# Patient Record
Sex: Female | Born: 1960 | Race: White | Hispanic: No | Marital: Married | Smoking: Never smoker
Health system: Southern US, Community
[De-identification: ages and names within clinical notes are randomized; demographics above are authoritative.]

## PROBLEM LIST (undated history)

## (undated) DIAGNOSIS — I10 Essential (primary) hypertension: Secondary | ICD-10-CM

## (undated) DIAGNOSIS — J121 Respiratory syncytial virus pneumonia: Secondary | ICD-10-CM

## (undated) DIAGNOSIS — E669 Obesity, unspecified: Secondary | ICD-10-CM

## (undated) DIAGNOSIS — M797 Fibromyalgia: Secondary | ICD-10-CM

## (undated) DIAGNOSIS — J9600 Acute respiratory failure, unspecified whether with hypoxia or hypercapnia: Secondary | ICD-10-CM

## (undated) DIAGNOSIS — R7303 Prediabetes: Secondary | ICD-10-CM

## (undated) DIAGNOSIS — K219 Gastro-esophageal reflux disease without esophagitis: Secondary | ICD-10-CM

## (undated) DIAGNOSIS — M1990 Unspecified osteoarthritis, unspecified site: Secondary | ICD-10-CM

## (undated) DIAGNOSIS — N951 Menopausal and female climacteric states: Secondary | ICD-10-CM

## (undated) DIAGNOSIS — K5090 Crohn's disease, unspecified, without complications: Secondary | ICD-10-CM

## (undated) DIAGNOSIS — E119 Type 2 diabetes mellitus without complications: Secondary | ICD-10-CM

## (undated) HISTORY — DX: Crohn's disease, unspecified, without complications: K50.90

## (undated) HISTORY — DX: Respiratory syncytial virus pneumonia: J12.1

## (undated) HISTORY — DX: Essential (primary) hypertension: I10

## (undated) HISTORY — DX: Acute respiratory failure, unspecified whether with hypoxia or hypercapnia: J96.00

## (undated) HISTORY — DX: Type 2 diabetes mellitus without complications: E11.9

## (undated) HISTORY — DX: Obesity, unspecified: E66.9

## (undated) HISTORY — PX: JOINT REPLACEMENT: SHX530

## (undated) HISTORY — DX: Menopausal and female climacteric states: N95.1

---

## 1983-02-18 HISTORY — PX: ANKLE ARTHROSCOPY: SUR85

## 2009-02-17 HISTORY — PX: COLONOSCOPY: SHX174

## 2010-01-16 LAB — HM COLONOSCOPY

## 2011-01-28 DIAGNOSIS — E6609 Other obesity due to excess calories: Secondary | ICD-10-CM | POA: Insufficient documentation

## 2011-01-28 DIAGNOSIS — I10 Essential (primary) hypertension: Secondary | ICD-10-CM | POA: Insufficient documentation

## 2011-07-29 DIAGNOSIS — E785 Hyperlipidemia, unspecified: Secondary | ICD-10-CM | POA: Insufficient documentation

## 2012-02-06 DIAGNOSIS — G4700 Insomnia, unspecified: Secondary | ICD-10-CM | POA: Insufficient documentation

## 2014-03-07 DIAGNOSIS — R232 Flushing: Secondary | ICD-10-CM | POA: Insufficient documentation

## 2014-03-07 DIAGNOSIS — K219 Gastro-esophageal reflux disease without esophagitis: Secondary | ICD-10-CM | POA: Insufficient documentation

## 2014-03-07 DIAGNOSIS — N951 Menopausal and female climacteric states: Secondary | ICD-10-CM | POA: Insufficient documentation

## 2017-06-04 ENCOUNTER — Ambulatory Visit (INDEPENDENT_AMBULATORY_CARE_PROVIDER_SITE_OTHER): Payer: Managed Care, Other (non HMO) | Admitting: Physician Assistant

## 2017-06-04 ENCOUNTER — Ambulatory Visit: Payer: Self-pay | Admitting: Physician Assistant

## 2017-06-04 ENCOUNTER — Encounter: Payer: Self-pay | Admitting: Physician Assistant

## 2017-06-04 VITALS — BP 136/91 | HR 95 | Resp 14 | Ht 64.0 in | Wt 234.5 lb

## 2017-06-04 DIAGNOSIS — Z7689 Persons encountering health services in other specified circumstances: Secondary | ICD-10-CM

## 2017-06-04 DIAGNOSIS — I10 Essential (primary) hypertension: Secondary | ICD-10-CM

## 2017-06-04 DIAGNOSIS — N951 Menopausal and female climacteric states: Secondary | ICD-10-CM | POA: Diagnosis not present

## 2017-06-04 MED ORDER — ASPIRIN EC 81 MG PO TBEC
81.0000 mg | DELAYED_RELEASE_TABLET | Freq: Every day | ORAL | 3 refills | Status: DC
Start: 2017-06-04 — End: 2020-08-10

## 2017-06-04 MED ORDER — ESTRADIOL-NORETHINDRONE ACET 0.5-0.1 MG PO TABS: 1.0000 | ORAL_TABLET | Freq: Every day | ORAL | 11 refills | Status: DC

## 2017-06-04 NOTE — Progress Notes (Signed)
HPI:                                                                Megan [name] is a 57 y.o. female who presents to [facility] today to establish care  Current concerns include: hot flashes and sleep disturbance  Patient reports vasomotor symptoms that are disruptive to her sleep. Additionally reports post-menopausal depression and weight gain. Menopause occurred at age 46-51. She has tried non-hormonal therapies in the past. Citalopram was initially helpful, but has stopped working. Gabapentin "made me moody and hateful."   Depression screen [facility] 2/9 06/04/2017  Decreased Interest 0  Down, Depressed, Hopeless 0  PHQ - 2 Score 0    No flowsheet data found.    Past Medical History:  Diagnosis Date  . Acute respiratory failure ([place])   . Menopausal vasomotor syndrome   . RSV (respiratory syncytial virus pneumonia)    Past Surgical History:  Procedure Laterality Date  . NO PAST SURGERIES     Social History   Tobacco Use  . Smoking status: Never Smoker  . Smokeless tobacco: Never Used  Substance Use Topics  . Alcohol use: Never    Frequency: Never   family history includes Breast cancer in her maternal grandmother; Heart disease in her father and mother; Hypertension in her mother.    ROS: Review of Systems  Constitutional: Positive for diaphoresis.  Psychiatric/Behavioral: Positive for depression. The patient has insomnia.   All other systems reviewed and are negative.    Medications: Current Outpatient Medications  Medication Sig Dispense Refill  . hydrochlorothiazide (HYDRODIURIL) 25 MG tablet Take 25 mg by mouth daily.    . pantoprazole (PROTONIX) 40 MG tablet Take 40 mg by mouth daily.    Marland Kitchen aspirin EC 81 MG tablet Take 1 tablet (81 mg total) by mouth daily. 90 tablet 3  . Estradiol-Norethindrone Acet 0.5-0.1 MG tablet Take 1 tablet by mouth daily. 30 tablet 11   No current facility-administered medications  for this visit.    Not on File     Objective:  BP (!) 136/91   Pulse 95   Ht 5' 4"  (1.626 m)   Wt 234 lb 8 oz (106.4 kg)   SpO2 93%   BMI 40.25 kg/m  Gen:  alert, not ill-appearing, no distress, appropriate for age, obese female HEENT: head normocephalic without obvious abnormality, conjunctiva and cornea clear, trachea midline Pulm: Normal work of breathing, normal phonation, clear to auscultation bilaterally, no wheezes, rales or rhonchi CV: Normal rate, regular rhythm, s1 and s2 distinct, no murmurs, clicks or rubs  Neuro: alert and oriented x 3, no tremor MSK: extremities atraumatic, normal gait and station Skin: intact, no rashes on exposed skin, no jaundice, no cyanosis Psych: well-groomed, cooperative, good eye contact, euthymic mood, affect mood-congruent, speech is articulate, and thought processes clear and goal-directed    No results found for this or any previous visit (from the past 72 hour(s)). No results found.    Assessment and Plan: 57 y.o. female with   1. Encounter to establish care - Personally reviewed PMH, PSH, PFH, medications, allergies, HM - Age-appropriate cancer screening: mammogram UTD to be abstracted, pap UTD per patient awaiting records, colonoscopy UTD per patient awaiting records -  Influenza n/a - Tdap declined today - PHQ2 negative   2. Menopausal vasomotor syndrome - patient has failed off-label pharmacologic therapy with Citalopram and Gabapentin. discussed treatment options to include HRT, bio-identical hormones (referral), or alternative medications such as Effexor. Patient opted for HRT. Discussed risks and benefits. Mammogram UTD, no personal hx of breast cancer. No hx of VTE, liver disease or AUB - starting continuous HRT with Activella. Starting low-dose and will assess response in 3 months. Plan to d/c age 21 or sooner if needed - discussed lifestyle/non-pharmacologic management of menopausal syndrome and handout provided  3.  Essential hypertension BP Readings from Last 3 Encounters:  06/04/17 (!) 136/91  - BP out of range today - cont HCTZ 25 mg and monitor BP at home - counseled on therapeutic lifestyle changes - reassess in 3 months. Will add ACE/ARB if not at goal   Patient education and anticipatory guidance given Patient agrees with treatment plan Follow-up in 3 months or sooner as needed if symptoms worsen or fail to improve  Darlyne Russian PA-C

## 2017-06-04 NOTE — Patient Instructions (Signed)
For your blood pressure: - Goal <130/80 - baby aspirin 81 mg daily to help prevent heart attack/stroke - monitor and log blood pressures at home - check around the same time each day in a relaxed setting - Limit salt to <2000 mg/day - Follow DASH eating plan - limit alcohol to 2 standard drinks per day for men and 1 per day for women - avoid tobacco products - weight loss: 7% of current body weight - follow-up every 6 months for your blood pressure

## 2017-06-15 ENCOUNTER — Encounter: Payer: Self-pay | Admitting: Physician Assistant

## 2017-06-15 ENCOUNTER — Ambulatory Visit (INDEPENDENT_AMBULATORY_CARE_PROVIDER_SITE_OTHER): Payer: Managed Care, Other (non HMO) | Admitting: Physician Assistant

## 2017-06-15 VITALS — BP 135/88 | HR 90 | Wt 232.0 lb

## 2017-06-15 DIAGNOSIS — F325 Major depressive disorder, single episode, in full remission: Secondary | ICD-10-CM

## 2017-06-15 DIAGNOSIS — F419 Anxiety disorder, unspecified: Secondary | ICD-10-CM

## 2017-06-15 DIAGNOSIS — F411 Generalized anxiety disorder: Secondary | ICD-10-CM | POA: Diagnosis not present

## 2017-06-15 DIAGNOSIS — F329 Major depressive disorder, single episode, unspecified: Secondary | ICD-10-CM | POA: Insufficient documentation

## 2017-06-15 DIAGNOSIS — F32A Depression, unspecified: Secondary | ICD-10-CM | POA: Insufficient documentation

## 2017-06-15 DIAGNOSIS — F322 Major depressive disorder, single episode, severe without psychotic features: Secondary | ICD-10-CM | POA: Insufficient documentation

## 2017-06-15 HISTORY — DX: Major depressive disorder, single episode, in full remission: F32.5

## 2017-06-15 MED ORDER — ALPRAZOLAM 0.25 MG PO TABS: 0.2500 mg | ORAL_TABLET | Freq: Two times a day (BID) | ORAL | 0 refills | Status: DC | PRN

## 2017-06-15 MED ORDER — SERTRALINE HCL 50 MG PO TABS: 50.0000 mg | ORAL_TABLET | Freq: Every day | ORAL | 3 refills | Status: DC

## 2017-06-15 NOTE — Progress Notes (Signed)
HPI:                                                                Megan [name] is a 57 y.o. female who presents to [facility] today for anxiety  [name] reports depression with suicidal ideation, gradually worsening over the last week. She reports feeling like her family would be better off without her. She had these thoughts beginning last night and told her wife. She denies forming a plan. No history of self-harm. Denies alcohol or substance use.   She denies history of depression. She has been feeling a change in mood ever since starting menopause. She endorses anhedonia and distancing herself from loved ones. Wife reports she has noticed a huge change and encouraged her to come in today.    Depression screen [facility] 2/9 06/15/2017 06/04/2017  Decreased Interest 3 0  Down, Depressed, Hopeless 3 0  PHQ - 2 Score 6 0  Altered sleeping 1 -  Tired, decreased energy 3 -  Change in appetite 3 -  Feeling bad or failure about yourself  3 -  Trouble concentrating 3 -  Moving slowly or fidgety/restless 1 -  Suicidal thoughts 3 -  PHQ-9 Score 23 -  Difficult doing work/chores Extremely dIfficult -    GAD 7 : Generalized Anxiety Score 06/15/2017  Nervous, Anxious, on Edge 3  Control/stop worrying 3  Worry too much - different things 3  Trouble relaxing 3  Restless 3  Easily annoyed or irritable 3  Afraid - awful might happen 3  Total GAD 7 Score 21  Anxiety Difficulty Extremely difficult      Past Medical History:  Diagnosis Date  . Acute respiratory failure ([place])   . Hypertension   . Menopausal vasomotor syndrome   . Obesity   . RSV (respiratory syncytial virus pneumonia)    Past Surgical History:  Procedure Laterality Date  . NO PAST SURGERIES     Social History   Tobacco Use  . Smoking status: Never Smoker  . Smokeless tobacco: Never Used  Substance Use Topics  . Alcohol use: Never    Frequency: Never   family  history includes Breast cancer in her maternal grandmother; Heart disease in her father and mother; Hypertension in her mother.    ROS: negative except as noted in the HPI  Medications: Current Outpatient Medications  Medication Sig Dispense Refill  . aspirin EC 81 MG tablet Take 1 tablet (81 mg total) by mouth daily. 90 tablet 3  . Estradiol-Norethindrone Acet 0.5-0.1 MG tablet Take 1 tablet by mouth daily. 30 tablet 11  . hydrochlorothiazide (HYDRODIURIL) 25 MG tablet Take 25 mg by mouth daily.    . pantoprazole (PROTONIX) 40 MG tablet Take 40 mg by mouth daily.    Marland Kitchen ALPRAZolam (XANAX) 0.25 MG tablet Take 1 tablet (0.25 mg total) by mouth 2 (two) times daily as needed for anxiety. 20 tablet 0  . sertraline (ZOLOFT) 50 MG tablet Take 1 tablet (50 mg total) by mouth at bedtime. 30 tablet 3   No current facility-administered medications for this visit.    No Known Allergies     Objective:  BP 135/88   Pulse 90   Wt 232 lb (105.2 kg)  BMI 39.82 kg/m  Gen:  alert, not ill-appearing, no distress, appropriate for age 57: head normocephalic without obvious abnormality, conjunctiva and cornea clear, trachea midline Pulm: Normal work of breathing, normal phonation Neuro: alert and oriented x 3, no tremor MSK: extremities atraumatic, normal gait and station Skin: intact, no rashes on exposed skin, no jaundice, no cyanosis Psych: well-groomed, cooperative, good eye contact, depressed mood, affect mood-congruent, tearful, psychomotor agitiation, speech is articulate, and thought processes clear and goal-directed    No results found for this or any previous visit (from the past 72 hour(s)). No results found.    Assessment and Plan: 57 y.o. female with    Current severe episode of major depressive disorder without psychotic features without prior episode ([place]) - PHQ9=23, severe, passive SI, no AH/VH, no acute safety issues. Patient verbally contracted for safety. Safety  planned reviewed. She has a good support system and lives at home with wife and daughter. - starting Sertraline 25 mg QHS x 3 days, self-titrate to 50 mg QHS - low-dose alprazolam prn for anxiety/panic attacks   Current severe episode of major depressive disorder without psychotic features without prior episode ([place]) - Plan: sertraline (ZOLOFT) 50 MG tablet  Anxiety state - Plan: sertraline (ZOLOFT) 50 MG tablet, ALPRAZolam (XANAX) 0.25 MG tablet    Patient education and anticipatory guidance given Patient agrees with treatment plan Follow-up in 1 week or sooner as needed if symptoms worsen or fail to improve  I spent 25 minutes with this patient, greater than 50% was face-to-face time counseling regarding the above diagnoses   Darlyne Russian PA-C

## 2017-06-15 NOTE — Patient Instructions (Addendum)
-   start Sertraline 1/2 tablet at bedtime for 3 days, then full tablet at bedtime - take Xanax 1 tablet every 8 hours as needed for anxiety/panic attacks   Safety Plan: if having self-harm or suicidal thoughts 1. [place]  2. [place]  3. Evlyn Clines [phone] [place] National Suicide Hotline 1-800-SUICIDE If in immediate danger of harming yourself, go to the nearest emergency room or call 911    Counseling: - psychologytoday.com: search engine to locate local counselors - Family Services in your county offer counseling on a sliding scale (pay what you can afford) - [facility]: we can place a referral for you to see one of licensed counselors in [place], [place], or [place] - online counseling: [facility] (not covered by insurance, but affordable self-pay rates)  Other resources: - https://www.[place].net/ - [employer]cup[employer]a - ArmyDictionary.fi

## 2017-06-19 ENCOUNTER — Telehealth: Payer: Self-pay | Admitting: Physician Assistant

## 2017-06-19 NOTE — Telephone Encounter (Signed)
Spoke with Megan [name] at 11:43 am today. Reports she is feeling significantly better. She is taking Sertraline nightly without issues. Denies suicidal ideation. Requesting to move her follow-up appointment forward. Transferred to reschedule for 2-3 weeks.

## 2017-06-22 ENCOUNTER — Ambulatory Visit: Payer: Managed Care, Other (non HMO) | Admitting: Physician Assistant

## 2017-06-26 ENCOUNTER — Telehealth: Payer: Self-pay | Admitting: Physician Assistant

## 2017-06-26 NOTE — Telephone Encounter (Signed)
Please ask Megan [name] if she is taking her Protonix for acid reflux/GERD? She should really only be on the 52m dose for acid reflux. High dose is reserved for barrett's esophagus, esophagitis, gastritis and ulcers  I'm okay with refilling 20 mg #90 1 refill

## 2017-06-26 NOTE — Telephone Encounter (Signed)
Please advise -EH/RMA

## 2017-06-29 NOTE — Telephone Encounter (Signed)
Patient should attempt to reduce dose to 20 mg, she can do 1 tablet at bedtime or before breakfast. Adhere to GERD diet Recommend against using high doses long-term  We can discuss at her next follow-up visit Please send 20 mg #90, 1 refill

## 2017-06-29 NOTE — Telephone Encounter (Signed)
Pt stated that she has GERD throughout the day and it can become worse at night.  She said that her previous PCP said for her to take 1 in the morning and 1 at bedtime.  She said this dose seemed to help.  Please advise. -EH/RMA

## 2017-06-30 ENCOUNTER — Other Ambulatory Visit: Payer: Self-pay

## 2017-06-30 DIAGNOSIS — F411 Generalized anxiety disorder: Secondary | ICD-10-CM

## 2017-06-30 MED ORDER — PANTOPRAZOLE SODIUM 20 MG PO TBEC: 20.0000 mg | DELAYED_RELEASE_TABLET | Freq: Every day | ORAL | 1 refills | Status: DC

## 2017-06-30 NOTE — Telephone Encounter (Signed)
Pt notified. New Rx sent to pharmacy [facility]

## 2017-07-06 ENCOUNTER — Ambulatory Visit: Payer: Managed Care, Other (non HMO) | Admitting: Physician Assistant

## 2017-07-06 ENCOUNTER — Encounter: Payer: Self-pay | Admitting: Physician Assistant

## 2017-07-06 ENCOUNTER — Telehealth: Payer: Self-pay | Admitting: Physician Assistant

## 2017-07-06 VITALS — BP 128/85 | HR 80 | Wt 232.0 lb

## 2017-07-06 DIAGNOSIS — F325 Major depressive disorder, single episode, in full remission: Secondary | ICD-10-CM | POA: Diagnosis not present

## 2017-07-06 DIAGNOSIS — Z5181 Encounter for therapeutic drug level monitoring: Secondary | ICD-10-CM | POA: Diagnosis not present

## 2017-07-06 DIAGNOSIS — Z79899 Other long term (current) drug therapy: Secondary | ICD-10-CM | POA: Diagnosis not present

## 2017-07-06 DIAGNOSIS — K219 Gastro-esophageal reflux disease without esophagitis: Secondary | ICD-10-CM | POA: Diagnosis not present

## 2017-07-06 DIAGNOSIS — Z1159 Encounter for screening for other viral diseases: Secondary | ICD-10-CM | POA: Diagnosis not present

## 2017-07-06 DIAGNOSIS — Z13220 Encounter for screening for lipoid disorders: Secondary | ICD-10-CM

## 2017-07-06 DIAGNOSIS — Z1329 Encounter for screening for other suspected endocrine disorder: Secondary | ICD-10-CM | POA: Diagnosis not present

## 2017-07-06 DIAGNOSIS — Z130 Encounter for screening for diseases of the blood and blood-forming organs and certain disorders involving the immune mechanism: Secondary | ICD-10-CM | POA: Diagnosis not present

## 2017-07-06 DIAGNOSIS — I10 Essential (primary) hypertension: Secondary | ICD-10-CM | POA: Diagnosis not present

## 2017-07-06 MED ORDER — HYDROCHLOROTHIAZIDE 25 MG PO TABS: 25.0000 mg | ORAL_TABLET | Freq: Every day | ORAL | 1 refills | Status: DC

## 2017-07-06 MED ORDER — SERTRALINE HCL 50 MG PO TABS: 50.0000 mg | ORAL_TABLET | Freq: Every day | ORAL | 1 refills | Status: DC

## 2017-07-06 MED ORDER — DEXLANSOPRAZOLE 30 MG PO CPDR: 30.0000 mg | DELAYED_RELEASE_CAPSULE | Freq: Every day | ORAL | 5 refills | Status: DC

## 2017-07-06 NOTE — Patient Instructions (Addendum)
- switch Pantoprazole dosing to bedtime - try elevating the head of the bed at night - you can try Gaviscon for breakthrough symptoms (if TUMS is not cutting it) - follow GERD diet  Food Choices for Gastroesophageal Reflux Disease, Adult When you have gastroesophageal reflux disease (GERD), the foods you eat and your eating habits are very important. Choosing the right foods can help ease the discomfort of GERD. Consider working with a diet and nutrition specialist (dietitian) to help you make healthy food choices. What general guidelines should I follow? Eating plan  Choose healthy foods low in fat, such as fruits, vegetables, whole grains, low-fat dairy products, and lean meat, fish, and poultry.  Eat frequent, small meals instead of three large meals each day. Eat your meals slowly, in a relaxed setting. Avoid bending over or lying down until 2-3 hours after eating.  Limit high-fat foods such as fatty meats or fried foods.  Limit your intake of oils, butter, and shortening to less than 8 teaspoons each day.  Avoid the following: ? Foods that cause symptoms. These may be different for different people. Keep a food diary to keep track of foods that cause symptoms. ? Alcohol. ? Drinking large amounts of liquid with meals. ? Eating meals during the 2-3 hours before bed.  Cook foods using methods other than frying. This may include baking, grilling, or broiling. Lifestyle   Maintain a healthy weight. Ask your health care provider what weight is healthy for you. If you need to lose weight, work with your health care provider to do so safely.  Exercise for at least 30 minutes on 5 or more days each week, or as told by your health care provider.  Avoid wearing clothes that fit tightly around your waist and chest.  Do not use any products that contain nicotine or tobacco, such as cigarettes and e-cigarettes. If you need help quitting, ask your health care provider.  Sleep with the head  of your bed raised. Use a wedge under the mattress or blocks under the bed frame to raise the head of the bed. What foods are not recommended? The items listed may not be a complete list. Talk with your dietitian about what dietary choices are best for you. Grains Pastries or quick breads with added fat. [place] toast. Vegetables Deep fried vegetables. [place] fries. Any vegetables prepared with added fat. Any vegetables that cause symptoms. For some people this may include tomatoes and tomato products, chili peppers, onions and garlic, and horseradish. Fruits Any fruits prepared with added fat. Any fruits that cause symptoms. For some people this may include citrus fruits, such as oranges, grapefruit, pineapple, and lemons. Meats and other protein foods High-fat meats, such as fatty beef or pork, hot dogs, ribs, ham, sausage, salami and bacon. Fried meat or protein, including fried fish and fried chicken. Nuts and nut butters. Dairy Whole milk and chocolate milk. Sour cream. Cream. Ice cream. Cream cheese. Milk shakes. Beverages Coffee and tea, with or without caffeine. Carbonated beverages. Sodas. Energy drinks. Fruit juice made with acidic fruits (such as orange or grapefruit). Tomato juice. Alcoholic drinks. Fats and oils Butter. Margarine. Shortening. Ghee. Sweets and desserts Chocolate and cocoa. Donuts. Seasoning and other foods Pepper. Peppermint and spearmint. Any condiments, herbs, or seasonings that cause symptoms. For some people, this may include curry, hot sauce, or vinegar-based salad dressings. Summary  When you have gastroesophageal reflux disease (GERD), food and lifestyle choices are very important to help ease the discomfort of  GERD.  Eat frequent, small meals instead of three large meals each day. Eat your meals slowly, in a relaxed setting. Avoid bending over or lying down until 2-3 hours after eating.  Limit high-fat foods such as fatty meat or fried foods. This  information is not intended to replace advice given to you by your health care provider. Make sure you discuss any questions you have with your health care provider. Document Released: 02/03/2005 Document Revised: 02/05/2016 Document Reviewed: 02/05/2016 Elsevier Interactive Patient Education  [employer].

## 2017-07-06 NOTE — Progress Notes (Signed)
HPI:                                                                Megan [name] is a 57 y.o. female who presents to [facility] [place] [facility] today for depression follow-up  Depression/Anxiety: taking sertraline 50 mg without difficulty. Has not need to take any Xanax. Symptoms are well controlled. Reports mood swings have resolved and she is no longer feeling depressed. Denies symptoms of mania/hypomania. Denies suicidal thinking. Denies auditory/visual hallucinations.  Menopausal vasomotor symptoms: has been taking oral estradiol-norethindrone daily for approximately 4 weeks. Denies any hot flashes/nightsweats. Sleeping much better.  GERD: she has been taking Protonix 40 mg daily for years for acid reflux. This has controlled her symptoms well. Recently I reduced her dose to 20 mg daily. Since then she reports burning epigastric pain and regurgitation of stomach acids, worse lying down at night. Taking TUMS throughout the day for breakthrough symptoms. Reports she had a normal EGD years ago. Denies constitutional symptoms, nausea, forceful vomiting, hematochezia/melena.  Depression screen [facility] 2/9 07/06/2017 06/15/2017 06/04/2017  Decreased Interest 0 3 0  Down, Depressed, Hopeless 0 3 0  PHQ - 2 Score 0 6 0  Altered sleeping 0 1 -  Tired, decreased energy 0 3 -  Change in appetite 0 3 -  Feeling bad or failure about yourself  0 3 -  Trouble concentrating 0 3 -  Moving slowly or fidgety/restless 0 1 -  Suicidal thoughts 0 3 -  PHQ-9 Score 0 23 -  Difficult doing work/chores Not difficult at all Extremely dIfficult -    GAD 7 : Generalized Anxiety Score 07/06/2017 06/15/2017  Nervous, Anxious, on Edge 0 3  Control/stop worrying 0 3  Worry too much - different things 0 3  Trouble relaxing 0 3  Restless 0 3  Easily annoyed or irritable 0 3  Afraid - awful might happen 0 3  Total GAD 7 Score 0 21  Anxiety Difficulty Not difficult at all Extremely  difficult      Past Medical History:  Diagnosis Date  . Acute respiratory failure ([place])   . Hypertension   . Menopausal vasomotor syndrome   . Obesity   . RSV (respiratory syncytial virus pneumonia)    Past Surgical History:  Procedure Laterality Date  . NO PAST SURGERIES     Social History   Tobacco Use  . Smoking status: Never Smoker  . Smokeless tobacco: Never Used  Substance Use Topics  . Alcohol use: Never    Frequency: Never   family history includes Breast cancer in her maternal grandmother; Heart disease in her father and mother; Hypertension in her mother.    ROS: negative except as noted in the HPI  Medications: Current Outpatient Medications  Medication Sig Dispense Refill  . aspirin EC 81 MG tablet Take 1 tablet (81 mg total) by mouth daily. 90 tablet 3  . Estradiol-Norethindrone Acet 0.5-0.1 MG tablet Take 1 tablet by mouth daily. 30 tablet 11  . hydrochlorothiazide (HYDRODIURIL) 25 MG tablet Take 1 tablet (25 mg total) by mouth daily. 90 tablet 1  . pantoprazole (PROTONIX) 20 MG tablet Take 1 tablet (20 mg total) by mouth daily. 90 tablet 1  . sertraline (ZOLOFT) 50 MG  tablet Take 1 tablet (50 mg total) by mouth at bedtime. 90 tablet 1  . Dexlansoprazole 30 MG capsule Take 1 capsule (30 mg total) by mouth daily. 30 capsule 5   No current facility-administered medications for this visit.    No Known Allergies     Objective:  BP 128/85   Pulse 80   Wt 232 lb (105.2 kg)   BMI 39.82 kg/m  Gen:  alert, not ill-appearing, no distress, appropriate for age, obese female HEENT: head normocephalic without obvious abnormality, conjunctiva and cornea clear, wearing glasses, trachea midline Pulm: Normal work of breathing, normal phonation Neuro: alert and oriented x 3, no tremor MSK: extremities atraumatic, normal gait and station Skin: intact, no rashes on exposed skin, no jaundice, no cyanosis Psych: well-groomed, cooperative, good eye contact, euthymic  mood, affect mood-congruent, speech is articulate, and thought processes clear and goal-directed    No results found for this or any previous visit (from the past 72 hour(s)). No results found.    Assessment and Plan: 57 y.o. female with   Major depressive disorder with single episode, in full remission ([place]) - Plan: sertraline (ZOLOFT) 50 MG tablet  Gastroesophageal reflux disease without esophagitis - Plan: Dexlansoprazole 30 MG capsule  Essential hypertension - Plan: hydrochlorothiazide (HYDRODIURIL) 25 MG tablet, COMPLETE METABOLIC PANEL WITH GFR  Encounter for hepatitis C screening test for low risk patient - Plan: Hepatitis C antibody  Screening for thyroid disorder - Plan: TSH + free T4  Screening for lipid disorders - Plan: Lipid Panel w/reflex Direct LDL  Encounter for monitoring diuretic therapy - Plan: COMPLETE METABOLIC PANEL WITH GFR, CBC  Screening for blood disease - Plan: CBC  MDD in remission - PHQ9=0 today - she had a great response to Sertraline 50 mg. She also started HRT around the same time. We will continue both  GERD - we had a detailed discussion about avoiding long-term high dose PPI use for GERD/heartburn. She is not having any red flag symptoms, but she is not controlled on Protonix 20 mg. Prior to this, she failed Omeprazole. Discussed that we will trial Dexilant. If symptoms are not controlled, then I will refer her to GI - GERD diet and general measures  HTN BP Readings from Last 3 Encounters:  07/06/17 128/85  06/15/17 135/88  06/04/17 (!) 937/90  - diastolic BP still out of range - cont HCTZ 25 mg - counseled on therapeutic lifestyle changes. Monitor BP at home. Follow-up in 3 months   Patient education and anticipatory guidance given Patient agrees with treatment plan Follow-up in 3 months for medication management or sooner as needed if symptoms worsen or fail to improve  Darlyne Russian PA-C

## 2017-07-06 NOTE — Telephone Encounter (Signed)
Can you contact Megan [name] and have her go to the lab one day this week for routine labs? I refilled her blood pressure medicine, but I would like to monitor her kidney function and electrolytes Order Req is at the front desk

## 2017-07-06 NOTE — Telephone Encounter (Signed)
Pt advised.

## 2017-07-08 ENCOUNTER — Other Ambulatory Visit: Payer: Self-pay | Admitting: Physician Assistant

## 2017-07-08 ENCOUNTER — Encounter: Payer: Self-pay | Admitting: Physician Assistant

## 2017-07-08 DIAGNOSIS — R739 Hyperglycemia, unspecified: Secondary | ICD-10-CM

## 2017-07-08 NOTE — Progress Notes (Signed)
A1C was added. W.Khayden Herzberg, CCMA

## 2017-07-08 NOTE — Progress Notes (Signed)
Good afternoon Megan [name],  Your labs look good overall! - your blood sugar is mildly increased, this could be due to not fasting or it could signal early insulin resistance/ pre-diabetes. Recommend we get a routine hemoglobin A1c test at your next office visit. Also recommend limiting carbohydrates to 30 g per meal and 15 g per snack, regular aerobic exercise, and weight loss.  Best, Evlyn Clines

## 2017-07-09 ENCOUNTER — Encounter: Payer: Self-pay | Admitting: Physician Assistant

## 2017-07-09 DIAGNOSIS — R7303 Prediabetes: Secondary | ICD-10-CM | POA: Insufficient documentation

## 2017-07-09 DIAGNOSIS — N939 Abnormal uterine and vaginal bleeding, unspecified: Secondary | ICD-10-CM

## 2017-07-09 LAB — HEPATITIS C ANTIBODY
HEP C AB: NONREACTIVE
SIGNAL TO CUT-OFF: 0.01 (ref <1.00)

## 2017-07-09 LAB — CBC
HCT: 37.6 % (ref 35.0–45.0)
HEMOGLOBIN: 12.3 g/dL (ref 11.7–15.5)
MCH: 26.4 pg — AB (ref 27.0–33.0)
MCHC: 32.7 g/dL (ref 32.0–36.0)
MCV: 80.7 fL (ref 80.0–100.0)
MPV: 11.8 fL (ref 7.5–12.5)
Platelets: 228 10*3/uL (ref 140–400)
RBC: 4.66 10*6/uL (ref 3.80–5.10)
RDW: 14.2 % (ref 11.0–15.0)
WBC: 4.4 10*3/uL (ref 3.8–10.8)

## 2017-07-09 LAB — TEST AUTHORIZATION

## 2017-07-09 LAB — COMPLETE METABOLIC PANEL WITH GFR
AG Ratio: 1.5 (calc) (ref 1.0–2.5)
ALKALINE PHOSPHATASE (APISO): 77 U/L (ref 33–130)
ALT: 18 U/L (ref 6–29)
AST: 19 U/L (ref 10–35)
Albumin: 4.1 g/dL (ref 3.6–5.1)
BUN: 14 mg/dL (ref 7–25)
CALCIUM: 9.3 mg/dL (ref 8.6–10.4)
CO2: 29 mmol/L (ref 20–32)
CREATININE: 0.76 mg/dL (ref 0.50–1.05)
Chloride: 100 mmol/L (ref 98–110)
GFR, EST NON AFRICAN AMERICAN: 87 mL/min/{1.73_m2} (ref >=60)
GFR, Est African American: 101 mL/min/{1.73_m2} (ref >=60)
GLOBULIN: 2.8 g/dL (ref 1.9–3.7)
GLUCOSE: 107 mg/dL — AB (ref 65–99)
Potassium: 3.7 mmol/L (ref 3.5–5.3)
SODIUM: 138 mmol/L (ref 135–146)
Total Bilirubin: 0.5 mg/dL (ref 0.2–1.2)
Total Protein: 6.9 g/dL (ref 6.1–8.1)

## 2017-07-09 LAB — TSH+FREE T4: TSH W/REFLEX TO FT4: 1.7 mIU/L (ref 0.40–4.50)

## 2017-07-09 LAB — LIPID PANEL W/REFLEX DIRECT LDL
Cholesterol: 211 mg/dL — ABNORMAL HIGH (ref <200)
HDL: 71 mg/dL (ref >50)
LDL Cholesterol (Calc): 106 mg/dL (calc) — ABNORMAL HIGH
NON-HDL CHOLESTEROL (CALC): 140 mg/dL — AB (ref <130)
TRIGLYCERIDES: 217 mg/dL — AB (ref <150)
Total CHOL/HDL Ratio: 3 (calc) (ref <5.0)

## 2017-07-09 LAB — HEMOGLOBIN A1C W/OUT EAG: HEMOGLOBIN A1C: 5.8 %{Hb} — AB (ref <5.7)

## 2017-07-09 NOTE — Progress Notes (Signed)
Hi Megan [name],  I was able to add-on a Hemoglobin-A1c test to your existing bloodwork. You do have pre-diabetes - this indicates insulin resistance in the body and increases risk of developing Type 2 Diabetes. I recommend - following dietary guidelines of the American Diabetes Association (ADA) - limit carbs to 30g per meal and 15g per snack - avoid sweetened beverages and simple carbs like sugar, white bread and white pasta - regular aerobic exercise - losing 5% of current body weight  Best, Evlyn Clines

## 2017-07-16 MED ORDER — MEDROXYPROGESTERONE ACETATE 10 MG PO TABS: 10.0000 mg | ORAL_TABLET | Freq: Two times a day (BID) | ORAL | 0 refills | Status: DC

## 2017-07-17 ENCOUNTER — Ambulatory Visit (INDEPENDENT_AMBULATORY_CARE_PROVIDER_SITE_OTHER): Payer: Managed Care, Other (non HMO)

## 2017-07-17 DIAGNOSIS — N939 Abnormal uterine and vaginal bleeding, unspecified: Secondary | ICD-10-CM | POA: Diagnosis not present

## 2017-07-17 DIAGNOSIS — D259 Leiomyoma of uterus, unspecified: Secondary | ICD-10-CM | POA: Insufficient documentation

## 2017-07-17 NOTE — Progress Notes (Unsigned)
Spoke with patient on the phone regarding pelvic ultrasound results showing fibroid tumor Referring to OB/GYN to discuss surgical options Continue full 5 days of Provera Hold HRT for now until eval with GYN

## 2017-07-24 ENCOUNTER — Other Ambulatory Visit: Payer: Self-pay | Admitting: Physician Assistant

## 2017-07-24 DIAGNOSIS — N939 Abnormal uterine and vaginal bleeding, unspecified: Secondary | ICD-10-CM

## 2017-07-26 ENCOUNTER — Encounter: Payer: Self-pay | Admitting: Obstetrics & Gynecology

## 2017-07-27 ENCOUNTER — Ambulatory Visit: Payer: 59 | Admitting: Obstetrics & Gynecology

## 2017-07-27 ENCOUNTER — Encounter: Payer: Self-pay | Admitting: Physician Assistant

## 2017-07-27 ENCOUNTER — Encounter: Payer: Self-pay | Admitting: Obstetrics & Gynecology

## 2017-07-27 VITALS — BP 122/81 | HR 93 | Resp 16 | Ht 64.0 in | Wt 223.0 lb

## 2017-07-27 DIAGNOSIS — Z124 Encounter for screening for malignant neoplasm of cervix: Secondary | ICD-10-CM | POA: Diagnosis not present

## 2017-07-27 DIAGNOSIS — N950 Postmenopausal bleeding: Secondary | ICD-10-CM

## 2017-07-27 DIAGNOSIS — Z1151 Encounter for screening for human papillomavirus (HPV): Secondary | ICD-10-CM | POA: Diagnosis not present

## 2017-07-27 DIAGNOSIS — Z01419 Encounter for gynecological examination (general) (routine) without abnormal findings: Secondary | ICD-10-CM

## 2017-07-27 MED ORDER — ALPRAZOLAM ER 1 MG PO TB24: ORAL_TABLET | ORAL | 0 refills | Status: DC

## 2017-07-27 MED ORDER — MEGESTROL ACETATE 40 MG PO TABS: 40.0000 mg | ORAL_TABLET | Freq: Two times a day (BID) | ORAL | 5 refills | Status: DC

## 2017-07-27 MED ORDER — MISOPROSTOL 200 MCG PO TABS: ORAL_TABLET | ORAL | 0 refills | Status: DC

## 2017-07-27 NOTE — Progress Notes (Signed)
Subjective:    Megan [name] is a 57 y.o. married P0  female who presents for an annual exam. She reports heavy bleeding since Friday (3 days ago). She was given Activella 4/19. She stopped the Mora when she started PMB and tried provera.  She reports menopause around age 39. The patient is sexually active. GYN screening history: last pap: was normal. The patient wears seatbelts: yes. The patient participates in regular exercise: yes. Has the patient ever been transfused or tattooed?: no. The patient reports that there is not domestic violence in her life.   Menstrual History: OB History    Gravida  0   Para  0   Term  0   Preterm  0   AB  0   Living  0     SAB  0   TAB  0   Ectopic  0   Multiple  0   Live Births  0           Menarche age: 89 No LMP recorded. Patient is postmenopausal.    The following portions of the patient's history were reviewed and updated as appropriate: allergies, current medications, past family history, past medical history, past social history, past surgical history and problem list.  Review of Systems Pertinent items are noted in HPI.   She had a mammo 12/18 through [facility] and it was normal. 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) FH- + breast cancer in maternal GM + cervical cancer in her sister   Objective:    BP 122/81   Pulse 93   Resp 16   Ht 5' 4"  (1.626 m)   Wt 223 lb (101.2 kg)   BMI 38.28 kg/m   General Appearance:    Alert, cooperative, no distress, appears stated age  Head:    Normocephalic, without obvious abnormality, atraumatic  Eyes:    PERRL, conjunctiva/corneas clear, EOM's intact, fundi    benign, both eyes  Ears:    Normal TM's and external ear canals, both ears  Nose:   Nares normal, septum midline, mucosa normal, no drainage    or sinus tenderness  Throat:   Lips,  mucosa, and tongue normal; teeth and gums normal  Neck:   Supple, symmetrical, trachea midline, no adenopathy;    thyroid:  no enlargement/tenderness/nodules; no carotid   bruit or JVD  Back:     Symmetric, no curvature, ROM normal, no CVA tenderness  Lungs:     Clear to auscultation bilaterally, respirations unlabored  Chest Wall:    No tenderness or deformity   Heart:    Regular rate and rhythm, S1 and S2 normal, no murmur, rub   or gallop  Breast Exam:    No tenderness, masses, or nipple abnormality  Abdomen:     Soft, non-tender, bowel sounds active all four quadrants,    no masses, no organomegaly  Genitalia:    Normal female without lesion, discharge or tenderness, normal size and shape, anteverted, mobile, non-tender, normal adnexal exam, small amount of blood noted      Extremities:   Extremities normal, atraumatic, no cyanosis or edema  Pulses:   2+ and symmetric all extremities  Skin:   Skin color, texture, turgor normal, no rashes or lesions  Lymph nodes:   Cervical, supraclavicular, and axillary nodes normal  Neurologic:   CNII-XII intact, normal strength,  sensation and reflexes    throughout  .    Assessment:    Healthy female exam.   PMB with 7 mm endomtrium  nocturia x 1-2 times per night ( she does not drink anything after 4 pm)- rec voiding diary, urol ref prn Plan:     Thin prep Pap smear. with cotesting Schedule EMBX, pretreat with cytotec and xanax

## 2017-07-30 LAB — CYTOLOGY - PAP
Diagnosis: NEGATIVE
HPV: NOT DETECTED

## 2017-08-03 ENCOUNTER — Other Ambulatory Visit: Payer: Self-pay | Admitting: Obstetrics & Gynecology

## 2017-08-03 ENCOUNTER — Encounter: Payer: Self-pay | Admitting: Obstetrics & Gynecology

## 2017-08-11 ENCOUNTER — Encounter: Payer: Self-pay | Admitting: Obstetrics & Gynecology

## 2017-08-12 ENCOUNTER — Encounter: Payer: Self-pay | Admitting: Obstetrics & Gynecology

## 2017-08-12 ENCOUNTER — Other Ambulatory Visit: Payer: Self-pay

## 2017-08-12 ENCOUNTER — Encounter (HOSPITAL_BASED_OUTPATIENT_CLINIC_OR_DEPARTMENT_OTHER): Payer: Self-pay | Admitting: *Deleted

## 2017-08-12 ENCOUNTER — Ambulatory Visit: Payer: Managed Care, Other (non HMO) | Admitting: Obstetrics & Gynecology

## 2017-08-12 VITALS — BP 128/89 | HR 84 | Ht 64.0 in | Wt 222.0 lb

## 2017-08-12 DIAGNOSIS — N950 Postmenopausal bleeding: Secondary | ICD-10-CM | POA: Diagnosis not present

## 2017-08-12 MED ORDER — MISOPROSTOL 200 MCG PO TABS: ORAL_TABLET | ORAL | 0 refills | Status: DC

## 2017-08-12 NOTE — Progress Notes (Signed)
   Subjective:    Patient ID: Megan [name], female    DOB: 1960/05/21, 57 y.o.   MRN: [id number]  HPI 57 yo married P0 here for a EMBX due to PMB.    Review of Systems     Objective:   Physical Exam Breathing, conversing, and ambulating normally Well nourished, well hydrated White female, no apparent distress  She was unable to tolerate even having the speculum opened.     Assessment & Plan:  PMB- schedule d&c this Friday Pretreat with cytotec

## 2017-08-14 ENCOUNTER — Encounter (HOSPITAL_BASED_OUTPATIENT_CLINIC_OR_DEPARTMENT_OTHER)
Admission: RE | Disposition: A | Discharge status: Home / Self Care | Payer: Self-pay | Source: Ambulatory Visit | Attending: Obstetrics & Gynecology

## 2017-08-14 ENCOUNTER — Ambulatory Visit (HOSPITAL_BASED_OUTPATIENT_CLINIC_OR_DEPARTMENT_OTHER): Payer: 59 | Admitting: Certified Registered"

## 2017-08-14 ENCOUNTER — Ambulatory Visit (HOSPITAL_BASED_OUTPATIENT_CLINIC_OR_DEPARTMENT_OTHER)
Admission: RE | Admit: 2017-08-14 | Discharge: 2017-08-14 | Disposition: A | Discharge status: Home / Self Care | Payer: 59 | Source: Ambulatory Visit | Attending: Obstetrics & Gynecology | Admitting: Obstetrics & Gynecology

## 2017-08-14 ENCOUNTER — Other Ambulatory Visit: Payer: Self-pay

## 2017-08-14 ENCOUNTER — Encounter (HOSPITAL_BASED_OUTPATIENT_CLINIC_OR_DEPARTMENT_OTHER): Payer: Self-pay | Admitting: *Deleted

## 2017-08-14 DIAGNOSIS — E669 Obesity, unspecified: Secondary | ICD-10-CM | POA: Diagnosis not present

## 2017-08-14 DIAGNOSIS — Z79899 Other long term (current) drug therapy: Secondary | ICD-10-CM | POA: Insufficient documentation

## 2017-08-14 DIAGNOSIS — K219 Gastro-esophageal reflux disease without esophagitis: Secondary | ICD-10-CM | POA: Diagnosis not present

## 2017-08-14 DIAGNOSIS — Z6838 Body mass index (BMI) 38.0-38.9, adult: Secondary | ICD-10-CM | POA: Diagnosis not present

## 2017-08-14 DIAGNOSIS — N840 Polyp of corpus uteri: Secondary | ICD-10-CM | POA: Diagnosis not present

## 2017-08-14 DIAGNOSIS — N950 Postmenopausal bleeding: Secondary | ICD-10-CM

## 2017-08-14 DIAGNOSIS — I10 Essential (primary) hypertension: Secondary | ICD-10-CM | POA: Diagnosis not present

## 2017-08-14 DIAGNOSIS — Z7982 Long term (current) use of aspirin: Secondary | ICD-10-CM | POA: Insufficient documentation

## 2017-08-14 HISTORY — PX: DILATION AND CURETTAGE OF UTERUS: SHX78

## 2017-08-14 HISTORY — DX: Gastro-esophageal reflux disease without esophagitis: K21.9

## 2017-08-14 HISTORY — DX: Prediabetes: R73.03

## 2017-08-14 LAB — POCT I-STAT, CHEM 8
BUN: 11 mg/dL (ref 6–20)
Calcium, Ion: 1.13 mmol/L — ABNORMAL LOW (ref 1.15–1.40)
Chloride: 101 mmol/L (ref 98–111)
Creatinine, Ser: 0.7 mg/dL (ref 0.44–1.00)
Glucose, Bld: 98 mg/dL (ref 70–99)
HCT: 38 % (ref 36.0–46.0)
Hemoglobin: 12.9 g/dL (ref 12.0–15.0)
Potassium: 2.9 mmol/L — ABNORMAL LOW (ref 3.5–5.1)
Sodium: 141 mmol/L (ref 135–145)
TCO2: 26 mmol/L (ref 22–32)

## 2017-08-14 SURGERY — DILATION AND CURETTAGE
Anesthesia: General | Site: Vagina

## 2017-08-14 MED ORDER — BUPIVACAINE HCL (PF) 0.25 % IJ SOLN
INTRAMUSCULAR | Status: AC
  Filled 2017-08-14: qty 30

## 2017-08-14 MED ORDER — ONDANSETRON HCL 4 MG/2ML IJ SOLN
INTRAMUSCULAR | Status: DC | PRN
  Administered 2017-08-14: 4 mg via INTRAVENOUS

## 2017-08-14 MED ORDER — PROPOFOL 10 MG/ML IV BOLUS
INTRAVENOUS | Status: DC | PRN
  Administered 2017-08-14: 150 mg via INTRAVENOUS

## 2017-08-14 MED ORDER — FENTANYL CITRATE (PF) 100 MCG/2ML IJ SOLN
INTRAMUSCULAR | Status: AC
  Filled 2017-08-14: qty 2

## 2017-08-14 MED ORDER — IBUPROFEN 600 MG PO TABS: 600.0000 mg | ORAL_TABLET | Freq: Four times a day (QID) | ORAL | 1 refills | Status: DC | PRN

## 2017-08-14 MED ORDER — PROMETHAZINE HCL 25 MG/ML IJ SOLN
6.2500 mg | INTRAMUSCULAR | Status: DC | PRN
Start: 2017-08-14 — End: 2017-08-14

## 2017-08-14 MED ORDER — FENTANYL CITRATE (PF) 100 MCG/2ML IJ SOLN: 25.0000 ug | INTRAMUSCULAR | Status: DC | PRN

## 2017-08-14 MED ORDER — SUCCINYLCHOLINE CHLORIDE 20 MG/ML IJ SOLN
INTRAMUSCULAR | Status: DC | PRN
  Administered 2017-08-14: 140 mg via INTRAVENOUS

## 2017-08-14 MED ORDER — BUPIVACAINE HCL (PF) 0.5 % IJ SOLN
INTRAMUSCULAR | Status: AC
Start: 2017-08-14 — End: ?
  Filled 2017-08-14: qty 30

## 2017-08-14 MED ORDER — LIDOCAINE-EPINEPHRINE (PF) 1 %-1:200000 IJ SOLN
INTRAMUSCULAR | Status: AC
  Filled 2017-08-14: qty 30

## 2017-08-14 MED ORDER — DEXAMETHASONE SODIUM PHOSPHATE 10 MG/ML IJ SOLN
INTRAMUSCULAR | Status: AC
  Filled 2017-08-14: qty 1

## 2017-08-14 MED ORDER — MIDAZOLAM HCL 2 MG/2ML IJ SOLN
1.0000 mg | INTRAMUSCULAR | Status: DC | PRN
  Administered 2017-08-14: 2 mg via INTRAVENOUS

## 2017-08-14 MED ORDER — OXYCODONE HCL 5 MG/5ML PO SOLN: 5.0000 mg | Freq: Once | ORAL | Status: DC | PRN

## 2017-08-14 MED ORDER — KETOROLAC TROMETHAMINE 30 MG/ML IJ SOLN
INTRAMUSCULAR | Status: DC | PRN
  Administered 2017-08-14: 30 mg via INTRAVENOUS

## 2017-08-14 MED ORDER — BUPIVACAINE HCL 0.5 % IJ SOLN
INTRAMUSCULAR | Status: DC | PRN
  Administered 2017-08-14: 20 mL

## 2017-08-14 MED ORDER — OXYCODONE HCL 5 MG PO TABS: 5.0000 mg | ORAL_TABLET | Freq: Once | ORAL | Status: DC | PRN

## 2017-08-14 MED ORDER — KETOROLAC TROMETHAMINE 30 MG/ML IJ SOLN
INTRAMUSCULAR | Status: AC
  Filled 2017-08-14: qty 1

## 2017-08-14 MED ORDER — ONDANSETRON HCL 4 MG/2ML IJ SOLN
INTRAMUSCULAR | Status: AC
  Filled 2017-08-14: qty 2

## 2017-08-14 MED ORDER — SCOPOLAMINE 1 MG/3DAYS TD PT72: 1.0000 | MEDICATED_PATCH | Freq: Once | TRANSDERMAL | Status: DC | PRN

## 2017-08-14 MED ORDER — LACTATED RINGERS IV SOLN
INTRAVENOUS | Status: DC
  Administered 2017-08-14: 11:00:00 via INTRAVENOUS

## 2017-08-14 MED ORDER — SODIUM BICARBONATE 4 % IV SOLN
INTRAVENOUS | Status: AC
  Filled 2017-08-14: qty 5

## 2017-08-14 MED ORDER — DEXAMETHASONE SODIUM PHOSPHATE 4 MG/ML IJ SOLN
INTRAMUSCULAR | Status: DC | PRN
  Administered 2017-08-14: 10 mg via INTRAVENOUS

## 2017-08-14 MED ORDER — POTASSIUM CHLORIDE CRYS ER 20 MEQ PO TBCR
40.0000 meq | EXTENDED_RELEASE_TABLET | Freq: Two times a day (BID) | ORAL | Status: DC
  Administered 2017-08-14: 40 meq via ORAL
  Filled 2017-08-14: qty 2

## 2017-08-14 MED ORDER — FENTANYL CITRATE (PF) 100 MCG/2ML IJ SOLN
50.0000 ug | INTRAMUSCULAR | Status: DC | PRN
  Administered 2017-08-14: 100 ug via INTRAVENOUS

## 2017-08-14 MED ORDER — LIDOCAINE HCL (CARDIAC) PF 100 MG/5ML IV SOSY
PREFILLED_SYRINGE | INTRAVENOUS | Status: DC | PRN
  Administered 2017-08-14: 100 mg via INTRAVENOUS

## 2017-08-14 MED ORDER — MIDAZOLAM HCL 2 MG/2ML IJ SOLN
INTRAMUSCULAR | Status: AC
  Filled 2017-08-14: qty 2

## 2017-08-14 MED ORDER — OXYCODONE-ACETAMINOPHEN 5-325 MG PO TABS: 1.0000 | ORAL_TABLET | ORAL | 0 refills | Status: DC | PRN

## 2017-08-14 SURGICAL SUPPLY — 14 items
BRIEF STRETCH FOR OB PAD XXL (UNDERPADS AND DIAPERS) ×2 IMPLANT
DILATOR CANAL MILEX (MISCELLANEOUS) IMPLANT
GLOVE BIO SURGEON STRL SZ 6.5 (GLOVE) ×2 IMPLANT
GLOVE BIOGEL PI IND STRL 7.0 (GLOVE) ×1 IMPLANT
GLOVE BIOGEL PI INDICATOR 7.0 (GLOVE) ×1
GOWN STRL REUS W/ TWL XL LVL3 (GOWN DISPOSABLE) ×1 IMPLANT
GOWN STRL REUS W/TWL LRG LVL3 (GOWN DISPOSABLE) ×2 IMPLANT
GOWN STRL REUS W/TWL XL LVL3 (GOWN DISPOSABLE) ×1
NEEDLE SPNL 18GX3.5 QUINCKE PK (NEEDLE) ×2 IMPLANT
PACK VAGINAL MINOR WOMEN LF (CUSTOM PROCEDURE TRAY) ×2 IMPLANT
PAD OB MATERNITY 4.3X12.25 (PERSONAL CARE ITEMS) ×2 IMPLANT
PAD PREP 24X48 CUFFED NSTRL (MISCELLANEOUS) ×2 IMPLANT
SLEEVE SCD COMPRESS KNEE MED (MISCELLANEOUS) ×2 IMPLANT
TOWEL GREEN STERILE FF (TOWEL DISPOSABLE) ×4 IMPLANT

## 2017-08-14 NOTE — Anesthesia Postprocedure Evaluation (Signed)
Anesthesia Post Note  Patient: Megan [name]  Procedure(s) Performed: DILATATION AND CURETTAGE (N/A Vagina )     Patient location during evaluation: PACU Anesthesia Type: General Level of consciousness: awake and alert Pain management: pain level controlled Vital Signs Assessment: post-procedure vital signs reviewed and stable Respiratory status: spontaneous breathing, nonlabored ventilation, respiratory function stable and patient connected to nasal cannula oxygen Cardiovascular status: blood pressure returned to baseline and stable Postop Assessment: no apparent nausea or vomiting Anesthetic complications: no    Last Vitals:  Vitals:   08/14/17 1300 08/14/17 1315  BP: 125/88 129/88  Pulse: 84 78  Resp: 16 12  Temp:    SpO2: 100% 100%    Last Pain:  Vitals:   08/14/17 1315  TempSrc:   PainSc: 0-No pain                 Miken Stecher S

## 2017-08-14 NOTE — Op Note (Signed)
08/14/2017  12:37 PM  PATIENT:  Megan [name]  57 y.o. female  PRE-OPERATIVE DIAGNOSIS:  PMB  POST-OPERATIVE DIAGNOSIS:  PMB  PROCEDURE:  Procedure(s): DILATATION AND CURETTAGE (N/A)  SURGEON:  Surgeon(s) and Role:    * Ariyannah Pauling, Wilhemina Cash, MD - Primary  ANESTHESIA:   local and general  EBL: minimal  BLOOD ADMINISTERED:none  DRAINS: none   LOCAL MEDICATIONS USED:  MARCAINE     SPECIMEN:  Source of Specimen:  uterine curettings  DISPOSITION OF SPECIMEN:  PATHOLOGY  COUNTS:  YES  TOURNIQUET:  * No tourniquets in log *  DICTATION: .Dragon Dictation  PLAN OF CARE: Discharge to home after PACU  PATIENT DISPOSITION:  PACU - hemodynamically stable.   Delay start of Pharmacological VTE agent (>24hrs) due to surgical blood loss or risk of bleeding: not applicable    The risks, benefits, and alternatives of surgery were explained, understood, and accepted. All questions were answered. Consents were signed. In the operating room general anesthesia was applied without complication, and she was placed in the dorsal lithotomy position. Her vagina was prepped and draped in the usual sterile fashion. Because her vagina would not tolerate a Graves speculum, I used a narrow Deaver posteriorly and anteriorly to view the cervix.  Asingle-tooth tenaculum was used to grasp the anterior lip of her cervix. A total of 20 mL of 0.5% Marcaine was used to perform a paracervical block. Her uterus sounded to 8 cm. Her cervix was carefully and slowly dilated to accommodate a small curette. A curettage was done in all quadrants and the fundus of the uterus. A small amount of polypoid-type  tissue was obtained. A gritty sensation was appreciated throughout. There was no bleeding noted at the end of the case. She was taken to the recovery room after being extubated. She tolerated the procedure well.

## 2017-08-14 NOTE — Anesthesia Preprocedure Evaluation (Signed)
Anesthesia Evaluation  Patient identified by MRN, date of birth, ID band Patient awake    Reviewed: Allergy & Precautions, NPO status , [name] Chart, lab work & pertinent test results  Airway Mallampati: II  TM Distance: >3 FB Neck ROM: Full    Dental no notable dental hx.    Pulmonary neg pulmonary ROS,    Pulmonary exam normal breath sounds clear to auscultation       Cardiovascular hypertension, Normal cardiovascular exam Rhythm:Regular Rate:Normal     Neuro/Psych negative neurological ROS  negative psych ROS   GI/Hepatic Neg liver ROS, GERD  Medicated,  Endo/Other  negative endocrine ROSMorbid obesity  Renal/GU negative Renal ROS  negative genitourinary   Musculoskeletal negative musculoskeletal ROS (+)   Abdominal   Peds negative pediatric ROS (+)  Hematology negative hematology ROS (+)   Anesthesia Other Findings   Reproductive/Obstetrics negative OB ROS                             Anesthesia Physical Anesthesia Plan  ASA: III  Anesthesia Plan: General   Post-op Pain Management:    Induction: Intravenous  PONV Risk Score and Plan: 3 and Ondansetron, Dexamethasone, Treatment may vary due to age or medical condition and Midazolam  Airway Management Planned: LMA  Additional Equipment:   Intra-op Plan:   Post-operative Plan:   Informed Consent: I have reviewed the patients History and Physical, chart, labs and discussed the procedure including the risks, benefits and alternatives for the proposed anesthesia with the patient or authorized representative who has indicated his/her understanding and acceptance.   Dental advisory given  Plan Discussed with: CRNA and Surgeon  Anesthesia Plan Comments:         Anesthesia Quick Evaluation

## 2017-08-14 NOTE — H&P (Signed)
Megan [name] is an 57 y.o. female. Married P0 here for a d&c. I attempted a EMBX last week but she was unable to tolerate it. PMB started after she started Activella about 5/19.     No LMP recorded. Patient is postmenopausal.    Past Medical History:  Diagnosis Date  . Acute respiratory failure ([place])   . GERD (gastroesophageal reflux disease)   . Hypertension   . Menopausal vasomotor syndrome   . Obesity   . Pre-diabetes   . RSV (respiratory syncytial virus pneumonia)     Past Surgical History:  Procedure Laterality Date  . ANKLE ARTHROSCOPY Left 1985    Family History  Problem Relation Age of Onset  . Heart disease Mother   . Hypertension Mother   . Heart disease Father   . Breast cancer Maternal Grandmother     Social History:  reports that she has never smoked. She has never used smokeless tobacco. She reports that she does not drink alcohol or use drugs.  Allergies: No Known Allergies  Medications Prior to Admission  Medication Sig Dispense Refill Last Dose  . aspirin EC 81 MG tablet Take 1 tablet (81 mg total) by mouth daily. 90 tablet 3 08/12/2017 at Unknown time  . Dexlansoprazole 30 MG capsule Take 1 capsule (30 mg total) by mouth daily. 30 capsule 5 08/14/2017 at 0730  . hydrochlorothiazide (HYDRODIURIL) 25 MG tablet Take 1 tablet (25 mg total) by mouth daily. 90 tablet 1 08/13/2017 at 0730  . sertraline (ZOLOFT) 50 MG tablet Take 1 tablet (50 mg total) by mouth at bedtime. 90 tablet 1 08/13/2017 at 2130    ROS  Blood pressure 106/84, pulse 79, temperature 98.3 F (36.8 C), temperature source Oral, resp. rate 16, height 5' 4"  (1.626 m), weight 101.2 kg (223 lb), SpO2 97 %. Physical Exam  Heart- rrr Lungs-CTAB Abd- benign  Results for orders placed or performed during the hospital encounter of 08/14/17 (from the past 24 hour(s))  I-STAT, chem 8     Status: Abnormal   Collection Time: 08/14/17 11:01 AM  Result Value Ref Range   Sodium 141 135 - 145 mmol/L    Potassium 2.9 (L) 3.5 - 5.1 mmol/L   Chloride 101 98 - 111 mmol/L   BUN 11 6 - 20 mg/dL   Creatinine, Ser 0.70 0.44 - 1.00 mg/dL   Glucose, Bld 98 70 - 99 mg/dL   Calcium, Ion 1.13 (L) 1.15 - 1.40 mmol/L   TCO2 26 22 - 32 mmol/L   Hemoglobin 12.9 12.0 - 15.0 g/dL   HCT 38.0 36.0 - 46.0 %    No results found.  Assessment/Plan: PMB- for d&c  She understands the risks of surgery, including, but not to infection, bleeding, DVTs, damage to bowel, bladder, ureters. She wishes to proceed.     Emily Filbert 08/14/2017, 12:05 PM

## 2017-08-14 NOTE — Anesthesia Procedure Notes (Signed)
Procedure Name: Intubation Performed by: Verita Lamb, CRNA Pre-anesthesia Checklist: Patient identified, Emergency Drugs available, Suction available, Patient being monitored and Timeout performed Patient Re-evaluated:Patient Re-evaluated prior to induction Oxygen Delivery Method: Circle system utilized Preoxygenation: Pre-oxygenation with 100% oxygen Induction Type: IV induction Ventilation: Mask ventilation without difficulty and Oral airway inserted - appropriate to patient size Laryngoscope Size: Mac and 3 Grade View: Grade II Tube type: Oral Tube size: 7.0 mm Number of attempts: 1 Airway Equipment and Method: Stylet Placement Confirmation: ETT inserted through vocal cords under direct vision,  positive ETCO2,  CO2 detector and breath sounds checked- equal and bilateral Secured at: 20 cm Tube secured with: Tape Dental Injury: Teeth and Oropharynx as per pre-operative assessment

## 2017-08-14 NOTE — Transfer of Care (Signed)
Immediate Anesthesia Transfer of Care Note  Patient: Megan [name]  Procedure(s) Performed: DILATATION AND CURETTAGE (N/A Vagina )  Patient Location: PACU  Anesthesia Type:General  Level of Consciousness: awake, alert  and oriented  Airway & Oxygen Therapy: Patient Spontanous Breathing and Patient connected to face mask oxygen  Post-op Assessment: Report given to RN and Post -op Vital signs reviewed and stable  Post vital signs: Reviewed and stable  Last Vitals:  Vitals Value Taken Time  BP 132/83 08/14/2017 12:55 PM  Temp    Pulse 84 08/14/2017  1:00 PM  Resp 16 08/14/2017  1:00 PM  SpO2 100 % 08/14/2017  1:00 PM  Vitals shown include unvalidated device data.  Last Pain:  Vitals:   08/14/17 1037  TempSrc: Oral         Complications: No apparent anesthesia complications

## 2017-08-14 NOTE — Discharge Instructions (Signed)
Dilation and Curettage or Vacuum Curettage, Care After This sheet gives you information about how to care for yourself after your procedure. Your health care provider may also give you more specific instructions. If you have problems or questions, contact your health care provider. What can I expect after the procedure? After your procedure, it is common to have:  Mild pain or cramping.  Some vaginal bleeding or spotting.  These may last for up to 2 weeks after your procedure. Follow these instructions at home: Activity   Do not drive or use heavy machinery while taking prescription pain medicine.  Avoid driving for the first 24 hours after your procedure.  Take frequent, short walks, followed by rest periods, throughout the day. Ask your health care provider what activities are safe for you. After 1-2 days, you may be able to return to your normal activities.  Do not lift anything heavier than 10 lb (4.5 kg) until your health care provider approves.  For at least 2 weeks, or as long as told by your health care provider, do not: ? Douche. ? Use tampons. ? Have sexual intercourse.  General instructions   Take over-the-counter and prescription medicines only as told by your health care provider. This is especially important if you take blood thinning medicine.  Do not take baths, swim, or use a hot tub until your health care provider approves. Take showers instead of baths.  Wear compression stockings as told by your health care provider. These stockings help to prevent blood clots and reduce swelling in your legs.  It is your responsibility to get the results of your procedure. Ask your health care provider, or the department performing the procedure, when your results will be ready.  Keep all follow-up visits as told by your health care provider. This is important. Contact a health care provider if:  You have severe cramps that get worse or that do not get better with  medicine.  You have severe abdominal pain.  You cannot drink fluids without vomiting.  You develop pain in a different area of your pelvis.  You have bad-smelling vaginal discharge.  You have a rash. Get help right away if:  You have vaginal bleeding that soaks more than one sanitary pad in 1 hour, for 2 hours in a row.  You pass large blood clots from your vagina.  You have a fever that is above 100.62F (38.0C).  Your abdomen feels very tender or hard.  You have chest pain.  You have shortness of breath.  You cough up blood.  You feel dizzy or light-headed.  You faint.  You have pain in your neck or shoulder area. This information is not intended to replace advice given to you by your health care provider. Make sure you discuss any questions you have with your health care provider. Document Released: 02/01/2000 Document Revised: 10/03/2015 Document Reviewed: 09/06/2015 Elsevier Interactive Patient Education  2018 [employer].   No ibuprofen until 7:00pm!   Post Anesthesia Home Care Instructions  Activity: Get plenty of rest for the remainder of the day. A responsible individual must stay with you for 24 hours following the procedure.  For the next 24 hours, DO NOT: -Drive a car -Paediatric nurse -Drink alcoholic beverages -Take any medication unless instructed by your physician -Make any legal decisions or sign important papers.  Meals: Start with liquid foods such as gelatin or soup. Progress to regular foods as tolerated. Avoid greasy, spicy, heavy foods. If nausea and/or vomiting occur,  drink only clear liquids until the nausea and/or vomiting subsides. Call your physician if vomiting continues.  Special Instructions/Symptoms: Your throat may feel dry or sore from the anesthesia or the breathing tube placed in your throat during surgery. If this causes discomfort, gargle with warm salt water. The discomfort should disappear within 24 hours.  If you had  a scopolamine patch placed behind your ear for the management of post- operative nausea and/or vomiting:  1. The medication in the patch is effective for 72 hours, after which it should be removed.  Wrap patch in a tissue and discard in the trash. Wash hands thoroughly with soap and water. 2. You may remove the patch earlier than 72 hours if you experience unpleasant side effects which may include dry mouth, dizziness or visual disturbances. 3. Avoid touching the patch. Wash your hands with soap and water after contact with the patch.

## 2017-08-17 ENCOUNTER — Encounter (HOSPITAL_BASED_OUTPATIENT_CLINIC_OR_DEPARTMENT_OTHER): Payer: Self-pay | Admitting: Obstetrics & Gynecology

## 2017-08-18 ENCOUNTER — Telehealth: Payer: Self-pay | Admitting: *Deleted

## 2017-08-18 NOTE — Telephone Encounter (Signed)
Good Morning Patient called and would like a return call in reference to possible side effects of anesthesia. Patient states that her feet and hands have been itching since surgery and she can't get them to stop no matter what she has tried.

## 2017-08-19 ENCOUNTER — Encounter (INDEPENDENT_AMBULATORY_CARE_PROVIDER_SITE_OTHER): Payer: Self-pay

## 2017-09-07 ENCOUNTER — Encounter: Payer: Self-pay | Admitting: Physician Assistant

## 2017-09-07 ENCOUNTER — Ambulatory Visit (INDEPENDENT_AMBULATORY_CARE_PROVIDER_SITE_OTHER): Payer: 59 | Admitting: Physician Assistant

## 2017-09-07 ENCOUNTER — Ambulatory Visit: Payer: Managed Care, Other (non HMO)

## 2017-09-07 ENCOUNTER — Ambulatory Visit (INDEPENDENT_AMBULATORY_CARE_PROVIDER_SITE_OTHER): Payer: Managed Care, Other (non HMO)

## 2017-09-07 VITALS — BP 128/87 | HR 76 | Wt 224.0 lb

## 2017-09-07 DIAGNOSIS — R079 Chest pain, unspecified: Secondary | ICD-10-CM

## 2017-09-07 DIAGNOSIS — I10 Essential (primary) hypertension: Secondary | ICD-10-CM

## 2017-09-07 DIAGNOSIS — L299 Pruritus, unspecified: Secondary | ICD-10-CM | POA: Insufficient documentation

## 2017-09-07 DIAGNOSIS — E876 Hypokalemia: Secondary | ICD-10-CM

## 2017-09-07 DIAGNOSIS — R0781 Pleurodynia: Secondary | ICD-10-CM | POA: Diagnosis not present

## 2017-09-07 DIAGNOSIS — R05 Cough: Secondary | ICD-10-CM

## 2017-09-07 DIAGNOSIS — J9811 Atelectasis: Secondary | ICD-10-CM

## 2017-09-07 DIAGNOSIS — R2242 Localized swelling, mass and lump, left lower limb: Secondary | ICD-10-CM

## 2017-09-07 MED ORDER — AMLODIPINE BESYLATE 5 MG PO TABS: 5.0000 mg | ORAL_TABLET | Freq: Every day | ORAL | 5 refills | Status: DC

## 2017-09-07 NOTE — Patient Instructions (Addendum)
For your blood pressure: - Goal <130/80 - stop Hydrochlorothiazide - start Amlodipine 5 mg every morning - baby aspirin 81 mg daily to help prevent heart attack/stroke - monitor and log blood pressures at home - check around the same time each day in a relaxed setting - Limit salt to <2000 mg/day - Follow DASH eating plan - limit alcohol to 2 standard drinks per day for men and 1 per day for women - avoid tobacco products - weight loss: 7% of current body weight - return in 2 weeks for nurse BP check. Bring home BP readings   Pruritus Pruritus is an itching feeling. There are many different conditions and factors that can make your skin itchy. Dry skin is one of the most common causes of itching. Most cases of itching do not require medical attention. Itchy skin can turn into a rash. Follow these instructions at home: Watch your pruritus for any changes. Take these steps to help with your condition: Skin Care  Moisturize your skin as needed. A moisturizer that contains petroleum jelly is best for keeping moisture in your skin.  Take or apply medicines only as directed by your health care provider. This may include: ? Corticosteroid cream. ? Anti-itch lotions. ? Oral anti-histamines.  Apply cool compresses to the affected areas.  Try taking a bath with: ? Epsom salts. Follow the instructions on the packaging. You can get these at your local pharmacy or grocery store. ? Baking soda. Pour a small amount into the bath as directed by your health care provider. ? Colloidal oatmeal. Follow the instructions on the packaging. You can get this at your local pharmacy or grocery store.  Try applying baking soda paste to your skin. Stir water into baking soda until it reaches a paste-like consistency.  Do not scratch your skin.  Avoid hot showers or baths, which can make itching worse. A cold shower may help with itching as long as you use a moisturizer after.  Avoid scented soaps,  detergents, and perfumes. Use gentle soaps, detergents, perfumes, and other cosmetic products. General instructions  Avoid wearing tight clothes.  Keep a journal to help track what causes your itch. Write down: ? What you eat. ? What cosmetic products you use. ? What you drink. ? What you wear. This includes jewelry.  Use a humidifier. This keeps the air moist, which helps to prevent dry skin. Contact a health care provider if:  The itching does not go away after several days.  You sweat at night.  You have weight loss.  You are unusually thirsty.  You urinate more than normal.  You are more tired than normal.  You have abdominal pain.  Your skin tingles.  You feel weak.  Your skin or the whites of your eyes look yellow (jaundice).  Your skin feels numb. This information is not intended to replace advice given to you by your health care provider. Make sure you discuss any questions you have with your health care provider. Document Released: 10/16/2010 Document Revised: 07/12/2015 Document Reviewed: 01/30/2014 Elsevier Interactive Patient Education  [employer].

## 2017-09-07 NOTE — Progress Notes (Signed)
Subjective:    Patient ID: Megan [name], female    DOB: [birth year], 57 y.o.   MRN: [id number]  HPI  Pt is a 57 yr old female presenting to the clinic today complaining hands and feet itching with no rashes. This has been going for 3 weeks and only happens at night. This has never happened before. Tried benadryl which has helped her sleep at night. Denies new detergent, medicine, food since this has happened. Pt did mentioned getting a D&C 3 weeks ago with no complications.   Pt also mentioned 2 weeks ago she had a lawn mower accident: She says the lawn mower fell on top of her. When she fell she hit her head but denies LOC, HA, blurry, vision, dizziness or weakness. Since then she has had a lump on her left popliteal fossa. She says it is not tender to palpation just wanted to make sure it is normal. Denies skin changes to the area, claudication or leg swelling. Since her fall pt also noticed chest soreness when lying on the right side. She says when she takes a deep breath she feels someone is stabbing her in the back. She says it was sore to touch but not anymore. She has no tried anything for this. The chest soreness is non exertional and only comes up when she lays on her right side and when she takes deep breaths. Denies fever, chills, N/V, diaphoresis or any other symptoms.    Review of Systems  Constitutional: Negative for chills, diaphoresis, fatigue and fever.  HENT: Negative.   Eyes: Negative.   Respiratory: Negative for cough.   Cardiovascular: Positive for chest pain (Chest soreness). Negative for palpitations and leg swelling.  Gastrointestinal: Negative.   Skin: Negative for color change and wound.       + pruritus bilateral hands/feet Firm mass on left popliteal fossa       Objective:   Physical Exam  Constitutional: She is oriented to person, place, and time. She appears well-developed and well-nourished. No distress.  HENT:  Head: Normocephalic and atraumatic.  Eyes:  Pupils are equal, round, and reactive to light. Conjunctivae and EOM are normal.  Neck: Normal range of motion.  Cardiovascular: Normal rate, regular rhythm and normal heart sounds. Exam reveals no friction rub.  No murmur heard. Pulmonary/Chest: Effort normal and breath sounds normal. She has no wheezes. She has no rales. She exhibits no tenderness.  Musculoskeletal: Normal range of motion.  Neurological: She is alert and oriented to person, place, and time.  Skin: Skin is warm and dry. No rash noted.     Approx 2.5 cm x 1 cm firm, indurated, nontender area involving medial left popliteal fossa and proximal calf    Vitals:   09/07/17 0953  BP: 128/87  Pulse: 76      Assessment & Plan:  .Marland KitchenElmer was seen today for pruritis.  Diagnoses and all orders for this visit:  Pruritus -     CBC with Differential/Platelet -     Comprehensive metabolic panel -     TSH + free T4 -     Hemoglobin A1c  Pleuritic chest pain -     DG Chest 2 View  Hypokalemia -     Comprehensive metabolic panel -     Magnesium  Localized swelling, mass, or lump of lower extremity, left -     US Venous Img Lower Unilateral Left  Essential hypertension -     amLODipine (NORVASC) 5 MG tablet; Take  1 tablet (5 mg total) by mouth daily.  -Will look at TSH, Liver/Kidney and A1c to evaluate for pruritus w/ no rash. In reviewing patient's post-op labs, noted hypokalemia at 2.9 on 08/14/17. Patient was instructed to stop her thiazide and was switched to Amlodipine. Will follow up with pt with results. In the meantime, instructed to keep skin moisturized using unscented cream. May continue Benadryl prn QHS for itching.  -Chest wall trauma. Will order CXR to evaluate for possible rib fracture. No contusions on exam. Lung sounds are normal. If neg pt told most like bruising/costochondritis from the fall and that this is self-limited. Treat symptomatically with anti-inflammatories, ice/heat.  -Mass: non-tender, firm  mass without skin changes following trauma 2 weeks ago. Will get ultrasound to further evaluate, r/o thrombophlebitis/DVT. If normal pt told to massage apply heat to the area.

## 2017-09-08 ENCOUNTER — Encounter: Payer: Self-pay | Admitting: Physician Assistant

## 2017-09-08 DIAGNOSIS — J9811 Atelectasis: Secondary | ICD-10-CM | POA: Insufficient documentation

## 2017-09-08 LAB — MAGNESIUM: Magnesium: 2.2 mg/dL (ref 1.5–2.5)

## 2017-09-08 LAB — COMPREHENSIVE METABOLIC PANEL
AG Ratio: 1.6 (calc) (ref 1.0–2.5)
ALT: 19 U/L (ref 6–29)
AST: 22 U/L (ref 10–35)
Albumin: 4.3 g/dL (ref 3.6–5.1)
Alkaline phosphatase (APISO): 84 U/L (ref 33–130)
BUN: 9 mg/dL (ref 7–25)
CO2: 34 mmol/L — ABNORMAL HIGH (ref 20–32)
Calcium: 9.9 mg/dL (ref 8.6–10.4)
Chloride: 99 mmol/L (ref 98–110)
Creat: 0.86 mg/dL (ref 0.50–1.05)
Globulin: 2.7 g/dL (calc) (ref 1.9–3.7)
Glucose, Bld: 102 mg/dL (ref 65–139)
Potassium: 4.3 mmol/L (ref 3.5–5.3)
SODIUM: 140 mmol/L (ref 135–146)
Total Bilirubin: 0.7 mg/dL (ref 0.2–1.2)
Total Protein: 7 g/dL (ref 6.1–8.1)

## 2017-09-08 LAB — CBC WITH DIFFERENTIAL/PLATELET
BASOS ABS: 42 {cells}/uL (ref 0–200)
Basophils Relative: 0.8 %
Eosinophils Absolute: 101 cells/uL (ref 15–500)
Eosinophils Relative: 1.9 %
HCT: 36.7 % (ref 35.0–45.0)
Hemoglobin: 11.9 g/dL (ref 11.7–15.5)
Lymphs Abs: 1288 cells/uL (ref 850–3900)
MCH: 25.9 pg — ABNORMAL LOW (ref 27.0–33.0)
MCHC: 32.4 g/dL (ref 32.0–36.0)
MCV: 80 fL (ref 80.0–100.0)
MPV: 12.1 fL (ref 7.5–12.5)
Monocytes Relative: 8.1 %
Neutro Abs: 3440 cells/uL (ref 1500–7800)
Neutrophils Relative %: 64.9 %
Platelets: 218 10*3/uL (ref 140–400)
RBC: 4.59 10*6/uL (ref 3.80–5.10)
RDW: 14.5 % (ref 11.0–15.0)
Total Lymphocyte: 24.3 %
WBC mixed population: 429 cells/uL (ref 200–950)
WBC: 5.3 10*3/uL (ref 3.8–10.8)

## 2017-09-08 LAB — TSH+FREE T4: TSH W/REFLEX TO FT4: 1.46 mIU/L (ref 0.40–4.50)

## 2017-09-08 LAB — HEMOGLOBIN A1C
Hgb A1c MFr Bld: 6.1 % of total Hgb — ABNORMAL HIGH (ref <5.7)
Mean Plasma Glucose: 128 (calc)
eAG (mmol/L): 7.1 (calc)

## 2017-09-08 MED ORDER — AZITHROMYCIN 250 MG PO TABS: ORAL_TABLET | ORAL | 0 refills | Status: DC

## 2017-09-08 NOTE — Addendum Note (Signed)
Addended by: Nelson Chimes E on: 09/08/2017 12:32 PM   Modules accepted: Orders

## 2017-09-08 NOTE — Addendum Note (Signed)
Addended by: Nelson Chimes E on: 09/08/2017 12:39 PM   Modules accepted: Orders

## 2017-09-08 NOTE — Progress Notes (Signed)
Hi Judy,  Your chest x-ray showed an abnormality in your left lower lung. It is likely that due to your pain from the fall, you have been taking shallow breaths for the last 2 weeks. This can cause the small airways in your lung to collapse (this is called atelectasis). It is hard to distinguish this from pneumonia in a chest x-ray. So I do recommend we also cover you for possible pneumonia with antibiotics. I would also like you to pick up an incentive spirometer from the front desk. This will help open up your airways by encouraging full, deep breaths.  Your other labs look good. There are no abnormalities to explain the itching. It's possible this is an adverse effect of one of your medications. Let's see if this improves with stopping the Hydrochlorothiazide.  Your low potassium level also resolved on its own.   Lastly, your leg ultrasound was normal. There is no clot. The area should improve on its own with time. Massage and heat may also help.  Please follow-up in 4 weeks for a repeat chest x-ray. Please follow-up sooner if you are having fever, shortness of breath or cough.  Best, Evlyn Clines

## 2017-09-09 ENCOUNTER — Ambulatory Visit: Payer: Managed Care, Other (non HMO) | Admitting: Physician Assistant

## 2017-09-10 ENCOUNTER — Ambulatory Visit: Payer: Managed Care, Other (non HMO) | Admitting: Physician Assistant

## 2017-09-17 ENCOUNTER — Encounter: Payer: Self-pay | Admitting: Physician Assistant

## 2017-09-21 ENCOUNTER — Ambulatory Visit (INDEPENDENT_AMBULATORY_CARE_PROVIDER_SITE_OTHER): Payer: 59 | Admitting: Physician Assistant

## 2017-09-21 DIAGNOSIS — I10 Essential (primary) hypertension: Secondary | ICD-10-CM

## 2017-09-21 MED ORDER — HYDROCHLOROTHIAZIDE 25 MG PO TABS
25.0000 mg | ORAL_TABLET | Freq: Every day | ORAL | 5 refills | Status: DC
Start: 2017-09-21 — End: 2017-10-07

## 2017-09-21 NOTE — Progress Notes (Signed)
HCTZ was held due to hypokalemia on hospital labs in May. She was switched to Amlodipine as a precaution. Recheck K was within normal limits. She is having uncomfortable peripheral edema with the Amlodipine, so we will switch her back to HCTZ 25 mg QAM. Follow-up in 2 weeks

## 2017-09-21 NOTE — Progress Notes (Signed)
   Subjective:    Patient ID: Megan [name], female    DOB: May 26, 1960, 57 y.o.   MRN: [id number]  HPI Patient is here for blood pressure check. Denies trouble sleeping, palpitations, or medication problems.     Review of Systems     Objective:   Physical Exam        Assessment & Plan:  Pt's BP was within goal of <130/80 in the office. Pt reports that at home, Bps have been running about 138 to 145 over 80 to 88. Pt reports being compliant with med changed but states she has been taking Amlodipine at night due to some painful swelling in left leg. Pt denied cutting back on salt or making dietary changes.   I had advised PCP of pt update and she advised that pt stop amlodipine and restart HCTZ. I have advised pt and per provider, will send in refill. Pt advised to work on dietary changes, continue to check BPS at home, and keep upcoming appt on 10-07-17

## 2017-09-30 ENCOUNTER — Ambulatory Visit: Payer: 59 | Admitting: Obstetrics & Gynecology

## 2017-09-30 ENCOUNTER — Encounter: Payer: Self-pay | Admitting: Obstetrics & Gynecology

## 2017-09-30 VITALS — BP 133/80 | HR 108 | Ht 64.0 in | Wt 224.0 lb

## 2017-09-30 DIAGNOSIS — Z9889 Other specified postprocedural states: Secondary | ICD-10-CM

## 2017-09-30 NOTE — Progress Notes (Signed)
   Subjective:    Patient ID: Megan [name], female    DOB: 04/29/1960, 57 y.o.   MRN: [id number]  HPI 57 yo married P0 here for a post op visit. She had a d&c on 08-14-17 for PMB with an endometrial thickness of 7 mm seen on u/s.  She has had no post op problems except for some itching which resolved by itself. She had some light bleeding for about 4-5 days post op   Review of Systems Her sister died from ovarian cancer. FH- no breast/colon cancer + stomach cancer in maternal GF    Objective:   Physical Exam Breathing, conversing, and ambulating normally Pathology is as follows:  Endometrium, curettage - BENIGN ENDOMETRIAL POLYP WITH HORMONE EFFECT. - NON-POLYPOID BENIGN INACTIVE ENDOMETRIUM. - NEGATIVE FOR HYPERPLASIA OR MALIGNANCY. Jaquita Folds MD Pathologist, Electronic Signature (Case signed 08/17/2017)     Assessment & Plan:  PMB with negative work up Come back for annual in a year/prn sooner Rec [facility]

## 2017-10-06 ENCOUNTER — Other Ambulatory Visit: Payer: Self-pay | Admitting: Physician Assistant

## 2017-10-06 DIAGNOSIS — F411 Generalized anxiety disorder: Secondary | ICD-10-CM

## 2017-10-06 DIAGNOSIS — F322 Major depressive disorder, single episode, severe without psychotic features: Secondary | ICD-10-CM

## 2017-10-07 ENCOUNTER — Ambulatory Visit (INDEPENDENT_AMBULATORY_CARE_PROVIDER_SITE_OTHER): Payer: 59 | Admitting: Physician Assistant

## 2017-10-07 ENCOUNTER — Encounter: Payer: Self-pay | Admitting: Physician Assistant

## 2017-10-07 VITALS — BP 132/86 | HR 73 | Wt 229.0 lb

## 2017-10-07 DIAGNOSIS — R7303 Prediabetes: Secondary | ICD-10-CM

## 2017-10-07 DIAGNOSIS — Z780 Asymptomatic menopausal state: Secondary | ICD-10-CM

## 2017-10-07 DIAGNOSIS — K219 Gastro-esophageal reflux disease without esophagitis: Secondary | ICD-10-CM | POA: Diagnosis not present

## 2017-10-07 DIAGNOSIS — Z1231 Encounter for screening mammogram for malignant neoplasm of breast: Secondary | ICD-10-CM | POA: Diagnosis not present

## 2017-10-07 DIAGNOSIS — Z79899 Other long term (current) drug therapy: Secondary | ICD-10-CM | POA: Insufficient documentation

## 2017-10-07 DIAGNOSIS — F325 Major depressive disorder, single episode, in full remission: Secondary | ICD-10-CM

## 2017-10-07 DIAGNOSIS — I10 Essential (primary) hypertension: Secondary | ICD-10-CM

## 2017-10-07 MED ORDER — HYDROCHLOROTHIAZIDE 25 MG PO TABS: 25.0000 mg | ORAL_TABLET | Freq: Every day | ORAL | 1 refills | Status: DC

## 2017-10-07 MED ORDER — SERTRALINE HCL 50 MG PO TABS: ORAL_TABLET | ORAL | 1 refills | Status: DC

## 2017-10-07 MED ORDER — DEXLANSOPRAZOLE 30 MG PO CPDR: 30.0000 mg | DELAYED_RELEASE_CAPSULE | Freq: Every day | ORAL | 1 refills | Status: DC

## 2017-10-07 NOTE — Patient Instructions (Addendum)
[phone] for Imaging Department  For your blood pressure: - Goal <130/80 - baby aspirin 81 mg daily to help prevent heart attack/stroke - monitor and log blood pressures at home - check around the same time each day in a relaxed setting - Limit salt to <2000 mg/day - Follow DASH eating plan - limit alcohol to 2 standard drinks per day for men and 1 per day for women - avoid tobacco products - weight loss: 7% of current body weight - follow-up every 6 months for your blood pressure    Prediabetes Eating Plan Prediabetes-also called impaired glucose tolerance or impaired fasting glucose-is a condition that causes blood sugar (blood glucose) levels to be higher than normal. Following a healthy diet can help to keep prediabetes under control. It can also help to lower the risk of type 2 diabetes and heart disease, which are increased in people who have prediabetes. Along with regular exercise, a healthy diet:  Promotes weight loss.  Helps to control blood sugar levels.  Helps to improve the way that the body uses insulin.  What do I need to know about this eating plan?  Use the glycemic index (GI) to plan your meals. The index tells you how quickly a food will raise your blood sugar. Choose low-GI foods. These foods take a longer time to raise blood sugar.  Pay close attention to the amount of carbohydrates in the food that you eat. Carbohydrates increase blood sugar levels.  Keep track of how many calories you take in. Eating the right amount of calories will help you to achieve a healthy weight. Losing about 7 percent of your starting weight can help to prevent type 2 diabetes.  You may want to follow a Mediterranean diet. This diet includes a lot of vegetables, lean meats or fish, whole grains, fruits, and healthy oils and fats. What foods can I eat? Grains Whole grains, such as whole-wheat or whole-grain breads, crackers, cereals, and pasta. Unsweetened oatmeal. Bulgur. Barley.  Quinoa. Brown rice. Corn or whole-wheat flour tortillas or taco shells. Vegetables Lettuce. Spinach. Peas. Beets. Cauliflower. Cabbage. Broccoli. Carrots. Tomatoes. Squash. Eggplant. Herbs. Peppers. Onions. Cucumbers. Brussels sprouts. Fruits Berries. Bananas. Apples. Oranges. Grapes. Papaya. Mango. Pomegranate. Kiwi. Grapefruit. Cherries. Meats and Other Protein Sources Seafood. Lean meats, such as chicken and [place] or lean cuts of pork and beef. Tofu. Eggs. Nuts. Beans. Dairy Low-fat or fat-free dairy products, such as yogurt, cottage cheese, and cheese. Beverages Water. Tea. Coffee. Sugar-free or diet soda. Seltzer water. Milk. Milk alternatives, such as soy or almond milk. Condiments Mustard. Relish. Low-fat, low-sugar ketchup. Low-fat, low-sugar barbecue sauce. Low-fat or fat-free mayonnaise. Sweets and Desserts Sugar-free or low-fat pudding. Sugar-free or low-fat ice cream and other frozen treats. Fats and Oils Avocado. Walnuts. Olive oil. The items listed above may not be a complete list of recommended foods or beverages. Contact your dietitian for more options. What foods are not recommended? Grains Refined white flour and flour products, such as bread, pasta, snack foods, and cereals. Beverages Sweetened drinks, such as sweet iced tea and soda. Sweets and Desserts Baked goods, such as cake, cupcakes, pastries, cookies, and cheesecake. The items listed above may not be a complete list of foods and beverages to avoid. Contact your dietitian for more information. This information is not intended to replace advice given to you by your health care provider. Make sure you discuss any questions you have with your health care provider. Document Released: 06/20/2014 Document Revised: 07/12/2015 Document Reviewed: 03/01/2014 Elsevier  Interactive Patient Education  2017 [employer].

## 2017-10-07 NOTE — Progress Notes (Signed)
HPI:                                                                Megan [name] is a 57 y.o. female who presents to [facility] today for med management  Depression/Anxiety: taking sertraline 50 mg without difficulty. Denies depressed mood or anhedonia. Reports she is feeling like herself again. Sleeping well. Denies symptoms of mania/hypomania. Denies suicidal thinking. Denies auditory/visual hallucinations.  Menopausal vasomotor symptoms: HRT discontinued due to AUB/fibroid. She underwent D&C. denies any hot flashes/diaphoresis/flushing.  GERD: was having breakthrough symptoms on Pantoprazole and Omeprazole. Switched to [employer] and is having no symptoms. Denies dysphagia, odynophagia, forceful vomiting, melena/hematochezia.  Prediabetes: diagnosed last month. Reports she has not made any dietary or lifestyle modifications  Depression screen [facility] 2/9 10/07/2017 07/06/2017 06/15/2017 06/04/2017  Decreased Interest 0 0 3 0  Down, Depressed, Hopeless 0 0 3 0  PHQ - 2 Score 0 0 6 0  Altered sleeping - 0 1 -  Tired, decreased energy - 0 3 -  Change in appetite - 0 3 -  Feeling bad or failure about yourself  - 0 3 -  Trouble concentrating - 0 3 -  Moving slowly or fidgety/restless - 0 1 -  Suicidal thoughts - 0 3 -  PHQ-9 Score - 0 23 -  Difficult doing work/chores - Not difficult at all Extremely dIfficult -    GAD 7 : Generalized Anxiety Score 10/07/2017 07/06/2017 06/15/2017  Nervous, Anxious, on Edge 0 0 3  Control/stop worrying 0 0 3  Worry too much - different things 0 0 3  Trouble relaxing 0 0 3  Restless 0 0 3  Easily annoyed or irritable 0 0 3  Afraid - awful might happen 0 0 3  Total GAD 7 Score 0 0 21  Anxiety Difficulty - Not difficult at all Extremely difficult      Past Medical History:  Diagnosis Date  . Acute respiratory failure ([place])   . GERD (gastroesophageal reflux disease)   . Hypertension   . Menopausal  vasomotor syndrome   . Obesity   . Pre-diabetes   . RSV (respiratory syncytial virus pneumonia)    Past Surgical History:  Procedure Laterality Date  . ANKLE ARTHROSCOPY Left 1985  . DILATION AND CURETTAGE OF UTERUS N/A 08/14/2017   Procedure: DILATATION AND CURETTAGE;  Surgeon: Emily Filbert, MD;  Location: [facility];  Service: Gynecology;  Laterality: N/A;   Social History   Tobacco Use  . Smoking status: Never Smoker  . Smokeless tobacco: Never Used  Substance Use Topics  . Alcohol use: Never    Frequency: Never   family history includes Breast cancer in her maternal grandmother; Heart disease in her father and mother; Hypertension in her mother.    ROS: negative except as noted in the HPI  Medications: Current Outpatient Medications  Medication Sig Dispense Refill  . aspirin EC 81 MG tablet Take 1 tablet (81 mg total) by mouth daily. 90 tablet 3  . Dexlansoprazole 30 MG capsule Take 1 capsule (30 mg total) by mouth daily. 90 capsule 1  . hydrochlorothiazide (HYDRODIURIL) 25 MG tablet Take 1 tablet (25 mg total) by mouth daily. 90 tablet 1  .  sertraline (ZOLOFT) 50 MG tablet TAKE 1 TABLET(50 MG) BY MOUTH AT BEDTIME 90 tablet 1   No current facility-administered medications for this visit.    No Known Allergies     Objective:  BP 132/86   Pulse 73   Wt 229 lb (103.9 kg)   BMI 39.31 kg/m  Gen:  alert, not ill-appearing, no distress, appropriate for age, obese female HEENT: head normocephalic without obvious abnormality, conjunctiva and cornea clear, wearing glasses, trachea midline Pulm: Normal work of breathing, normal phonation, clear to auscultation bilaterally, no wheezes, rales or rhonchi CV: Normal rate, regular rhythm, s1 and s2 distinct, no murmurs, clicks or rubs  Neuro: alert and oriented x 3, no tremor MSK: extremities atraumatic, normal gait and station Skin: intact, no rashes on exposed skin, no jaundice, no cyanosis Psych:  well-groomed, cooperative, good eye contact, euthymic mood, affect mood-congruent, speech is articulate, and thought processes clear and goal-directed    No results found for this or any previous visit (from the past 72 hour(s)). No results found.    Assessment and Plan: 57 y.o. female with   .Seira was seen today for anxiety and depression.  Diagnoses and all orders for this visit:  Encounter for medication management  Major depressive disorder with single episode, in full remission ([place]) -     sertraline (ZOLOFT) 50 MG tablet; TAKE 1 TABLET(50 MG) BY MOUTH AT BEDTIME  Breast cancer screening by mammogram -     MM 3D SCREEN BREAST BILATERAL; Future  Gastroesophageal reflux disease without esophagitis -     Dexlansoprazole 30 MG capsule; Take 1 capsule (30 mg total) by mouth daily.  Essential hypertension -     hydrochlorothiazide (HYDRODIURIL) 25 MG tablet; Take 1 tablet (25 mg total) by mouth daily.  Prediabetes  Postmenopausal  Class 2 severe obesity due to excess calories with serious comorbidity in adult, unspecified BMI (HCC)   Prediabetes, Obesity Lab Results  Component Value Date   HGBA1C 6.1 (H) 09/07/2017  - counseled on increased risk of type 2 diabetes and importance of therapeutic lifestyle changes, including weight loss - declines referral to nutritionist - recheck A1c in 5 monts  HTN BP Readings from Last 3 Encounters:  10/07/17 132/86  09/30/17 133/80  09/21/17 125/76  - counseled on therapeutic lifestyle changes - cont HCTZ 25 mg daily - baby asa for primary prevention   Patient education and anticipatory guidance given Patient agrees with treatment plan Follow-up in 6 months or sooner as needed if symptoms worsen or fail to improve  Darlyne Russian PA-C

## 2017-10-12 ENCOUNTER — Encounter: Payer: Self-pay | Admitting: *Deleted

## 2017-10-12 NOTE — Progress Notes (Signed)
Copy of genetic [facility] mailed to pt's home address with instructions to contact the genetic counselor @ [facility] to go over results.

## 2018-01-11 ENCOUNTER — Encounter: Payer: Self-pay | Admitting: Physician Assistant

## 2018-01-11 ENCOUNTER — Ambulatory Visit (INDEPENDENT_AMBULATORY_CARE_PROVIDER_SITE_OTHER): Payer: 59

## 2018-01-11 ENCOUNTER — Ambulatory Visit (INDEPENDENT_AMBULATORY_CARE_PROVIDER_SITE_OTHER): Payer: 59 | Admitting: Physician Assistant

## 2018-01-11 VITALS — BP 128/82 | HR 92 | Temp 97.6°F | Wt 220.4 lb

## 2018-01-11 DIAGNOSIS — J22 Unspecified acute lower respiratory infection: Secondary | ICD-10-CM

## 2018-01-11 DIAGNOSIS — Z8701 Personal history of pneumonia (recurrent): Secondary | ICD-10-CM

## 2018-01-11 DIAGNOSIS — R9389 Abnormal findings on diagnostic imaging of other specified body structures: Secondary | ICD-10-CM | POA: Diagnosis not present

## 2018-01-11 DIAGNOSIS — K449 Diaphragmatic hernia without obstruction or gangrene: Secondary | ICD-10-CM

## 2018-01-11 DIAGNOSIS — K219 Gastro-esophageal reflux disease without esophagitis: Secondary | ICD-10-CM | POA: Insufficient documentation

## 2018-01-11 MED ORDER — PREDNISONE 50 MG PO TABS: ORAL_TABLET | ORAL | 0 refills | Status: DC

## 2018-01-11 MED ORDER — AZITHROMYCIN 250 MG PO TABS: ORAL_TABLET | ORAL | 0 refills | Status: DC

## 2018-01-11 NOTE — Progress Notes (Signed)
HPI:                                                                Megan [name] is a 57 y.o. female who presents to [facility]: [facility] today for URI symptoms  Cough  This is a new problem. The current episode started 1 to 4 weeks ago (x 2 weeks). The problem has been unchanged. The problem occurs every few minutes. The cough is productive of sputum. Associated symptoms include ear congestion, headaches, rhinorrhea, a sore throat (resolved) and shortness of breath. Nothing aggravates the symptoms. Treatments tried: ibuprofen. The treatment provided no relief. Her past medical history is significant for pneumonia.      Past Medical History:  Diagnosis Date  . Acute respiratory failure ([place])   . GERD (gastroesophageal reflux disease)   . Hypertension   . Menopausal vasomotor syndrome   . Obesity   . Pre-diabetes   . RSV (respiratory syncytial virus pneumonia)    Past Surgical History:  Procedure Laterality Date  . ANKLE ARTHROSCOPY Left 1985  . DILATION AND CURETTAGE OF UTERUS N/A 08/14/2017   Procedure: DILATATION AND CURETTAGE;  Surgeon: Megan Filbert, MD;  Location: [facility];  Service: Gynecology;  Laterality: N/A;   Social History   Tobacco Use  . Smoking status: Never Smoker  . Smokeless tobacco: Never Used  Substance Use Topics  . Alcohol use: Never    Frequency: Never   family history includes Breast cancer in her maternal grandmother; Heart disease in her father and mother; Hypertension in her mother.    ROS: negative except as noted in the HPI  Medications: Current Outpatient Medications  Medication Sig Dispense Refill  . aspirin EC 81 MG tablet Take 1 tablet (81 mg total) by mouth daily. 90 tablet 3  . Dexlansoprazole 30 MG capsule Take 1 capsule (30 mg total) by mouth daily. 90 capsule 1  . hydrochlorothiazide (HYDRODIURIL) 25 MG tablet Take 1 tablet (25 mg total) by mouth daily. 90 tablet 1  .  sertraline (ZOLOFT) 50 MG tablet TAKE 1 TABLET(50 MG) BY MOUTH AT BEDTIME 90 tablet 1   No current facility-administered medications for this visit.    No Known Allergies     Objective:  BP 128/82   Pulse 92   Temp 97.6 F (36.4 C) (Oral)   Wt 220 lb 6.4 oz (100 kg)   SpO2 98%   BMI 37.83 kg/m  Gen:  alert, ill-appearing, not toxic-appearing, no distress, appropriate for age 26: head normocephalic without obvious abnormality, conjunctiva and cornea clear, TM's pearly gray and semitransparent, nasal mucosa edematous, oropharynx clear, moist mucous membranes, no sinus tenderness, no cervical adenopathy, trachea midline Pulm: Normal work of breathing, normal phonation, inspiratory wheeze and coarse breath sounds overlying left lower lung fields CV: Normal rate, regular rhythm, s1 and s2 distinct, no murmurs, clicks or rubs  Neuro: alert and oriented x 3, no tremor MSK: extremities atraumatic, normal gait and station Skin: intact, no rashes on exposed skin, no jaundice, no cyanosis     No results found for this or any previous visit (from the past 72 hour(s)). No results found.    Assessment and Plan: 57 y.o. female with   .Megan [name] was seen  today for sinus problem.  Diagnoses and all orders for this visit:  Acute lower respiratory infection -     azithromycin (ZITHROMAX Z-PAK) 250 MG tablet; Take 2 tablets (500 mg) on  Day 1,  followed by 1 tablet (250 mg) once daily on Days 2 through 5. -     predniSONE (DELTASONE) 50 MG tablet; One tab PO daily for 5 days. -     DG Chest 2 View  Afebrile, no tachypnea, no tachycardia, pulse ox 98% on room air.  Inspiratory wheeze and coarse breath sounds appreciated over the left lower lung fields on exam.  Megan [name] has a history of RSV pneumonia and acute respiratory failure, she also had abnormality in the left lower lobe on her chest x-ray in 2018, for these reasons I am going to treat her aggressively with azithromycin and prednisone.  CXR pending. She was instructed to let us know if she is not feeling improved in the next 48 to 72 hours.   Patient education and anticipatory guidance given Patient agrees with treatment plan Follow-up as needed if symptoms worsen or fail to improve  Megan Russian PA-C

## 2018-01-11 NOTE — Patient Instructions (Signed)
Viral Respiratory Infection A respiratory infection is an illness that affects part of the respiratory system, such as the lungs, nose, or throat. Most respiratory infections are caused by either viruses or bacteria. A respiratory infection that is caused by a virus is called a viral respiratory infection. Common types of viral respiratory infections include:  A cold.  The flu (influenza).  A respiratory syncytial virus (RSV) infection.  How do I know if I have a viral respiratory infection? Most viral respiratory infections cause:  A stuffy or runny nose.  Yellow or green nasal discharge.  A cough.  Sneezing.  Fatigue.  Achy muscles.  A sore throat.  Sweating or chills.  A fever.  A headache.  How are viral respiratory infections treated? If influenza is diagnosed early, it may be treated with an antiviral medicine that shortens the length of time a person has symptoms. Symptoms of viral respiratory infections may be treated with over-the-counter and prescription medicines, such as:  Expectorants. These make it easier to cough up mucus.  Decongestant nasal sprays.  Health care providers do not prescribe antibiotic medicines for viral infections. This is because antibiotics are designed to kill bacteria. They have no effect on viruses. How do I know if I should stay home from work or school? To avoid exposing others to your respiratory infection, stay home if you have:  A fever.  A persistent cough.  A sore throat.  A runny nose.  Sneezing.  Muscles aches.  Headaches.  Fatigue.  Weakness.  Chills.  Sweating.  Nausea.  Follow these instructions at home:  Rest as much as possible.  Take over-the-counter and prescription medicines only as told by your health care provider.  Drink enough fluid to keep your urine clear or pale yellow. This helps prevent dehydration and helps loosen up mucus.  Gargle with a salt-water mixture 3-4 times per day or  as needed. To make a salt-water mixture, completely dissolve -1 tsp of salt in 1 cup of warm water.  Use nose drops made from salt water to ease congestion and soften raw skin around your nose.  Do not drink alcohol.  Do not use tobacco products, including cigarettes, chewing tobacco, and e-cigarettes. If you need help quitting, ask your health care provider. Contact a health care provider if:  Your symptoms last for 10 days or longer.  Your symptoms get worse over time.  You have a fever.  You have severe sinus pain in your face or forehead.  The glands in your jaw or neck become very swollen. Get help right away if:  You feel pain or pressure in your chest.  You have shortness of breath.  You faint or feel like you will faint.  You have severe and persistent vomiting.  You feel confused or disoriented. This information is not intended to replace advice given to you by your health care provider. Make sure you discuss any questions you have with your health care provider. Document Released: 11/13/2004 Document Revised: 07/12/2015 Document Reviewed: 07/12/2014 Elsevier Interactive Patient Education  [employer].

## 2018-01-12 ENCOUNTER — Encounter: Payer: Self-pay | Admitting: Physician Assistant

## 2018-01-13 ENCOUNTER — Other Ambulatory Visit: Payer: Self-pay | Admitting: Physician Assistant

## 2018-01-13 DIAGNOSIS — K219 Gastro-esophageal reflux disease without esophagitis: Secondary | ICD-10-CM

## 2018-01-13 DIAGNOSIS — K449 Diaphragmatic hernia without obstruction or gangrene: Principal | ICD-10-CM

## 2018-01-22 ENCOUNTER — Encounter: Payer: Self-pay | Admitting: Physician Assistant

## 2018-01-22 DIAGNOSIS — J22 Unspecified acute lower respiratory infection: Secondary | ICD-10-CM

## 2018-01-22 MED ORDER — CEFDINIR 300 MG PO CAPS: 300.0000 mg | ORAL_CAPSULE | Freq: Two times a day (BID) | ORAL | 0 refills | Status: AC

## 2018-01-22 MED ORDER — PREDNISONE 10 MG (48) PO TBPK: ORAL_TABLET | Freq: Every day | ORAL | 0 refills | Status: DC

## 2018-01-27 ENCOUNTER — Encounter: Payer: Self-pay | Admitting: Physician Assistant

## 2018-01-27 DIAGNOSIS — N760 Acute vaginitis: Secondary | ICD-10-CM

## 2018-01-28 MED ORDER — FLUCONAZOLE 150 MG PO TABS: 150.0000 mg | ORAL_TABLET | Freq: Once | ORAL | 0 refills | Status: AC

## 2018-02-01 ENCOUNTER — Other Ambulatory Visit: Payer: Self-pay | Admitting: Physician Assistant

## 2018-02-01 DIAGNOSIS — F325 Major depressive disorder, single episode, in full remission: Secondary | ICD-10-CM

## 2018-02-19 ENCOUNTER — Ambulatory Visit (INDEPENDENT_AMBULATORY_CARE_PROVIDER_SITE_OTHER): Payer: 59

## 2018-02-19 DIAGNOSIS — Z1231 Encounter for screening mammogram for malignant neoplasm of breast: Secondary | ICD-10-CM | POA: Diagnosis not present

## 2018-02-23 ENCOUNTER — Ambulatory Visit: Payer: 59 | Admitting: Physician Assistant

## 2018-02-23 ENCOUNTER — Encounter: Payer: Self-pay | Admitting: Physician Assistant

## 2018-02-23 VITALS — BP 119/78 | HR 84 | Temp 98.7°F | Wt 229.0 lb

## 2018-02-23 DIAGNOSIS — H66001 Acute suppurative otitis media without spontaneous rupture of ear drum, right ear: Secondary | ICD-10-CM

## 2018-02-23 DIAGNOSIS — H9191 Unspecified hearing loss, right ear: Secondary | ICD-10-CM | POA: Insufficient documentation

## 2018-02-23 MED ORDER — FLUCONAZOLE 150 MG PO TABS
150.0000 mg | ORAL_TABLET | Freq: Once | ORAL | 0 refills | Status: AC
Start: 2018-02-23 — End: 2018-02-23

## 2018-02-23 MED ORDER — AMOXICILLIN 500 MG PO TABS: 500.0000 mg | ORAL_TABLET | Freq: Two times a day (BID) | ORAL | 0 refills | Status: DC

## 2018-02-23 NOTE — Patient Instructions (Signed)
For ear pain: Tylenol 1052m every 8 hours as needed. Alternate with Ibuprofen 6027mevery 6 hours   Otitis Media, Adult  Otitis media occurs when there is inflammation and fluid in the middle ear. Your middle ear is a part of the ear that contains bones for hearing as well as air that helps send sounds to your brain. What are the causes? This condition is caused by a blockage in the eustachian tube. This tube drains fluid from the ear to the back of the nose (nasopharynx). A blockage in this tube can be caused by an object or by swelling (edema) in the tube. Problems that can cause a blockage include:  A cold or other upper respiratory infection.  Allergies.  An irritant, such as tobacco smoke.  Enlarged adenoids. The adenoids are areas of soft tissue located high in the back of the throat, behind the nose and the roof of the mouth.  A mass in the nasopharynx.  Damage to the ear caused by pressure changes (barotrauma). What are the signs or symptoms? Symptoms of this condition include:  Ear pain.  A fever.  Decreased hearing.  A headache.  Tiredness (lethargy).  Fluid leaking from the ear.  Ringing in the ear. How is this diagnosed? This condition is diagnosed with a physical exam. During the exam your health care provider will use an instrument called an otoscope to look into your ear and check for redness, swelling, and fluid. He or she will also ask about your symptoms. Your health care provider may also order tests, such as:  A test to check the movement of the eardrum (pneumatic otoscopy). This test is done by squeezing a small amount of air into the ear.  A test that changes air pressure in the middle ear to check how well the eardrum moves and whether the eustachian tube is working (tympanogram). How is this treated? This condition usually goes away on its own within 3-5 days. But if the condition is caused by a bacteria infection and does not go away own its own,  or keeps coming back, your health care provider may:  Prescribe antibiotic medicines to treat the infection.  Prescribe or recommend medicines to control pain. Follow these instructions at home:  Take over-the-counter and prescription medicines only as told by your health care provider.  If you were prescribed an antibiotic medicine, take it as told by your health care provider. Do not stop taking the antibiotic even if you start to feel better.  Keep all follow-up visits as told by your health care provider. This is important. Contact a health care provider if:  You have bleeding from your nose.  There is a lump on your neck.  You are not getting better in 5 days.  You feel worse instead of better. Get help right away if:  You have severe pain that is not controlled with medicine.  You have swelling, redness, or pain around your ear.  You have stiffness in your neck.  A part of your face is paralyzed.  The bone behind your ear (mastoid) is tender when you touch it.  You develop a severe headache. Summary  Otitis media is redness, soreness, and swelling of the middle ear.  This condition usually goes away on its own within 3-5 days.  If the problem does not go away in 3-5 days, your health care provider may prescribe or recommend medicines to treat your symptoms.  If you were prescribed an antibiotic medicine, take it  as told by your health care provider. This information is not intended to replace advice given to you by your health care provider. Make sure you discuss any questions you have with your health care provider. Document Released: 11/09/2003 Document Revised: 01/25/2016 Document Reviewed: 01/25/2016 Elsevier Interactive Patient Education  2019 [employer].

## 2018-02-23 NOTE — Progress Notes (Signed)
HPI:                                                                Megan [name] is a 58 y.o. female who presents to [facility] [facility]: [place] today for right otalgia  Otalgia   There is pain in the right ear. This is a new problem. The current episode started in the past 7 days (x 3 days). The problem occurs constantly. The problem has been unchanged. There has been no fever. Associated symptoms include hearing loss and a sore throat (resolved). Pertinent negatives include no coughing, ear discharge, headaches, neck pain or rhinorrhea. Associated symptoms comments: + tender lymph node on right side. She has tried NSAIDs for the symptoms. The treatment provided mild relief.     Past Medical History:  Diagnosis Date  . Acute respiratory failure ([place])   . GERD (gastroesophageal reflux disease)   . Hypertension   . Menopausal vasomotor syndrome   . Obesity   . Pre-diabetes   . RSV (respiratory syncytial virus pneumonia)    Past Surgical History:  Procedure Laterality Date  . ANKLE ARTHROSCOPY Left 1985  . DILATION AND CURETTAGE OF UTERUS N/A 08/14/2017   Procedure: DILATATION AND CURETTAGE;  Surgeon: Megan Filbert, MD;  Location: [facility];  Service: Gynecology;  Laterality: N/A;   Social History   Tobacco Use  . Smoking status: Never Smoker  . Smokeless tobacco: Never Used  Substance Use Topics  . Alcohol use: Never    Frequency: Never   family history includes Breast cancer in her maternal grandmother; Heart disease in her father and mother; Hypertension in her mother.    ROS: negative except as noted in the HPI  Medications: Current Outpatient Medications  Medication Sig Dispense Refill  . amoxicillin (AMOXIL) 500 MG tablet Take 1 tablet (500 mg total) by mouth 2 (two) times daily for 7 days. 14 tablet 0  . aspirin EC 81 MG tablet Take 1 tablet (81 mg total) by mouth daily. 90 tablet 3  . Dexlansoprazole 30 MG capsule  Take 1 capsule (30 mg total) by mouth daily. 90 capsule 1  . fluconazole (DIFLUCAN) 150 MG tablet Take 1 tablet (150 mg total) by mouth once for 1 dose. May repeat 1 add'l dose in 72 hours if symptoms persist/recur 2 tablet 0  . hydrochlorothiazide (HYDRODIURIL) 25 MG tablet Take 1 tablet (25 mg total) by mouth daily. 90 tablet 1  . sertraline (ZOLOFT) 50 MG tablet TAKE 1 TABLET(50 MG) BY MOUTH AT BEDTIME 90 tablet 1   No current facility-administered medications for this visit.    No Known Allergies     Objective:  BP 119/78   Pulse 84   Temp 98.7 F (37.1 C) (Oral)   Wt 229 lb (103.9 kg)   SpO2 94%   BMI 39.31 kg/m  Gen:  alert, appears uncomfortable, no acute distress, appropriate for age HEENT: head normocephalic without obvious abnormality, conjunctiva and cornea clear, normal right external ear and ear canal, right TM injected with purulent effusion, no mastoid tenderness, no pre- or post-auricular adenopathy, hearing is not intact to finger-rub on the right  Pulm: Normal work of breathing, normal phonation Neuro: alert and oriented x 3, no tremor MSK:  extremities atraumatic, normal gait and station Skin: intact, no rashes on exposed skin, no jaundice, no cyanosis    No results found for this or any previous visit (from the past 72 hour(s)). Mm 3d Screen Breast Bilateral  Result Date: 02/19/2018 CLINICAL DATA:  Screening. EXAM: DIGITAL SCREENING BILATERAL MAMMOGRAM WITH TOMO AND CAD COMPARISON:  None. ACR Breast Density Category b: There are scattered areas of fibroglandular density. FINDINGS: There are no findings suspicious for malignancy. Images were processed with CAD. IMPRESSION: No mammographic evidence of malignancy. A result letter of this screening mammogram will be mailed directly to the patient. RECOMMENDATION: Screening mammogram in one year. (Code:[id number]) BI-RADS CATEGORY  1: Negative. Electronically Signed   By: Megan [name] M.D.   On: 02/19/2018 16:08       Assessment and Plan: 58 y.o. female with   .Megan [name] was seen today for ear pain.  Diagnoses and all orders for this visit:  Non-recurrent acute suppurative otitis media of right ear without spontaneous rupture of tympanic membrane -     amoxicillin (AMOXIL) 500 MG tablet; Take 1 tablet (500 mg total) by mouth 2 (two) times daily for 7 days.  Hearing loss of right ear, unspecified hearing loss type  Other orders -     fluconazole (DIFLUCAN) 150 MG tablet; Take 1 tablet (150 mg total) by mouth once for 1 dose. May repeat 1 add'l dose in 72 hours if symptoms persist/recur   Hearing loss likely conductive due to acute infection/effusion Return precautions discussed    Patient education and anticipatory guidance given Patient agrees with treatment plan Follow-up in 1 week for hearing loss or sooner as needed if symptoms worsen or fail to improve  Megan Russian PA-C

## 2018-02-25 ENCOUNTER — Encounter: Payer: Self-pay | Admitting: Physician Assistant

## 2018-03-02 ENCOUNTER — Ambulatory Visit (INDEPENDENT_AMBULATORY_CARE_PROVIDER_SITE_OTHER): Payer: 59 | Admitting: Physician Assistant

## 2018-03-02 ENCOUNTER — Encounter: Payer: Self-pay | Admitting: Physician Assistant

## 2018-03-02 VITALS — BP 116/78 | HR 88 | Wt 226.0 lb

## 2018-03-02 DIAGNOSIS — Z6838 Body mass index (BMI) 38.0-38.9, adult: Secondary | ICD-10-CM | POA: Diagnosis not present

## 2018-03-02 DIAGNOSIS — Z7689 Persons encountering health services in other specified circumstances: Secondary | ICD-10-CM | POA: Diagnosis not present

## 2018-03-02 DIAGNOSIS — Z01110 Encounter for hearing examination following failed hearing screening: Secondary | ICD-10-CM | POA: Diagnosis not present

## 2018-03-02 NOTE — Progress Notes (Signed)
HPI:                                                                Gracyn Allor is a 58 y.o. female who presents to [facility]: [place] today for hearing loss  Diagnosed with acute suppurative otitis media of right ear 1 week ago. At the time, presented with right-sided hearing loss. Presents today for follow-up. Reports ear pain has resolved with antibiotic therapy and hearing returned several days ago  She would like to discuss weight loss today Weight gain: Current weight 226 lb Period of greatest weight gain was in the last 6 years Healthiest adult weight was 200 lb  Goal: 160 lb, last weighed this in her early 20's Prior weight loss attempts: traditional diet/exercise  Diet:  eats breakfast and lunch, does not eat supper Breakfast - bagel w/cream cheese  Lunch - [employer], sandwich and chips, sometimes salads Eats fast food several times per week Snacks on cookies and chips, sometimes bananas and apples Drinks: Diet Dr. Malachi Bonds 1-2, 12 oz bottles, sweet tea 32 oz Exercise: walks 10-15 min/day, plays outside with her dog   Past Medical History:  Diagnosis Date  . Acute respiratory failure ([place] [facility])   . GERD (gastroesophageal reflux disease)   . Hypertension   . Menopausal vasomotor syndrome   . Obesity   . Pre-diabetes   . RSV (respiratory syncytial virus pneumonia)    Past Surgical History:  Procedure Laterality Date  . ANKLE ARTHROSCOPY Left 1985  . DILATION AND CURETTAGE OF UTERUS N/A 08/14/2017   Procedure: DILATATION AND CURETTAGE;  Surgeon: Emily Filbert, MD;  Location: [facility];  Service: Gynecology;  Laterality: N/A;   Social History   Tobacco Use  . Smoking status: Never Smoker  . Smokeless tobacco: Never Used  Substance Use Topics  . Alcohol use: Never    Frequency: Never   family history includes Breast cancer in her maternal grandmother; Heart disease in her father and mother; Hypertension in her  mother.    ROS: negative except as noted in the HPI  Medications: Current Outpatient Medications  Medication Sig Dispense Refill  . amoxicillin (AMOXIL) 500 MG tablet Take 1 tablet (500 mg total) by mouth 2 (two) times daily for 7 days. 14 tablet 0  . aspirin EC 81 MG tablet Take 1 tablet (81 mg total) by mouth daily. 90 tablet 3  . Dexlansoprazole 30 MG capsule Take 1 capsule (30 mg total) by mouth daily. 90 capsule 1  . hydrochlorothiazide (HYDRODIURIL) 25 MG tablet Take 1 tablet (25 mg total) by mouth daily. 90 tablet 1  . sertraline (ZOLOFT) 50 MG tablet TAKE 1 TABLET(50 MG) BY MOUTH AT BEDTIME 90 tablet 1   No current facility-administered medications for this visit.    No Known Allergies     Objective:  BP 116/78   Pulse 88   Wt 226 lb (102.5 kg)   BMI 38.79 kg/m  Gen:  alert, not ill-appearing, no distress, appropriate for age, obese female HEENT: head normocephalic without obvious abnormality, conjunctiva and cornea clear, wearing glasses, TM"s pearly gray and semitransparent bilaterally, trachea midline Pulm: Normal work of breathing, normal phonation Neuro: alert and oriented x 3, no tremor MSK: extremities atraumatic, normal  gait and station Skin: intact, no rashes on exposed skin, no jaundice, no cyanosis Psych: well-groomed, cooperative, good eye contact, euthymic mood, affect mood-congruent, speech is articulate, and thought processes clear and goal-directed    No results found for this or any previous visit (from the past 72 hour(s)). No results found.    Assessment and Plan: 58 y.o. female with   .[place] was seen today for follow-up.  Diagnoses and all orders for this visit:  Encounter for hearing examination after failed hearing screening  Encounter for weight management  Class 2 severe obesity due to excess calories with serious comorbidity and body mass index (BMI) of 38.0 to 38.9 in adult [facility])   Acute right-sided hearing loss: Passed 20 dB  hearing screen today. Resolution of acute OM  Obesity, Class 2: Detailed discussion with patient about pathophysiology of obesity. We reviewed her current diet and lifestyle and discussed her goals using motivational interviewing. She is namely going to focus on reducing and eventually eliminating her consumption of sweet tea. We discussed she could lose up to 29 lb/year with just this change alone. She was provided with information on pharmacologic options and she will contact her insurance regarding coverage.    Patient education and anticipatory guidance given Patient agrees with treatment plan Follow-up in 6 weeks for weight mgmt or sooner as needed if symptoms worsen or fail to improve  Darlyne Russian PA-C

## 2018-03-03 ENCOUNTER — Encounter: Payer: Self-pay | Admitting: Physician Assistant

## 2018-03-04 MED ORDER — PHENTERMINE HCL 15 MG PO CAPS: 15.0000 mg | ORAL_CAPSULE | ORAL | 0 refills | Status: DC

## 2018-04-09 ENCOUNTER — Ambulatory Visit: Payer: 59 | Admitting: Physician Assistant

## 2018-04-14 ENCOUNTER — Encounter: Payer: Self-pay | Admitting: Physician Assistant

## 2018-04-14 ENCOUNTER — Ambulatory Visit: Payer: 59 | Admitting: Physician Assistant

## 2018-04-14 VITALS — BP 131/85 | HR 88 | Wt 224.0 lb

## 2018-04-14 DIAGNOSIS — I10 Essential (primary) hypertension: Secondary | ICD-10-CM

## 2018-04-14 DIAGNOSIS — Z79899 Other long term (current) drug therapy: Secondary | ICD-10-CM | POA: Diagnosis not present

## 2018-04-14 DIAGNOSIS — R7303 Prediabetes: Secondary | ICD-10-CM | POA: Diagnosis not present

## 2018-04-14 DIAGNOSIS — F329 Major depressive disorder, single episode, unspecified: Secondary | ICD-10-CM

## 2018-04-14 DIAGNOSIS — F32A Depression, unspecified: Secondary | ICD-10-CM

## 2018-04-14 DIAGNOSIS — K219 Gastro-esophageal reflux disease without esophagitis: Secondary | ICD-10-CM | POA: Diagnosis not present

## 2018-04-14 LAB — POCT GLYCOSYLATED HEMOGLOBIN (HGB A1C): HbA1c, POC (prediabetic range): 5.8 % (ref 5.7–6.4)

## 2018-04-14 MED ORDER — DEXLANSOPRAZOLE 30 MG PO CPDR: 30.0000 mg | DELAYED_RELEASE_CAPSULE | Freq: Every day | ORAL | 1 refills | Status: DC

## 2018-04-14 MED ORDER — HYDROCHLOROTHIAZIDE 25 MG PO TABS
25.0000 mg | ORAL_TABLET | Freq: Every day | ORAL | 1 refills | Status: DC
Start: 2018-04-14 — End: 2018-06-01

## 2018-04-14 MED ORDER — PHENTERMINE HCL 15 MG PO CAPS: 15.0000 mg | ORAL_CAPSULE | Freq: Every day | ORAL | 0 refills | Status: DC

## 2018-04-14 NOTE — Progress Notes (Signed)
HPI:                                                                Megan [name] is a 57 y.o. female who presents to [place]: [place] today for med management  HTN: taking HCTZ 25 mg daily. Compliant with medications. BP range 130's/80's when she checks occasionally at [facility] or pharmacy. Denies vision change, headache, chest pain with exertion, orthopnea, lightheadedness, syncope and edema. Risk factors include: obesity, post-menopausal  Depression/Anxiety: taking sertraline 50 mg without difficulty. Denies depressed mood or anhedonia. Reports she is feeling like herself again. Sleeping well. Denies symptoms of mania/hypomania. Denies suicidal thinking. Denies auditory/visual hallucinations.  Menopausal vasomotor symptoms: has not had any hot flashes.  GERD: was having breakthrough symptoms on Pantoprazole and Omeprazole. Switched to [employer] and is having no symptoms. Denies dysphagia, odynophagia, forceful vomiting, melena/hematochezia.  Prediabetes:  Diet - has not changed what she is eating, but is eating smaller portions. First meal of the day is noon and dinner at usual time. Skips breakfast. Has given up sweet tea. Drinking diet soda and water. Exercise - does not exercise, no change in activity, plays outside with her border collie  Depression screen [facility] 2/9 04/14/2018 10/07/2017 07/06/2017 06/15/2017 06/04/2017  Decreased Interest 0 0 0 3 0  Down, Depressed, Hopeless 0 0 0 3 0  PHQ - 2 Score 0 0 0 6 0  Altered sleeping - - 0 1 -  Tired, decreased energy - - 0 3 -  Change in appetite - - 0 3 -  Feeling bad or failure about yourself  - - 0 3 -  Trouble concentrating - - 0 3 -  Moving slowly or fidgety/restless - - 0 1 -  Suicidal thoughts - - 0 3 -  PHQ-9 Score - - 0 23 -  Difficult doing work/chores - - Not difficult at all Extremely dIfficult -    GAD 7 : Generalized Anxiety Score 10/07/2017 07/06/2017 06/15/2017  Nervous, Anxious,  on Edge 0 0 3  Control/stop worrying 0 0 3  Worry too much - different things 0 0 3  Trouble relaxing 0 0 3  Restless 0 0 3  Easily annoyed or irritable 0 0 3  Afraid - awful might happen 0 0 3  Total GAD 7 Score 0 0 21  Anxiety Difficulty - Not difficult at all Extremely difficult      Past Medical History:  Diagnosis Date  . Acute respiratory failure ([place])   . GERD (gastroesophageal reflux disease)   . Hypertension   . Menopausal vasomotor syndrome   . Obesity   . Pre-diabetes   . RSV (respiratory syncytial virus pneumonia)    Past Surgical History:  Procedure Laterality Date  . ANKLE ARTHROSCOPY Left 1985  . DILATION AND CURETTAGE OF UTERUS N/A 08/14/2017   Procedure: DILATATION AND CURETTAGE;  Surgeon: Emily Filbert, MD;  Location: [facility];  Service: Gynecology;  Laterality: N/A;   Social History   Tobacco Use  . Smoking status: Never Smoker  . Smokeless tobacco: Never Used  Substance Use Topics  . Alcohol use: Never    Frequency: Never   family history includes Breast cancer in her maternal grandmother; Heart disease in  her father and mother; Hypertension in her mother.    ROS: negative except as noted in the HPI  Medications: Current Outpatient Medications  Medication Sig Dispense Refill  . aspirin EC 81 MG tablet Take 1 tablet (81 mg total) by mouth daily. 90 tablet 3  . Dexlansoprazole 30 MG capsule Take 1 capsule (30 mg total) by mouth daily. 90 capsule 1  . hydrochlorothiazide (HYDRODIURIL) 25 MG tablet Take 1 tablet (25 mg total) by mouth daily. 90 tablet 1  . phentermine 15 MG capsule Take 1 capsule (15 mg total) by mouth daily with lunch. 30 capsule 0  . sertraline (ZOLOFT) 50 MG tablet TAKE 1 TABLET(50 MG) BY MOUTH AT BEDTIME 90 tablet 1   No current facility-administered medications for this visit.    No Known Allergies     Objective:  BP 131/85   Pulse 88   Wt 224 lb (101.6 kg)   BMI 38.45 kg/m  Gen:  alert, not  ill-appearing, no distress, appropriate for age, obese female HEENT: head normocephalic without obvious abnormality, conjunctiva and cornea clear, wearing glasses, trachea midline Pulm: Normal work of breathing, normal phonation, clear to auscultation bilaterally, no wheezes, rales or rhonchi CV: Normal rate, regular rhythm, s1 and s2 distinct, no murmurs, clicks or rubs  Neuro: alert and oriented x 3, no tremor MSK: extremities atraumatic, normal gait and station Skin: intact, no rashes on exposed skin, no jaundice, no cyanosis Psych: well-groomed, cooperative, good eye contact, euthymic mood, affect mood-congruent, speech is articulate, and thought processes clear and goal-directed  Lab Results  Component Value Date   CREATININE 0.86 09/07/2017   BUN 9 09/07/2017   NA 140 09/07/2017   K 4.3 09/07/2017   CL 99 09/07/2017   CO2 34 (H) 09/07/2017   Lab Results  Component Value Date   ALT 19 09/07/2017   AST 22 09/07/2017   BILITOT 0.7 09/07/2017   Lab Results  Component Value Date   HGBA1C 5.8 04/14/2018   Lab Results  Component Value Date   CHOL 211 (H) 07/07/2017   HDL 71 07/07/2017   LDLCALC 106 (H) 07/07/2017   TRIG 217 (H) 07/07/2017   CHOLHDL 3.0 07/07/2017   The 10-year ASCVD risk score Mikey Bussing DC Jr., et al., 2013) is: 3.2%   Values used to calculate the score:     Age: 65 years     Sex: Female     Is Non-Hispanic African American: No     Diabetic: No     Tobacco smoker: No     Systolic Blood Pressure: 765 mmHg     Is BP treated: Yes     HDL Cholesterol: 71 mg/dL     Total Cholesterol: 211 mg/dL    No results found for this or any previous visit (from the past 72 hour(s)). No results found.    Assessment and Plan: 58 y.o. female with   .Megan [name] was seen today for weight check.  Diagnoses and all orders for this visit:  Encounter for medication management  Class 2 severe obesity due to excess calories with serious comorbidity in adult, unspecified  BMI (HCC) -     phentermine 15 MG capsule; Take 1 capsule (15 mg total) by mouth daily with lunch. -     POCT HgB A1C  Essential hypertension -     hydrochlorothiazide (HYDRODIURIL) 25 MG tablet; Take 1 tablet (25 mg total) by mouth daily.  Gastroesophageal reflux disease without esophagitis -     Dexlansoprazole 30  MG capsule; Take 1 capsule (30 mg total) by mouth daily.  Prediabetes -     POCT HgB A1C  Depressive disorder in remission   Prediabetes, Obesity Lab Results  Component Value Date   HGBA1C 5.8 04/14/2018  - A1c improved 0.3 points - applauded for eliminating sweat tea - prediabetes eating plan reviewed   HTN BP Readings from Last 3 Encounters:  04/14/18 131/85  03/02/18 116/78  02/23/18 119/78  - counseled on therapeutic lifestyle changes - cont HCTZ 25 mg daily - baby asa for primary prevention  GERD, hiatal hernia - she was referred to GI to discuss hiatal hernia repair but is not interested in surgical options at this time - cont Dexilant 30 mg QD, risks and benefits of long-term PPI use reviewed  Obesity - prediabetes eating plan - cont Phentermine for an additional 1-2 months. Counseled to dose Phentermine in the mid-day / lunch time since she skips breakfast  Depressive disorder in remission - related to menopause - PHQ9=0 - cont Sertraline 50 mg  Patient education and anticipatory guidance given Patient agrees with treatment plan Follow-up in 6 months or sooner as needed if symptoms worsen or fail to improve  Darlyne Russian PA-C

## 2018-04-14 NOTE — Patient Instructions (Addendum)
Limit carbs each meal to about 30g  Limit snack to about 15g of carbs Divide plate so that half is non-starchy vegetable, quarter is lean protein and a quarter is whole grain   Prediabetes Eating Plan Prediabetes is a condition that causes blood sugar (glucose) levels to be higher than normal. This increases the risk for developing diabetes. In order to prevent diabetes from developing, your health care provider may recommend a diet and other lifestyle changes to help you:  Control your blood glucose levels.  Improve your cholesterol levels.  Manage your blood pressure. Your health care provider may recommend working with a diet and nutrition specialist (dietitian) to make a meal plan that is best for you. What are tips for following this plan? Lifestyle  Set weight loss goals with the help of your health care team. It is recommended that most people with prediabetes lose 7% of their current body weight.  Exercise for at least 30 minutes at least 5 days a week.  Attend a support group or seek ongoing support from a mental health counselor.  Take over-the-counter and prescription medicines only as told by your health care provider. Reading food labels  Read food labels to check the amount of fat, salt (sodium), and sugar in prepackaged foods. Avoid foods that have: ? Saturated fats. ? Trans fats. ? Added sugars.  Avoid foods that have more than 300 milligrams (mg) of sodium per serving. Limit your daily sodium intake to less than 2,300 mg each day. Shopping  Avoid buying pre-made and processed foods. Cooking  Cook with olive oil. Do not use butter, lard, or ghee.  Bake, broil, grill, or boil foods. Avoid frying. Meal planning   Work with your dietitian to develop an eating plan that is right for you. This may include: ? Tracking how many calories you take in. Use a food diary, notebook, or mobile application to track what you eat at each meal. ? Using the glycemic index  (GI) to plan your meals. The index tells you how quickly a food will raise your blood glucose. Choose low-GI foods. These foods take a longer time to raise blood glucose.  Consider following a Mediterranean diet. This diet includes: ? Several servings each day of fresh fruits and vegetables. ? Eating fish at least twice a week. ? Several servings each day of whole grains, beans, nuts, and seeds. ? Using olive oil instead of other fats. ? Moderate alcohol consumption. ? Eating small amounts of red meat and whole-fat dairy.  If you have high blood pressure, you may need to limit your sodium intake or follow a diet such as the DASH eating plan. DASH is an eating plan that aims to lower high blood pressure. What foods are recommended? The items listed below may not be a complete list. Talk with your dietitian about what dietary choices are best for you. Grains Whole grains, such as whole-wheat or whole-grain breads, crackers, cereals, and pasta. Unsweetened oatmeal. Bulgur. Barley. Quinoa. Brown rice. Corn or whole-wheat flour tortillas or taco shells. Vegetables Lettuce. Spinach. Peas. Beets. Cauliflower. Cabbage. Broccoli. Carrots. Tomatoes. Squash. Eggplant. Herbs. Peppers. Onions. Cucumbers. Brussels sprouts. Fruits Berries. Bananas. Apples. Oranges. Grapes. Papaya. Mango. Pomegranate. Kiwi. Grapefruit. Cherries. Meats and other protein foods Seafood. Poultry without skin. Lean cuts of pork and beef. Tofu. Eggs. Nuts. Beans. Dairy Low-fat or fat-free dairy products, such as yogurt, cottage cheese, and cheese. Beverages Water. Tea. Coffee. Sugar-free or diet soda. Seltzer water. Lowfat or no-fat milk. Milk alternatives,  such as soy or almond milk. Fats and oils Olive oil. Canola oil. Sunflower oil. Grapeseed oil. Avocado. Walnuts. Sweets and desserts Sugar-free or low-fat pudding. Sugar-free or low-fat ice cream and other frozen treats. Seasoning and other foods Herbs. Sodium-free  spices. Mustard. Relish. Low-fat, low-sugar ketchup. Low-fat, low-sugar barbecue sauce. Low-fat or fat-free mayonnaise. What foods are not recommended? The items listed below may not be a complete list. Talk with your dietitian about what dietary choices are best for you. Grains Refined white flour and flour products, such as bread, pasta, snack foods, and cereals. Vegetables Canned vegetables. Frozen vegetables with butter or cream sauce. Fruits Fruits canned with syrup. Meats and other protein foods Fatty cuts of meat. Poultry with skin. Breaded or fried meat. Processed meats. Dairy Full-fat yogurt, cheese, or milk. Beverages Sweetened drinks, such as sweet iced tea and soda. Fats and oils Butter. Lard. Ghee. Sweets and desserts Baked goods, such as cake, cupcakes, pastries, cookies, and cheesecake. Seasoning and other foods Spice mixes with added salt. Ketchup. Barbecue sauce. Mayonnaise. Summary  To prevent diabetes from developing, you may need to make diet and other lifestyle changes to help control blood sugar, improve cholesterol levels, and manage your blood pressure.  Set weight loss goals with the help of your health care team. It is recommended that most people with prediabetes lose 7 percent of their current body weight.  Consider following a Mediterranean diet that includes plenty of fresh fruits and vegetables, whole grains, beans, nuts, seeds, fish, lean meat, low-fat dairy, and healthy oils. This information is not intended to replace advice given to you by your health care provider. Make sure you discuss any questions you have with your health care provider. Document Released: 06/20/2014 Document Revised: 04/09/2016 Document Reviewed: 04/09/2016 Elsevier Interactive Patient Education  2019 [employer].

## 2018-04-15 ENCOUNTER — Encounter: Payer: Self-pay | Admitting: Physician Assistant

## 2018-04-20 ENCOUNTER — Encounter: Payer: Self-pay | Admitting: Physician Assistant

## 2018-04-20 DIAGNOSIS — F329 Major depressive disorder, single episode, unspecified: Secondary | ICD-10-CM | POA: Insufficient documentation

## 2018-04-20 DIAGNOSIS — F32A Depression, unspecified: Secondary | ICD-10-CM | POA: Insufficient documentation

## 2018-05-13 ENCOUNTER — Encounter: Payer: Self-pay | Admitting: Physician Assistant

## 2018-05-18 ENCOUNTER — Encounter: Payer: Self-pay | Admitting: Physician Assistant

## 2018-05-26 ENCOUNTER — Ambulatory Visit: Payer: 59 | Admitting: Physician Assistant

## 2018-06-01 ENCOUNTER — Telehealth (INDEPENDENT_AMBULATORY_CARE_PROVIDER_SITE_OTHER): Payer: 59 | Admitting: Physician Assistant

## 2018-06-01 ENCOUNTER — Other Ambulatory Visit: Payer: Self-pay

## 2018-06-01 ENCOUNTER — Telehealth: Payer: Self-pay

## 2018-06-01 ENCOUNTER — Encounter: Payer: Self-pay | Admitting: Physician Assistant

## 2018-06-01 VITALS — BP 126/85 | HR 83 | Wt 227.0 lb

## 2018-06-01 DIAGNOSIS — K219 Gastro-esophageal reflux disease without esophagitis: Secondary | ICD-10-CM

## 2018-06-01 DIAGNOSIS — I10 Essential (primary) hypertension: Secondary | ICD-10-CM

## 2018-06-01 DIAGNOSIS — Z7689 Persons encountering health services in other specified circumstances: Secondary | ICD-10-CM | POA: Diagnosis not present

## 2018-06-01 MED ORDER — DEXLANSOPRAZOLE 30 MG PO CPDR: 30.0000 mg | DELAYED_RELEASE_CAPSULE | Freq: Every day | ORAL | 1 refills | Status: DC

## 2018-06-01 MED ORDER — HYDROCHLOROTHIAZIDE 25 MG PO TABS: 25.0000 mg | ORAL_TABLET | Freq: Every day | ORAL | 1 refills | Status: DC

## 2018-06-01 MED ORDER — PHENTERMINE HCL 15 MG PO CAPS: 15.0000 mg | ORAL_CAPSULE | Freq: Every day | ORAL | 0 refills | Status: DC

## 2018-06-01 NOTE — Patient Instructions (Signed)
Carbohydrate Counting for Diabetes Mellitus, Adult  Carbohydrate counting is a method of keeping track of how many carbohydrates you eat. Eating carbohydrates naturally increases the amount of sugar (glucose) in the blood. Counting how many carbohydrates you eat helps keep your blood glucose within normal limits, which helps you manage your diabetes (diabetes mellitus). It is important to know how many carbohydrates you can safely have in each meal. This is different for every person. A diet and nutrition specialist (registered dietitian) can help you make a meal plan and calculate how many carbohydrates you should have at each meal and snack. Carbohydrates are found in the following foods:  Grains, such as breads and cereals.  Dried beans and soy products.  Starchy vegetables, such as potatoes, peas, and corn.  Fruit and fruit juices.  Milk and yogurt.  Sweets and snack foods, such as cake, cookies, candy, chips, and soft drinks. How do I count carbohydrates? There are two ways to count carbohydrates in food. You can use either of the methods or a combination of both. Reading "Nutrition Facts" on packaged food The "Nutrition Facts" list is included on the labels of almost all packaged foods and beverages in the [place] It includes:  The serving size.  Information about nutrients in each serving, including the grams (g) of carbohydrate per serving. To use the "Nutrition Facts":  Decide how many servings you will have.  Multiply the number of servings by the number of carbohydrates per serving.  The resulting number is the total amount of carbohydrates that you will be having. Learning standard serving sizes of other foods When you eat carbohydrate foods that are not packaged or do not include "Nutrition Facts" on the label, you need to measure the servings in order to count the amount of carbohydrates:  Measure the foods that you will eat with a food scale or measuring cup, if needed.   Decide how many standard-size servings you will eat.  Multiply the number of servings by 15. Most carbohydrate-rich foods have about 15 g of carbohydrates per serving. ? For example, if you eat 8 oz (170 g) of strawberries, you will have eaten 2 servings and 30 g of carbohydrates (2 servings x 15 g = 30 g).  For foods that have more than one food mixed, such as soups and casseroles, you must count the carbohydrates in each food that is included. The following list contains standard serving sizes of common carbohydrate-rich foods. Each of these servings has about 15 g of carbohydrates:   hamburger bun or  English muffin.   oz (15 mL) syrup.   oz (14 g) jelly.  1 slice of bread.  1 six-inch tortilla.  3 oz (85 g) cooked rice or pasta.  4 oz (113 g) cooked dried beans.  4 oz (113 g) starchy vegetable, such as peas, corn, or potatoes.  4 oz (113 g) hot cereal.  4 oz (113 g) mashed potatoes or  of a large baked potato.  4 oz (113 g) canned or frozen fruit.  4 oz (120 mL) fruit juice.  4-6 crackers.  6 chicken nuggets.  6 oz (170 g) unsweetened dry cereal.  6 oz (170 g) plain fat-free yogurt or yogurt sweetened with artificial sweeteners.  8 oz (240 mL) milk.  8 oz (170 g) fresh fruit or one small piece of fruit.  24 oz (680 g) popped popcorn. Example of carbohydrate counting Sample meal  3 oz (85 g) chicken breast.  6 oz (170 g)   brown rice.  4 oz (113 g) corn.  8 oz (240 mL) milk.  8 oz (170 g) strawberries with sugar-free whipped topping. Carbohydrate calculation 1. Identify the foods that contain carbohydrates: ? Rice. ? Corn. ? Milk. ? Strawberries. 2. Calculate how many servings you have of each food: ? 2 servings rice. ? 1 serving corn. ? 1 serving milk. ? 1 serving strawberries. 3. Multiply each number of servings by 15 g: ? 2 servings rice x 15 g = 30 g. ? 1 serving corn x 15 g = 15 g. ? 1 serving milk x 15 g = 15 g. ? 1 serving  strawberries x 15 g = 15 g. 4. Add together all of the amounts to find the total grams of carbohydrates eaten: ? 30 g + 15 g + 15 g + 15 g = 75 g of carbohydrates total. Summary  Carbohydrate counting is a method of keeping track of how many carbohydrates you eat.  Eating carbohydrates naturally increases the amount of sugar (glucose) in the blood.  Counting how many carbohydrates you eat helps keep your blood glucose within normal limits, which helps you manage your diabetes.  A diet and nutrition specialist (registered dietitian) can help you make a meal plan and calculate how many carbohydrates you should have at each meal and snack. This information is not intended to replace advice given to you by your health care provider. Make sure you discuss any questions you have with your health care provider. Document Released: 02/03/2005 Document Revised: 08/13/2016 Document Reviewed: 07/18/2015 Elsevier Interactive Patient Education  2019 Elsevier Inc[employer]  

## 2018-06-01 NOTE — Progress Notes (Signed)
Virtual Visit via Video Note  I connected with Megan [name] on 06/01/18 at  8:10 AM EDT by a video enabled telemedicine application and verified that I am speaking with the correct person using two identifiers.   I discussed the limitations of evaluation and management by telemedicine and the availability of in person appointments. The patient expressed understanding and agreed to proceed.  History of Present Illness:  HTN: taking HCTZ 25 mg daily. Compliant with medications. Checks BP's at home. BP range 120's-130's/80's. Denies vision change, headache, chest pain with exertion, orthopnea, lightheadedness, syncope and edema. Risk factors include: obesity, post-menopausal   Obesity/Prediabetes: reports she ran out of Phentermine about 1 week ago. She states weight was down to 219 lb, but she has gained about 8 pounds over several weeks. She attributes this to increased stress from COVID-19 pandemic and emotional eating. She would like to re-start Phentermine to prevent further weight gain. Denies chest pain, palpitations or irregular heart beats with Phentermine. Wt Readings from Last 3 Encounters:  06/01/18 227 lb (103 kg)  04/14/18 224 lb (101.6 kg)  03/02/18 226 lb (102.5 kg)   GERD: was having breakthrough symptoms on Pantoprazole and Omeprazole. Switched to [employer] and is having no symptoms. Denies dysphagia, odynophagia, forceful vomiting, melena/hematochezia.     Past Medical History:  Diagnosis Date  . Acute respiratory failure ([place])   . GERD (gastroesophageal reflux disease)   . Hypertension   . Menopausal vasomotor syndrome   . Obesity   . Pre-diabetes   . RSV (respiratory syncytial virus pneumonia)    Past Surgical History:  Procedure Laterality Date  . ANKLE ARTHROSCOPY Left 1985  . DILATION AND CURETTAGE OF UTERUS N/A 08/14/2017   Procedure: DILATATION AND CURETTAGE;  Surgeon: Emily Filbert, MD;  Location: [place];  Service: Gynecology;   Laterality: N/A;   Social History   Tobacco Use  . Smoking status: Never Smoker  . Smokeless tobacco: Never Used  Substance Use Topics  . Alcohol use: Never    Frequency: Never   family history includes Breast cancer in her maternal grandmother; Heart disease in her father and mother; Hypertension in her mother.    ROS: negative except as noted in the HPI  Medications: Current Outpatient Medications  Medication Sig Dispense Refill  . aspirin EC 81 MG tablet Take 1 tablet (81 mg total) by mouth daily. 90 tablet 3  . Dexlansoprazole 30 MG capsule Take 1 capsule (30 mg total) by mouth daily. 90 capsule 1  . hydrochlorothiazide (HYDRODIURIL) 25 MG tablet Take 1 tablet (25 mg total) by mouth daily. 90 tablet 1  . sertraline (ZOLOFT) 50 MG tablet TAKE 1 TABLET(50 MG) BY MOUTH AT BEDTIME 90 tablet 1  . phentermine 15 MG capsule Take 1 capsule (15 mg total) by mouth daily with lunch. (Patient not taking: Reported on 06/01/2018) 30 capsule 0   No current facility-administered medications for this visit.    No Known Allergies     Objective:  BP 126/85   Pulse 83   Wt 227 lb (103 kg)   BMI 38.96 kg/m  Gen:  alert, not ill-appearing, no distress, appropriate for age 89: head normocephalic without obvious abnormality, conjunctiva and cornea clear, wearing glasses, trachea midline Pulm: Normal work of breathing, normal phonation, speaking in full sentences Neuro: alert and oriented x 3 Psych: cooperative, euthymic mood, affect mood-congruent, speech is articulate, normal rate and volume; thought processes clear and goal-directed, normal judgment, good insight   Lab Results  Component Value Date   CREATININE 0.86 09/07/2017   BUN 9 09/07/2017   NA 140 09/07/2017   K 4.3 09/07/2017   CL 99 09/07/2017   CO2 34 (H) 09/07/2017   Lab Results  Component Value Date   ALT 19 09/07/2017   AST 22 09/07/2017   BILITOT 0.7 09/07/2017   Lab Results  Component Value Date   HGBA1C  5.8 04/14/2018    Lab Results  Component Value Date   CHOL 211 (H) 07/07/2017   HDL 71 07/07/2017   LDLCALC 106 (H) 07/07/2017   TRIG 217 (H) 07/07/2017   CHOLHDL 3.0 07/07/2017   The 10-year ASCVD risk score Mikey Bussing DC Jr., et al., 2013) is: 3%   Values used to calculate the score:     Age: 58 years     Sex: Female     Is Non-Hispanic African American: No     Diabetic: No     Tobacco smoker: No     Systolic Blood Pressure: 335 mmHg     Is BP treated: Yes     HDL Cholesterol: 71 mg/dL     Total Cholesterol: 211 mg/dL    Assessment and Plan: 58 y.o. female with   .[name] was seen today for medication management.  Diagnoses and all orders for this visit:  Encounter for weight management  Gastroesophageal reflux disease without esophagitis -     Dexlansoprazole 30 MG capsule; Take 1 capsule (30 mg total) by mouth daily.  Essential hypertension -     hydrochlorothiazide (HYDRODIURIL) 25 MG tablet; Take 1 tablet (25 mg total) by mouth daily.  Class 2 severe obesity due to excess calories with serious comorbidity in adult, unspecified BMI (HCC) -     phentermine 15 MG capsule; Take 1 capsule (15 mg total) by mouth daily with lunch.   Vitals reviewed. BP with mild diastolic elevation of 85 Counseled on weight loss through behavior modification using lifestyle tools such as the [employer] app to supplement calorie restricted diet and aerobic exercise Will refill Phentermine next month only if there is demonstrated weight loss at next weight check Will consider adding Metformin, Topamax or Wellbutrin for additional appetite suppression Follow-up in 4 weeks for weight/BP check  Due for routine fasting labs in 3 months. Instructed to schedule CPE    Follow Up Instructions:    I discussed the assessment and treatment plan with the patient. The patient was provided an opportunity to ask questions and all were answered. The patient agreed with the plan and demonstrated an  understanding of the instructions.   The patient was advised to call back or seek an in-person evaluation if the symptoms worsen or if the condition fails to improve as anticipated.  I provided 15 minutes of non-face-to-face time during this encounter.   Trixie Dredge, [place]

## 2018-06-01 NOTE — Telephone Encounter (Signed)
Covermymeds PA for Phentermine 15 mg tablets. Key [id number]

## 2018-06-03 NOTE — Telephone Encounter (Signed)
Insurance denied the medication and patient was able to pick the medication up for little cost. Pharmacy notified that PA was denied.

## 2018-07-02 ENCOUNTER — Other Ambulatory Visit: Payer: Self-pay | Admitting: Physician Assistant

## 2018-07-02 ENCOUNTER — Ambulatory Visit (INDEPENDENT_AMBULATORY_CARE_PROVIDER_SITE_OTHER): Payer: 59 | Admitting: Physician Assistant

## 2018-07-02 ENCOUNTER — Encounter: Payer: Self-pay | Admitting: Physician Assistant

## 2018-07-02 VITALS — BP 119/77 | HR 98 | Temp 97.7°F | Wt 221.0 lb

## 2018-07-02 DIAGNOSIS — F419 Anxiety disorder, unspecified: Secondary | ICD-10-CM

## 2018-07-02 DIAGNOSIS — F5105 Insomnia due to other mental disorder: Secondary | ICD-10-CM | POA: Diagnosis not present

## 2018-07-02 DIAGNOSIS — Z7689 Persons encountering health services in other specified circumstances: Secondary | ICD-10-CM | POA: Diagnosis not present

## 2018-07-02 MED ORDER — HYDROXYZINE HCL 25 MG PO TABS: 25.0000 mg | ORAL_TABLET | Freq: Every evening | ORAL | 0 refills | Status: DC | PRN

## 2018-07-02 MED ORDER — PHENTERMINE HCL 15 MG PO CAPS: 15.0000 mg | ORAL_CAPSULE | ORAL | 0 refills | Status: DC

## 2018-07-02 NOTE — Progress Notes (Signed)
Virtual Visit via Telephone Note  I connected with Megan [name] on 07/02/18 at  9:10 AM EDT by telephone and verified that I am speaking with the correct person using two identifiers.   I discussed the limitations, risks, security and privacy concerns of performing an evaluation and management service by telephone and the availability of in person appointments. I also discussed with the patient that there may be a patient responsible charge related to this service. The patient expressed understanding and agreed to proceed.   History of Present Illness: HPI:                                                                Megan [name] is a 58 y.o. female   CC: weight management  Megan [name] has lost 6 pounds over 4 weeks She has reduced her portion sizes She is skipping breakfast, eats lunch and small dinner/snack Reports she has been more physically active, gardening outside She is taking Phentermine at 7 am. Denies any adverse effects including palpitations, irregular heart beat, chest pain. She is drinking caffeine-free diet dr. Malachi Bonds  She has noticed difficulty falling asleep and Melatonin is no longer working for her. She describes this as "my brain won't shut down." She is getting less than 6 hours of sleep nightly and feels tired.  Depression screen [facility] 2/9 07/02/2018 04/14/2018 10/07/2017 07/06/2017 06/15/2017  Decreased Interest 0 0 0 0 3  Down, Depressed, Hopeless 0 0 0 0 3  PHQ - 2 Score 0 0 0 0 6  Altered sleeping 1 - - 0 1  Tired, decreased energy 0 - - 0 3  Change in appetite 0 - - 0 3  Feeling bad or failure about yourself  0 - - 0 3  Trouble concentrating 0 - - 0 3  Moving slowly or fidgety/restless 0 - - 0 1  Suicidal thoughts 0 - - 0 3  PHQ-9 Score 1 - - 0 23  Difficult doing work/chores - - - Not difficult at all Extremely dIfficult    GAD 7 : Generalized Anxiety Score 07/02/2018 10/07/2017 07/06/2017 06/15/2017  Nervous, Anxious, on Edge 0 0 0 3  Control/stop worrying 0 0  0 3  Worry too much - different things 1 0 0 3  Trouble relaxing 0 0 0 3  Restless 0 0 0 3  Easily annoyed or irritable 0 0 0 3  Afraid - awful might happen 0 0 0 3  Total GAD 7 Score 1 0 0 21  Anxiety Difficulty - - Not difficult at all Extremely difficult      Past Medical History:  Diagnosis Date  . Acute respiratory failure ([place])   . GERD (gastroesophageal reflux disease)   . Hypertension   . Menopausal vasomotor syndrome   . Obesity   . Pre-diabetes   . RSV (respiratory syncytial virus pneumonia)    Past Surgical History:  Procedure Laterality Date  . ANKLE ARTHROSCOPY Left 1985  . DILATION AND CURETTAGE OF UTERUS N/A 08/14/2017   Procedure: DILATATION AND CURETTAGE;  Surgeon: Emily Filbert, MD;  Location: [place];  Service: Gynecology;  Laterality: N/A;   Social History   Tobacco Use  . Smoking status: Never Smoker  . Smokeless tobacco: Never Used  Substance  Use Topics  . Alcohol use: Never    Frequency: Never   family history includes Breast cancer in her maternal grandmother; Heart disease in her father and mother; Hypertension in her mother.    ROS: negative except as noted in the HPI  Medications: Current Outpatient Medications  Medication Sig Dispense Refill  . aspirin EC 81 MG tablet Take 1 tablet (81 mg total) by mouth daily. 90 tablet 3  . Dexlansoprazole 30 MG capsule Take 1 capsule (30 mg total) by mouth daily. 90 capsule 1  . hydrochlorothiazide (HYDRODIURIL) 25 MG tablet Take 1 tablet (25 mg total) by mouth daily. 90 tablet 1  . phentermine 15 MG capsule Take 1 capsule (15 mg total) by mouth every morning. 30 capsule 0  . sertraline (ZOLOFT) 50 MG tablet TAKE 1 TABLET(50 MG) BY MOUTH AT BEDTIME 90 tablet 1  . hydrOXYzine (ATARAX/VISTARIL) 25 MG tablet Take 1-2 tablets (25-50 mg total) by mouth at bedtime as needed for anxiety (insomnia). 60 tablet 0   No current facility-administered medications for this visit.    No Known  Allergies     Objective:  BP 119/77   Pulse 98   Temp 97.7 F (36.5 C) (Oral)   Wt 221 lb (100.2 kg)   BMI 37.93 kg/m  Vitals:   07/02/18 0915 07/02/18 0916  BP: 124/82 119/77  Pulse: 98   Temp:      Wt Readings from Last 3 Encounters:  07/02/18 221 lb (100.2 kg)  06/01/18 227 lb (103 kg)  04/14/18 224 lb (101.6 kg)   BP Readings from Last 3 Encounters:  07/02/18 119/77  06/01/18 126/85  04/14/18 131/85   Pulse Readings from Last 3 Encounters:  07/02/18 98  06/01/18 83  04/14/18 88     No results found for this or any previous visit (from the past 72 hour(s)). No results found.    Assessment and Plan: 58 y.o. female with   ..Diagnoses and all orders for this visit:  Encounter for weight management  Insomnia secondary to anxiety -     hydrOXYzine (ATARAX/VISTARIL) 25 MG tablet; Take 1-2 tablets (25-50 mg total) by mouth at bedtime as needed for anxiety (insomnia).  Class 2 severe obesity due to excess calories with serious comorbidity in adult, unspecified BMI (HCC) -     phentermine 15 MG capsule; Take 1 capsule (15 mg total) by mouth every morning.   Vitals reviewed BP initially elevated. Improved on recheck Heart rate is high normal, no tachycardia Cont Phentermine QAM  Adding Hydroxyzine for insomnia/anxiety Phentermine may be contributory, but unlikely given that she is taking this 12 hours before bedtime Cont Sertraline  Follow-up in 1 month   Follow Up Instructions:    I discussed the assessment and treatment plan with the patient. The patient was provided an opportunity to ask questions and all were answered. The patient agreed with the plan and demonstrated an understanding of the instructions.   The patient was advised to call back or seek an in-person evaluation if the symptoms worsen or if the condition fails to improve as anticipated.  I provided 11-20 minutes of non-face-to-face time during this encounter.   Trixie Dredge, [place]

## 2018-08-02 ENCOUNTER — Encounter: Payer: Self-pay | Admitting: Physician Assistant

## 2018-08-02 ENCOUNTER — Ambulatory Visit (INDEPENDENT_AMBULATORY_CARE_PROVIDER_SITE_OTHER): Payer: 59 | Admitting: Physician Assistant

## 2018-08-02 VITALS — BP 122/74 | HR 100 | Wt 219.0 lb

## 2018-08-02 DIAGNOSIS — Z7689 Persons encountering health services in other specified circumstances: Secondary | ICD-10-CM | POA: Diagnosis not present

## 2018-08-02 MED ORDER — PHENTERMINE HCL 15 MG PO CAPS: 15.0000 mg | ORAL_CAPSULE | ORAL | 0 refills | Status: DC

## 2018-08-02 NOTE — Progress Notes (Signed)
Virtual Visit via Video Note  I connected with Megan [name] on 08/02/18 at  4:00 PM EDT by a video enabled telemedicine application and verified that I am speaking with the correct person using two identifiers.   I discussed the limitations of evaluation and management by telemedicine and the availability of in person appointments. The patient expressed understanding and agreed to proceed.  History of Present Illness: HPI:                                                                Megan [name] is a 57 y.o. female   CC: weight management  Megan [name] has lost an additional 2 pounds over the last month, total of 6 pounds over 8 weeks  Reports she is an emotional eater and has found herself eating when she is stressed and when she is happy She is skipping breakfast and dinner; eats lunch, frequently snacks on fruit Reports she has been more physically active, gardening outside She is drinking caffeine-free Diet Dr. Malachi Bonds Denies headache, sleep disturbance, mood changes. Denies chest pain, palpitations, irregular heart beat.   She has noticed difficulty falling asleep and Melatonin is no longer working for her. She describes this as "my brain won't shut down."  She tried taking 12.5-25 mg of Hydroxyzine and states it caused her to sleep the following day and to feel very groggy. She feels like sleep has improved slightly since her last visit.  Past Medical History:  Diagnosis Date  . Acute respiratory failure ([place])   . GERD (gastroesophageal reflux disease)   . Hypertension   . Menopausal vasomotor syndrome   . Obesity   . Pre-diabetes   . RSV (respiratory syncytial virus pneumonia)    Past Surgical History:  Procedure Laterality Date  . ANKLE ARTHROSCOPY Left 1985  . DILATION AND CURETTAGE OF UTERUS N/A 08/14/2017   Procedure: DILATATION AND CURETTAGE;  Surgeon: Emily Filbert, MD;  Location: [facility];  Service: Gynecology;  Laterality: N/A;   Social History    Tobacco Use  . Smoking status: Never Smoker  . Smokeless tobacco: Never Used  Substance Use Topics  . Alcohol use: Never    Frequency: Never   family history includes Breast cancer in her maternal grandmother; Heart disease in her father and mother; Hypertension in her mother.    ROS: negative except as noted in the HPI  Medications: Current Outpatient Medications  Medication Sig Dispense Refill  . aspirin EC 81 MG tablet Take 1 tablet (81 mg total) by mouth daily. 90 tablet 3  . Dexlansoprazole 30 MG capsule Take 1 capsule (30 mg total) by mouth daily. 90 capsule 1  . hydrochlorothiazide (HYDRODIURIL) 25 MG tablet Take 1 tablet (25 mg total) by mouth daily. 90 tablet 1  . phentermine 15 MG capsule Take 1 capsule (15 mg total) by mouth every morning. 30 capsule 0  . sertraline (ZOLOFT) 50 MG tablet TAKE 1 TABLET(50 MG) BY MOUTH AT BEDTIME 90 tablet 1  . hydrOXYzine (ATARAX/VISTARIL) 25 MG tablet Take 1-2 tablets (25-50 mg total) by mouth at bedtime as needed for anxiety (insomnia). (Patient not taking: Reported on 08/02/2018) 60 tablet 0   No current facility-administered medications for this visit.    No Known Allergies  Objective:  BP 122/74   Pulse 100   Wt 219 lb (99.3 kg)   BMI 37.59 kg/m  Gen:  alert, not ill-appearing, no distress, appropriate for age 66: head normocephalic without obvious abnormality, conjunctiva and cornea clear, wearing glasses, trachea midline Pulm: Normal work of breathing, normal phonation Neuro: alert and oriented x 3 Psych: cooperative, euthymic mood, affect mood-congruent, speech is articulate, normal rate and volume; thought processes clear and goal-directed, normal judgment, good insight   BP Readings from Last 3 Encounters:  08/02/18 122/74  07/02/18 119/77  06/01/18 126/85   Pulse Readings from Last 3 Encounters:  08/02/18 100  07/02/18 98  06/01/18 83   Wt Readings from Last 3 Encounters:  08/02/18 219 lb (99.3 kg)   07/02/18 221 lb (100.2 kg)  06/01/18 227 lb (103 kg)     No results found for this or any previous visit (from the past 36 hour(s)). No results found.    Assessment and Plan: 58 y.o. female with   .Megan [name] was seen today for medication management.  Diagnoses and all orders for this visit:  Class 2 severe obesity due to excess calories with serious comorbidity in adult, unspecified BMI ([place]) -     phentermine 15 MG capsule; Take 1 capsule (15 mg total) by mouth every morning.    Patient provided vital signs reviewed Mildly tachycardic at 100 BP normotensive Weight down 2 lb over 1 month, 8 lb total over 8 weeks We discussed SMART goals and coping strategies to combat emotional eating Exercise - plans to exercise outside with the dog for 10 mins, 7 days per week for the next month Encouraged her to add a protein to her evening snack of fruit  Follow-up in 1 month for weight/BP check or sooner as needed if symptoms worsen or fail to improve    Follow Up Instructions:    I discussed the assessment and treatment plan with the patient. The patient was provided an opportunity to ask questions and all were answered. The patient agreed with the plan and demonstrated an understanding of the instructions.   The patient was advised to call back or seek an in-person evaluation if the symptoms worsen or if the condition fails to improve as anticipated.  I provided 10 minutes of non-face-to-face time during this encounter.   Trixie Dredge, [place]

## 2018-08-16 ENCOUNTER — Other Ambulatory Visit: Payer: Self-pay

## 2018-08-16 DIAGNOSIS — F325 Major depressive disorder, single episode, in full remission: Secondary | ICD-10-CM

## 2018-08-16 MED ORDER — SERTRALINE HCL 50 MG PO TABS: ORAL_TABLET | ORAL | 0 refills | Status: DC

## 2018-08-30 ENCOUNTER — Encounter: Payer: Self-pay | Admitting: Physician Assistant

## 2018-08-31 ENCOUNTER — Telehealth (INDEPENDENT_AMBULATORY_CARE_PROVIDER_SITE_OTHER): Payer: 59 | Admitting: Physician Assistant

## 2018-08-31 ENCOUNTER — Encounter: Payer: Self-pay | Admitting: Physician Assistant

## 2018-08-31 MED ORDER — METFORMIN HCL 500 MG PO TABS: ORAL_TABLET | ORAL | 0 refills | Status: DC

## 2018-08-31 MED ORDER — PHENTERMINE HCL 8 MG PO TABS: 8.0000 mg | ORAL_TABLET | Freq: Every day | ORAL | 0 refills | Status: DC

## 2018-08-31 NOTE — Progress Notes (Signed)
Virtual Visit via Video Note  I connected with Megan [name] on 08/31/18 at  3:00 PM EDT by a video enabled telemedicine application and verified that I am speaking with the correct person using two identifiers.   I discussed the limitations of evaluation and management by telemedicine and the availability of in person appointments. The patient expressed understanding and agreed to proceed.  History of Present Illness: HPI:                                                                Megan [name] is a 58 y.o. female   CC: weight management  Wt Readings from Last 3 Encounters:  08/31/18 224 lb (101.6 kg)  08/02/18 219 lb (99.3 kg)  07/02/18 221 lb (100.2 kg)     Interval hx 08/31/18 - reports she no longer feels any appetite suppression from the Phentermine. Denies any mood changes or sleep disturbance. Denies chest pain, palpitations or irregluar beats - she recently went on vacation and was eating larger meals and eating out more often - she is not measuring her food or adhering to any particular eating plan  08/02/18 Starting weight: 226 lb Megan [name] has lost an additional 2 pounds over the last month, total of 6 pounds over 8 weeks  Reports she is an emotional eater and has found herself eating when she is stressed and when she is happy She is skipping breakfast and dinner; eats lunch, frequently snacks on fruit Reports she has been more physically active, gardening outside She is drinking caffeine-free Diet Dr. Malachi Bonds. She has eliminated sweet tea Denies headache, sleep disturbance, mood changes. Denies chest pain, palpitations, irregular heart beat.    Past Medical History:  Diagnosis Date  . Acute respiratory failure ([place])   . GERD (gastroesophageal reflux disease)   . Hypertension   . Menopausal vasomotor syndrome   . Obesity   . Pre-diabetes   . RSV (respiratory syncytial virus pneumonia)    Past Surgical History:  Procedure Laterality Date  . ANKLE ARTHROSCOPY  Left 1985  . DILATION AND CURETTAGE OF UTERUS N/A 08/14/2017   Procedure: DILATATION AND CURETTAGE;  Surgeon: Emily Filbert, MD;  Location: [facility];  Service: Gynecology;  Laterality: N/A;   Social History   Tobacco Use  . Smoking status: Never Smoker  . Smokeless tobacco: Never Used  Substance Use Topics  . Alcohol use: Never    Frequency: Never   family history includes Breast cancer in her maternal grandmother; Heart disease in her father and mother; Hypertension in her mother.    ROS: negative except as noted in the HPI  Medications: Current Outpatient Medications  Medication Sig Dispense Refill  . aspirin EC 81 MG tablet Take 1 tablet (81 mg total) by mouth daily. 90 tablet 3  . Dexlansoprazole 30 MG capsule Take 1 capsule (30 mg total) by mouth daily. 90 capsule 1  . hydrochlorothiazide (HYDRODIURIL) 25 MG tablet Take 1 tablet (25 mg total) by mouth daily. 90 tablet 1  . phentermine 15 MG capsule Take 1 capsule (15 mg total) by mouth every morning. 30 capsule 0  . sertraline (ZOLOFT) 50 MG tablet Due for follow up visit w/PCP 30 tablet 0   No current facility-administered medications for this visit.  Allergies  Allergen Reactions  . Hydroxyzine Hcl Other (See Comments)    hypersomnolence       Objective:  BP 129/81   Pulse 95   Temp (!) 96.1 F (35.6 C) (Oral)   Wt 224 lb (101.6 kg)   BMI 38.45 kg/m  Gen:  alert, not ill-appearing, no distress, appropriate for age HEENT: head normocephalic without obvious abnormality, conjunctiva and cornea clear, wearing glasses Pulm: Normal work of breathing, normal phonation Neuro: alert and oriented x 3 Psych: cooperative, euthymic mood, affect mood-congruent, speech is articulate, normal rate and volume; thought processes clear and goal-directed, normal judgment, good insight   BP Readings from Last 3 Encounters:  08/31/18 129/81  08/02/18 122/74  07/02/18 119/77   Pulse Readings from Last 3  Encounters:  08/31/18 95  08/02/18 100  07/02/18 98   Wt Readings from Last 3 Encounters:  08/31/18 224 lb (101.6 kg)  08/02/18 219 lb (99.3 kg)  07/02/18 221 lb (100.2 kg)     No results found for this or any previous visit (from the past 72 hour(s)). No results found.    Assessment and Plan: 58 y.o. female with   .Caelie was seen today for weight check.  Diagnoses and all orders for this visit:  Class 2 severe obesity due to excess calories with serious comorbidity in adult, unspecified BMI ([place])    Patient provided vital signs reviewed Weight increased 5 lb over 1 month. Since starting Phentermine 15 mg 6 months ago her weight is essentially unchanged Tapering off of Phentermine. Reduce to 8 mg QD for 1 month Starting Metformin - plan to titrate to 1-2.5g per day as tolerated Counseled patient on 1400-1600 calorie eating plan Encouraged increased aerobic activity   Follow-up in 2 months for weight/BP check or sooner as needed if symptoms worsen or fail to improve    Follow Up Instructions:    I discussed the assessment and treatment plan with the patient. The patient was provided an opportunity to ask questions and all were answered. The patient agreed with the plan and demonstrated an understanding of the instructions.   The patient was advised to call back or seek an in-person evaluation if the symptoms worsen or if the condition fails to improve as anticipated.  I provided 10 minutes of non-face-to-face time during this encounter.   Trixie Dredge, [place]

## 2018-08-31 NOTE — Telephone Encounter (Signed)
Patient schedule

## 2018-09-03 ENCOUNTER — Telehealth: Payer: Self-pay | Admitting: Physician Assistant

## 2018-09-03 DIAGNOSIS — K219 Gastro-esophageal reflux disease without esophagitis: Secondary | ICD-10-CM

## 2018-09-03 DIAGNOSIS — K449 Diaphragmatic hernia without obstruction or gangrene: Secondary | ICD-10-CM

## 2018-09-03 MED ORDER — LANSOPRAZOLE 30 MG PO CPDR: 30.0000 mg | DELAYED_RELEASE_CAPSULE | Freq: Every day | ORAL | 3 refills | Status: DC

## 2018-09-03 NOTE — Telephone Encounter (Signed)
Insurance denied Dexilant 30 mg New Rx sent for [employer], please contact insurance to appeal. Patient has failed Pantoprazole

## 2018-09-06 NOTE — Telephone Encounter (Signed)
Drug is covered by current benefit plan. No further PA activity needed per insurance. Pharmacy aware.

## 2018-09-13 ENCOUNTER — Other Ambulatory Visit: Payer: Self-pay | Admitting: Physician Assistant

## 2018-09-13 DIAGNOSIS — F325 Major depressive disorder, single episode, in full remission: Secondary | ICD-10-CM

## 2018-09-15 ENCOUNTER — Encounter: Payer: 59 | Admitting: Physician Assistant

## 2018-09-16 ENCOUNTER — Other Ambulatory Visit: Payer: Self-pay

## 2018-09-16 ENCOUNTER — Encounter: Payer: Self-pay | Admitting: Physician Assistant

## 2018-09-16 ENCOUNTER — Ambulatory Visit (INDEPENDENT_AMBULATORY_CARE_PROVIDER_SITE_OTHER): Payer: 59 | Admitting: Physician Assistant

## 2018-09-16 VITALS — BP 126/84 | HR 86 | Temp 98.2°F | Ht 64.0 in | Wt 222.0 lb

## 2018-09-16 DIAGNOSIS — Z0000 Encounter for general adult medical examination without abnormal findings: Secondary | ICD-10-CM | POA: Diagnosis not present

## 2018-09-16 DIAGNOSIS — E785 Hyperlipidemia, unspecified: Secondary | ICD-10-CM

## 2018-09-16 DIAGNOSIS — R7303 Prediabetes: Secondary | ICD-10-CM

## 2018-09-16 DIAGNOSIS — Z23 Encounter for immunization: Secondary | ICD-10-CM | POA: Diagnosis not present

## 2018-09-16 DIAGNOSIS — Z130 Encounter for screening for diseases of the blood and blood-forming organs and certain disorders involving the immune mechanism: Secondary | ICD-10-CM | POA: Diagnosis not present

## 2018-09-16 DIAGNOSIS — Z1389 Encounter for screening for other disorder: Secondary | ICD-10-CM

## 2018-09-16 NOTE — Patient Instructions (Signed)

## 2018-09-16 NOTE — Progress Notes (Signed)
HPI:                                                                Megan [name] is a 58 y.o. female who presents to [facility] today for annual physical exam  Current Concerns include:  Has noticed increased fatigue and loose bowel movements since starting Metformin.    Depression screen [facility] 2/9 09/16/2018 07/02/2018 04/14/2018 10/07/2017 07/06/2017  Decreased Interest 0 0 0 0 0  Down, Depressed, Hopeless 0 0 0 0 0  PHQ - 2 Score 0 0 0 0 0  Altered sleeping 0 1 - - 0  Tired, decreased energy 0 0 - - 0  Change in appetite 0 0 - - 0  Feeling bad or failure about yourself  0 0 - - 0  Trouble concentrating 0 0 - - 0  Moving slowly or fidgety/restless 0 0 - - 0  Suicidal thoughts - 0 - - 0  PHQ-9 Score 0 1 - - 0  Difficult doing work/chores Not difficult at all - - - Not difficult at all   Fall Risk  09/16/2018  Falls in the past year? 0     Health Maintenance Health Maintenance  Topic Date Due  . TETANUS/TDAP  10/08/2018 (Originally 12/29/2016)  . INFLUENZA VACCINE  09/18/2018  . COLONOSCOPY  01/17/2020  . MAMMOGRAM  02/20/2020  . PAP SMEAR-Modifier  07/28/2022  . Hepatitis C Screening  Completed  . HIV Screening  Discontinued    Past Medical History:  Diagnosis Date  . Acute respiratory failure ([place])   . GERD (gastroesophageal reflux disease)   . Hypertension   . Menopausal vasomotor syndrome   . Obesity   . Pre-diabetes   . RSV (respiratory syncytial virus pneumonia)    Past Surgical History:  Procedure Laterality Date  . ANKLE ARTHROSCOPY Left 1985  . DILATION AND CURETTAGE OF UTERUS N/A 08/14/2017   Procedure: DILATATION AND CURETTAGE;  Surgeon: Emily Filbert, MD;  Location: [facility];  Service: Gynecology;  Laterality: N/A;   Social History   Tobacco Use  . Smoking status: Never Smoker  . Smokeless tobacco: Never Used  Substance Use Topics  . Alcohol use: Never    Frequency: Never    family history includes Breast cancer in her maternal grandmother; Heart disease in her father and mother; Hypertension in her mother.  ROS: negative except as noted in the HPI  Medications: Current Outpatient Medications  Medication Sig Dispense Refill  . aspirin EC 81 MG tablet Take 1 tablet (81 mg total) by mouth daily. 90 tablet 3  . Dexlansoprazole 30 MG capsule Take 1 capsule (30 mg total) by mouth daily. 90 capsule 1  . hydrochlorothiazide (HYDRODIURIL) 25 MG tablet Take 1 tablet (25 mg total) by mouth daily. 90 tablet 1  . lansoprazole (PREVACID) 30 MG capsule Take 1 capsule (30 mg total) by mouth daily at 12 noon. 30 capsule 3  . metFORMIN (GLUCOPHAGE) 500 MG tablet Take 1 tablet (500 mg total) by mouth daily with supper for 7 days, THEN 1 tablet (500 mg total) 2 (two) times daily with a meal for 14 days, THEN 1 tablet (500 mg total) 3 (three) times daily with meals for 14 days.  90 tablet 0  . Phentermine HCl 8 MG TABS Take 8 mg by mouth daily. 28 tablet 0  . sertraline (ZOLOFT) 50 MG tablet TAKE 1 TABLET BY MOUTH AT BEDTIME 30 tablet 0   No current facility-administered medications for this visit.    Allergies  Allergen Reactions  . Hydroxyzine Hcl Other (See Comments)    hypersomnolence       Objective:  BP (!) 137/99   Pulse 86   Temp 98.2 F (36.8 C) (Oral)   Ht 5' 4"  (1.626 m)   Wt 222 lb (100.7 kg)   BMI 38.11 kg/m  Vitals:   09/16/18 0828 09/16/18 0846  BP: (!) 137/99 126/84  Pulse: 86   Temp: 98.2 F (36.8 C)    Wt Readings from Last 3 Encounters:  09/16/18 222 lb (100.7 kg)  08/31/18 224 lb (101.6 kg)  08/02/18 219 lb (99.3 kg)   Temp Readings from Last 3 Encounters:  09/16/18 98.2 F (36.8 C) (Oral)  08/31/18 (!) 96.1 F (35.6 C) (Oral)  07/02/18 97.7 F (36.5 C) (Oral)   BP Readings from Last 3 Encounters:  09/16/18 126/84  08/31/18 129/81  08/02/18 122/74   Pulse Readings from Last 3 Encounters:  09/16/18 86  08/31/18 95   08/02/18 100    General Appearance:  Alert, cooperative, no distress, appropriate for age, obese female                            Head:  Normocephalic, without obvious abnormality                             Eyes:  PERRL, EOM's intact, conjunctiva and cornea clear, wearing glasses                             Ears:  TM pearly gray color and semitransparent, external ear canals normal, both ears                                                  Neck:  Supple; symmetrical, trachea midline, no adenopathy; thyroid: no enlargement, symmetric, no tenderness/mass/nodules                             Back:  Symmetrical, no curvature, ROM normal               Chest/Breast:  deferred                           Lungs:  Clear to auscultation bilaterally, respirations unlabored                             Heart:  normal rate & regular rhythm, S1 and S2 normal, no murmurs, rubs, or gallops                     Abdomen:  Soft, non-tender, no mass or organomegaly              Genitourinary:  deferred         Musculoskeletal:  Tone and strength strong  and symmetrical, all extremities; no joint pain or edema, normal gait and station                                  Lymphatic:  No adenopathy             Skin/Hair/Nails:  Skin warm, dry and intact, no rashes or abnormal dyspigmentation on limited exam                   Neurologic:  Alert and oriented x3, no cranial nerve deficits, DTR's intact, sensation grossly intact, normal gait and station, no tremor Psych: well-groomed, cooperative, good eye contact, euthymic mood, affect mood-congruent, speech is articulate, and thought processes clear and goal-directed     No results found for this or any previous visit (from the past 72 hour(s)). No results found.    Assessment and Plan: 58 y.o. female with   .Megan [name] was seen today for annual exam.  Diagnoses and all orders for this visit:  Encounter for annual physical exam -     CBC -     COMPLETE METABOLIC  PANEL WITH GFR -     Lipid Panel w/reflex Direct LDL -     Urinalysis, Routine w reflex microscopic  Prediabetes -     COMPLETE METABOLIC PANEL WITH GFR  Hyperlipidemia, unspecified hyperlipidemia type -     Lipid Panel w/reflex Direct LDL  Screening for blood disease -     CBC -     COMPLETE METABOLIC PANEL WITH GFR  Screening for blood or protein in urine -     Urinalysis, Routine w reflex microscopic  Need for Tdap vaccination -     Tdap vaccine greater than or equal to 7yo IM   - Personally reviewed PMH, PSH, PFH, medications, allergies, HM - Age-appropriate cancer screening: UTD - Tdap given today - PHQ2 negative - Fall screen negative - Routine fasting labs pending - HTN - diastolic BP in stage 1 hypertensive range, cont current medications - BMI 38 - counseled on weight loss through decreased caloric intake and increased aerobic exercise. Cont metformin    Patient education and anticipatory guidance given Patient agrees with treatment plan Follow-up in 2 months for weight management, 1 year for  CPE or sooner as needed  Darlyne Russian PA-C

## 2018-09-17 LAB — LIPID PANEL W/REFLEX DIRECT LDL
Cholesterol: 249 mg/dL — ABNORMAL HIGH (ref <200)
HDL: 70 mg/dL (ref >=50)
LDL Cholesterol (Calc): 143 mg/dL (calc) — ABNORMAL HIGH
Non-HDL Cholesterol (Calc): 179 mg/dL (calc) — ABNORMAL HIGH (ref <130)
Total CHOL/HDL Ratio: 3.6 (calc) (ref <5.0)
Triglycerides: 215 mg/dL — ABNORMAL HIGH (ref <150)

## 2018-09-17 LAB — URINALYSIS, ROUTINE W REFLEX MICROSCOPIC
Bilirubin Urine: NEGATIVE
Glucose, UA: NEGATIVE
Hgb urine dipstick: NEGATIVE
Ketones, ur: NEGATIVE
Leukocytes,Ua: NEGATIVE
Nitrite: NEGATIVE
Protein, ur: NEGATIVE
Specific Gravity, Urine: 1.026 (ref 1.001–1.03)
pH: 5.5 (ref 5.0–8.0)

## 2018-09-17 LAB — COMPLETE METABOLIC PANEL WITH GFR
AG Ratio: 1.6 (calc) (ref 1.0–2.5)
ALT: 31 U/L — ABNORMAL HIGH (ref 6–29)
AST: 31 U/L (ref 10–35)
Albumin: 4.5 g/dL (ref 3.6–5.1)
Alkaline phosphatase (APISO): 96 U/L (ref 37–153)
BUN: 16 mg/dL (ref 7–25)
CO2: 27 mmol/L (ref 20–32)
Calcium: 9.8 mg/dL (ref 8.6–10.4)
Chloride: 102 mmol/L (ref 98–110)
Creat: 0.8 mg/dL (ref 0.50–1.05)
GFR, Est African American: 94 mL/min/{1.73_m2} (ref >=60)
GFR, Est Non African American: 81 mL/min/{1.73_m2} (ref >=60)
Globulin: 2.8 g/dL (calc) (ref 1.9–3.7)
Glucose, Bld: 110 mg/dL — ABNORMAL HIGH (ref 65–99)
Potassium: 4.3 mmol/L (ref 3.5–5.3)
Sodium: 140 mmol/L (ref 135–146)
Total Bilirubin: 0.6 mg/dL (ref 0.2–1.2)
Total Protein: 7.3 g/dL (ref 6.1–8.1)

## 2018-09-17 LAB — CBC
HCT: 40.2 % (ref 35.0–45.0)
Hemoglobin: 14.5 g/dL (ref 11.7–15.5)
MCH: 28.3 pg (ref 27.0–33.0)
MCHC: 36.1 g/dL — ABNORMAL HIGH (ref 32.0–36.0)
MCV: 78.4 fL — ABNORMAL LOW (ref 80.0–100.0)
MPV: 12.1 fL (ref 7.5–12.5)
Platelets: 283 10*3/uL (ref 140–400)
RBC: 5.13 10*6/uL — ABNORMAL HIGH (ref 3.80–5.10)
RDW: 15.1 % — ABNORMAL HIGH (ref 11.0–15.0)
WBC: 5.3 10*3/uL (ref 3.8–10.8)

## 2018-09-30 ENCOUNTER — Encounter: Payer: Self-pay | Admitting: Physician Assistant

## 2018-10-04 ENCOUNTER — Other Ambulatory Visit: Payer: Self-pay | Admitting: Physician Assistant

## 2018-10-14 ENCOUNTER — Other Ambulatory Visit: Payer: Self-pay | Admitting: Physician Assistant

## 2018-10-14 DIAGNOSIS — F325 Major depressive disorder, single episode, in full remission: Secondary | ICD-10-CM

## 2018-11-01 ENCOUNTER — Ambulatory Visit (INDEPENDENT_AMBULATORY_CARE_PROVIDER_SITE_OTHER): Payer: 59 | Admitting: Sports Medicine

## 2018-11-01 ENCOUNTER — Ambulatory Visit (INDEPENDENT_AMBULATORY_CARE_PROVIDER_SITE_OTHER): Payer: 59

## 2018-11-01 ENCOUNTER — Other Ambulatory Visit: Payer: Self-pay

## 2018-11-01 ENCOUNTER — Encounter: Payer: Self-pay | Admitting: Sports Medicine

## 2018-11-01 DIAGNOSIS — M1711 Unilateral primary osteoarthritis, right knee: Secondary | ICD-10-CM

## 2018-11-01 MED ORDER — CELECOXIB 200 MG PO CAPS: ORAL_CAPSULE | ORAL | 2 refills | Status: DC

## 2018-11-01 NOTE — Assessment & Plan Note (Signed)
Injection as above. X-rays, rehab exercises given. Adding Celebrex to use as needed. Return to see me in a month.

## 2018-11-01 NOTE — Progress Notes (Signed)
Subjective:    CC: Right knee pain  HPI: Megan [name] is a very pleasant 58 year old female, for some time now, months she is had pain in her right knee, medial joint line, no trauma, no mechanical symptoms, she is tried some oral NSAIDs without any improvement.  Moderate, persistent, localized without radiation.  I reviewed the past medical history, family history, social history, surgical history, and allergies today and no changes were needed.  Please see the problem list section below in epic for further details.  Past Medical History: Past Medical History:  Diagnosis Date  . Acute respiratory failure ([place])   . GERD (gastroesophageal reflux disease)   . Hypertension   . Menopausal vasomotor syndrome   . Obesity   . Pre-diabetes   . RSV (respiratory syncytial virus pneumonia)    Past Surgical History: Past Surgical History:  Procedure Laterality Date  . ANKLE ARTHROSCOPY Left 1985  . DILATION AND CURETTAGE OF UTERUS N/A 08/14/2017   Procedure: DILATATION AND CURETTAGE;  Surgeon: Emily Filbert, MD;  Location: [facility];  Service: Gynecology;  Laterality: N/A;   Social History: Social History   Socioeconomic History  . Marital status: Married    Spouse name: Not on file  . Number of children: 4  . Years of education: Not on file  . Highest education level: Not on file  Occupational History  . Not on file  Social Needs  . Financial resource strain: Not on file  . Food insecurity    Worry: Not on file    Inability: Not on file  . Transportation needs    Medical: Not on file    Non-medical: Not on file  Tobacco Use  . Smoking status: Never Smoker  . Smokeless tobacco: Never Used  Substance and Sexual Activity  . Alcohol use: Never    Frequency: Never  . Drug use: Never  . Sexual activity: Yes    Birth control/protection: Post-menopausal  Lifestyle  . Physical activity    Days per week: Not on file    Minutes per session: Not on file  . Stress: Not on  file  Relationships  . Social Herbalist on phone: Not on file    Gets together: Not on file    Attends religious service: Not on file    Active member of club or organization: Not on file    Attends meetings of clubs or organizations: Not on file    Relationship status: Not on file  Other Topics Concern  . Not on file  Social History Narrative   Lives at home with wife and teenage daughter   Family History: Family History  Problem Relation Age of Onset  . Heart disease Mother   . Hypertension Mother   . Heart disease Father   . Breast cancer Maternal Grandmother    Allergies: Allergies  Allergen Reactions  . Hydroxyzine Hcl Other (See Comments)    hypersomnolence   Medications: See med rec.  Review of Systems: No fevers, chills, night sweats, weight loss, chest pain, or shortness of breath.   Objective:    General: Well Developed, well nourished, and in no acute distress.  Neuro: Alert and oriented x3, extra-ocular muscles intact, sensation grossly intact.  HEENT: Normocephalic, atraumatic, pupils equal round reactive to light, neck supple, no masses, no lymphadenopathy, thyroid nonpalpable.  Skin: Warm and dry, no rashes. Cardiac: Regular rate and rhythm, no murmurs rubs or gallops, no lower extremity edema.  Respiratory: Clear to  auscultation bilaterally. Not using accessory muscles, speaking in full sentences. Right knee: Normal to inspection with no erythema or effusion or obvious bony abnormalities. Palpation normal with no warmth or joint line tenderness or patellar tenderness or condyle tenderness. ROM normal in flexion and extension and lower leg rotation. Ligaments with solid consistent endpoints including ACL, PCL, LCL, MCL. Negative Mcmurray's and provocative meniscal tests. Non painful patellar compression. Patellar and quadriceps tendons unremarkable. Hamstring and quadriceps strength is normal.  Procedure: Real-time Ultrasound Guided  injection of the right knee Device: GE Logiq E  Verbal informed consent obtained.  Time-out conducted.  Noted no overlying erythema, induration, or other signs of local infection.  Skin prepped in a sterile fashion.  Local anesthesia: Topical Ethyl chloride.  With sterile technique and under real time ultrasound guidance:  1 cc Kenalog 40, 2 cc lidocaine, 2 cc bupivacaine injected easily Completed without difficulty  Pain immediately resolved suggesting accurate placement of the medication.  Advised to call if fevers/chills, erythema, induration, drainage, or persistent bleeding.  Images permanently stored and available for review in the ultrasound unit.  Impression: Technically successful ultrasound guided injection.  Impression and Recommendations:    Primary osteoarthritis of right knee Injection as above. X-rays, rehab exercises given. Adding Celebrex to use as needed. Return to see me in a month.   ___________________________________________ Gwen Her. Dianah Field, M.D., ABFM., CAQSM. Primary Care and Sports Medicine [facility]  Adjunct Professor of [employer] [facility] [employer]

## 2018-11-06 ENCOUNTER — Encounter: Payer: Self-pay | Admitting: Sports Medicine

## 2018-11-06 ENCOUNTER — Ambulatory Visit (INDEPENDENT_AMBULATORY_CARE_PROVIDER_SITE_OTHER)
Admission: RE | Admit: 2018-11-06 | Discharge: 2018-11-06 | Disposition: A | Discharge status: Home / Self Care | Payer: 59 | Source: Ambulatory Visit

## 2018-11-06 DIAGNOSIS — M1711 Unilateral primary osteoarthritis, right knee: Secondary | ICD-10-CM

## 2018-11-06 DIAGNOSIS — M25561 Pain in right knee: Secondary | ICD-10-CM

## 2018-11-06 MED ORDER — CELECOXIB 200 MG PO CAPS: ORAL_CAPSULE | ORAL | 2 refills | Status: DC

## 2018-11-06 NOTE — Discharge Instructions (Signed)
I have called same medicine to [facility], this should be cheaper without insurance. Please give me a call at [phone] if you have any problems with the medication.  If noticing redness to the joint, unable to bend knee, fever, go to the ED for further evaluation possible infectious joint.

## 2018-11-06 NOTE — ED Provider Notes (Signed)
Virtual Visit via Video Note:  Megan [name]  initiated request for Telemedicine visit with [facility] Urgent Care team. I connected with Megan [name]  on 11/06/2018 at 9:27 AM  for a synchronized telemedicine visit using a video enabled HIPPA compliant telemedicine application. I verified that I am speaking with Megan [name]  using two identifiers. Misha Vanoverbeke Jodell Cipro, PA-C  was physically located in a [facility] Urgent care site and Camree Wigington was located at a different location.   The limitations of evaluation and management by telemedicine as well as the availability of in-person appointments were discussed. Patient was informed that she  may incur a bill ( including co-pay) for this virtual visit encounter. Megan [name]  expressed understanding and gave verbal consent to proceed with virtual visit.     History of Present Illness:Megan [name]  is a 58 y.o. female presents with increased right knee pain after cortisone injection 11/01/2018.  Patient states had significant improvement of pain with cortisone injection, however, starting last night, having pain to the right knee with difficulty bending, weight bearing. Denies new injury/trauma. Denies increased use of the knee.  Patient states has chronic swelling of the knee without worsening.  Denies erythema, warmth, fever Pain is to the whole knee, and can radiate to the calf.  Denies calf swelling, erythema.  Denies tenderness to palpation of the calf.  Patient was given Celebrex to take for symptoms, but not covered under insurance, and would have been $400 to pick up.   Past Medical History:  Diagnosis Date  . Acute respiratory failure ([place])   . GERD (gastroesophageal reflux disease)   . Hypertension   . Menopausal vasomotor syndrome   . Obesity   . Pre-diabetes   . RSV (respiratory syncytial virus pneumonia)     Allergies  Allergen Reactions  . Hydroxyzine Hcl Other (See Comments)    hypersomnolence         Observations/Objective: General: Well appearing, nontoxic, no acute distress. Sitting comfortably. Head: Normocephalic, atraumatic Eye: No conjunctival injection, eyelid swelling. EOMI ENT: Mucus membranes moist, no lip cracking. No obvious nasal drainage. Pulm: Speaking in full sentences without difficulty. Normal effort. No respiratory distress, accessory muscle use. MSK: Generalized swelling to right knee.  No erythema, warmth.  Contusion to the lateral knee, patient states around injection site.  Patient with some tenderness to self palpation of medial knee.  Difficult and decreased range of motion, but able to flex and extend on own.  No tenderness to palpation of the calf.  No obvious swelling, erythema of the calf. Neuro: Normal mental status. Alert and oriented x 3.   Assessment and Plan: Patient with history of primary osteoarthritis of right knee, received cortisone injection 11/01/2018.  At first with improved pain, but had increased pain last night.  Denies new injury/trauma. No erythema, warmth, fever. Able to flex and extend knee though with pain. Low suspicion for septic joint at this time.   Celebrex not covered under insurance. Discussed can send different Rx of NSAID vs send celebrex to different pharmacy that is more affordable with GoodRx. Patient would like celebrex to be called into different pharmacy, and will use goodrx. Also advised knee sleeve, ice compress and to follow up with Dr Dianah Field if symptoms not improving.  Follow Up Instructions:    I discussed the assessment and treatment plan with the patient. The patient was provided an opportunity to ask questions and all were answered. The patient agreed with the plan and demonstrated an  understanding of the instructions.   The patient was advised to call back or seek an in-person evaluation if the symptoms worsen or if the condition fails to improve as anticipated.  I provided 15 minutes of non-face-to-face time  during this encounter.    Ok Edwards, PA-C  11/06/2018 9:27 AM         Ok Edwards, PA-C 11/06/18 [phone]

## 2018-11-08 ENCOUNTER — Encounter: Payer: Self-pay | Admitting: Sports Medicine

## 2018-11-12 ENCOUNTER — Other Ambulatory Visit: Payer: Self-pay | Admitting: Physician Assistant

## 2018-11-12 DIAGNOSIS — F325 Major depressive disorder, single episode, in full remission: Secondary | ICD-10-CM

## 2018-11-29 ENCOUNTER — Other Ambulatory Visit: Payer: Self-pay

## 2018-11-29 ENCOUNTER — Ambulatory Visit (INDEPENDENT_AMBULATORY_CARE_PROVIDER_SITE_OTHER): Payer: 59 | Admitting: Sports Medicine

## 2018-11-29 ENCOUNTER — Encounter: Payer: Self-pay | Admitting: Sports Medicine

## 2018-11-29 DIAGNOSIS — M1711 Unilateral primary osteoarthritis, right knee: Secondary | ICD-10-CM

## 2018-11-29 MED ORDER — TRAMADOL HCL 50 MG PO TABS: 50.0000 mg | ORAL_TABLET | Freq: Three times a day (TID) | ORAL | 0 refills | Status: DC | PRN

## 2018-11-29 NOTE — Progress Notes (Signed)
Subjective:    CC: Right knee pain  HPI: Megan [name] is a pleasant 58 year old female with end-stage right knee osteoarthritis, we did a steroid injection at the last visit with ultrasound guidance, she had temporary relief with recurrence of her pain.  She is on Celebrex, she is done some home rehab, x-rays show end-stage bone-on-bone medial compartment and patellofemoral osteoarthritis, she is here for further advice.  I reviewed the past medical history, family history, social history, surgical history, and allergies today and no changes were needed.  Please see the problem list section below in epic for further details.  Past Medical History: Past Medical History:  Diagnosis Date  . Acute respiratory failure ([place])   . GERD (gastroesophageal reflux disease)   . Hypertension   . Menopausal vasomotor syndrome   . Obesity   . Pre-diabetes   . RSV (respiratory syncytial virus pneumonia)    Past Surgical History: Past Surgical History:  Procedure Laterality Date  . ANKLE ARTHROSCOPY Left 1985  . DILATION AND CURETTAGE OF UTERUS N/A 08/14/2017   Procedure: DILATATION AND CURETTAGE;  Surgeon: Emily Filbert, MD;  Location: [facility];  Service: Gynecology;  Laterality: N/A;   Social History: Social History   Socioeconomic History  . Marital status: Married    Spouse name: Not on file  . Number of children: 4  . Years of education: Not on file  . Highest education level: Not on file  Occupational History  . Not on file  Social Needs  . Financial resource strain: Not on file  . Food insecurity    Worry: Not on file    Inability: Not on file  . Transportation needs    Medical: Not on file    Non-medical: Not on file  Tobacco Use  . Smoking status: Never Smoker  . Smokeless tobacco: Never Used  Substance and Sexual Activity  . Alcohol use: Never    Frequency: Never  . Drug use: Never  . Sexual activity: Yes    Birth control/protection: Post-menopausal  Lifestyle   . Physical activity    Days per week: Not on file    Minutes per session: Not on file  . Stress: Not on file  Relationships  . Social Herbalist on phone: Not on file    Gets together: Not on file    Attends religious service: Not on file    Active member of club or organization: Not on file    Attends meetings of clubs or organizations: Not on file    Relationship status: Not on file  Other Topics Concern  . Not on file  Social History Narrative   Lives at home with wife and teenage daughter   Family History: Family History  Problem Relation Age of Onset  . Heart disease Mother   . Hypertension Mother   . Heart disease Father   . Breast cancer Maternal Grandmother    Allergies: Allergies  Allergen Reactions  . Hydroxyzine Hcl Other (See Comments)    hypersomnolence   Medications: See med rec.  Review of Systems: No fevers, chills, night sweats, weight loss, chest pain, or shortness of breath.   Objective:    General: Well Developed, well nourished, and in no acute distress.  Neuro: Alert and oriented x3, extra-ocular muscles intact, sensation grossly intact.  HEENT: Normocephalic, atraumatic, pupils equal round reactive to light, neck supple, no masses, no lymphadenopathy, thyroid nonpalpable.  Skin: Warm and dry, no rashes. Cardiac: Regular rate  and rhythm, no murmurs rubs or gallops, no lower extremity edema.  Respiratory: Clear to auscultation bilaterally. Not using accessory muscles, speaking in full sentences. Right knee: Tender to palpation at the medial joint line, medial patellar facet.  X-rays show end-stage bone-on-bone medial compartment and patellofemoral osteoarthritis.  Impression and Recommendations:    Primary osteoarthritis of right knee End-stage tricompartmental osteoarthritis worst in the medial compartment. Pain is patellofemoral and medial tibiofemoral. Continue Celebrex, adding tramadol. Did not respond to intra-articular  injection. Continuing bone-on-bone nature of the medial compartment I think she is a candidate now for total knee arthroplasty, referral placed.   ___________________________________________ Gwen Her. Dianah Field, M.D., ABFM., CAQSM. Primary Care and Sports Medicine [place] [facility]  Adjunct Professor of [employer]

## 2018-11-29 NOTE — Assessment & Plan Note (Signed)
End-stage tricompartmental osteoarthritis worst in the medial compartment. Pain is patellofemoral and medial tibiofemoral. Continue Celebrex, adding tramadol. Did not respond to intra-articular injection. Continuing bone-on-bone nature of the medial compartment I think she is a candidate now for total knee arthroplasty, referral placed.

## 2018-12-08 ENCOUNTER — Encounter: Payer: Self-pay | Admitting: Sports Medicine

## 2018-12-20 ENCOUNTER — Other Ambulatory Visit: Payer: Self-pay | Admitting: *Deleted

## 2018-12-20 DIAGNOSIS — F325 Major depressive disorder, single episode, in full remission: Secondary | ICD-10-CM

## 2018-12-20 MED ORDER — METFORMIN HCL 500 MG PO TABS: ORAL_TABLET | ORAL | 0 refills | Status: DC

## 2018-12-20 MED ORDER — SERTRALINE HCL 50 MG PO TABS: 50.0000 mg | ORAL_TABLET | Freq: Every day | ORAL | 3 refills | Status: DC

## 2019-01-02 ENCOUNTER — Other Ambulatory Visit: Payer: Self-pay | Admitting: Physician Assistant

## 2019-01-02 DIAGNOSIS — K219 Gastro-esophageal reflux disease without esophagitis: Secondary | ICD-10-CM

## 2019-01-02 DIAGNOSIS — K449 Diaphragmatic hernia without obstruction or gangrene: Secondary | ICD-10-CM

## 2019-01-20 ENCOUNTER — Telehealth: Payer: Self-pay | Admitting: *Deleted

## 2019-01-20 NOTE — Telephone Encounter (Signed)
Yes we can, though since she was bone on bone I had doubts about the efficacy, we can try if she is game, Barnet Pall would you work on [employer]?

## 2019-01-20 NOTE — Telephone Encounter (Signed)
Pt left vm stating that you referred her to Dr. Nadara Mustard (?) with [facility] for knee replacement but he is wanting to do [employer].  She wanted to know if she can just do those here with you instead of with his office.  Please advise.

## 2019-01-21 NOTE — Telephone Encounter (Signed)
I have submitted the information to [facility]. Waiting on response.

## 2019-02-01 NOTE — Telephone Encounter (Signed)
I called patient to verify the correct insurance card and the patient reports she already completed the shots at the Orthopedic office. No other questions at this time.

## 2019-02-18 HISTORY — PX: REPLACEMENT TOTAL KNEE: SUR1224

## 2019-03-16 ENCOUNTER — Other Ambulatory Visit: Payer: Self-pay | Admitting: Sports Medicine

## 2019-03-16 DIAGNOSIS — Z1231 Encounter for screening mammogram for malignant neoplasm of breast: Secondary | ICD-10-CM

## 2019-03-23 ENCOUNTER — Ambulatory Visit (INDEPENDENT_AMBULATORY_CARE_PROVIDER_SITE_OTHER): Payer: 59

## 2019-03-23 ENCOUNTER — Other Ambulatory Visit: Payer: Self-pay

## 2019-03-23 DIAGNOSIS — Z1231 Encounter for screening mammogram for malignant neoplasm of breast: Secondary | ICD-10-CM | POA: Diagnosis not present

## 2019-04-25 ENCOUNTER — Other Ambulatory Visit: Payer: Self-pay | Admitting: *Deleted

## 2019-04-25 DIAGNOSIS — K219 Gastro-esophageal reflux disease without esophagitis: Secondary | ICD-10-CM

## 2019-04-25 MED ORDER — LANSOPRAZOLE 30 MG PO CPDR: 30.0000 mg | DELAYED_RELEASE_CAPSULE | Freq: Every day | ORAL | 0 refills | Status: DC

## 2019-04-26 ENCOUNTER — Encounter: Payer: Self-pay | Admitting: *Deleted

## 2019-04-26 ENCOUNTER — Other Ambulatory Visit: Payer: Self-pay | Admitting: *Deleted

## 2019-04-26 DIAGNOSIS — F325 Major depressive disorder, single episode, in full remission: Secondary | ICD-10-CM

## 2019-04-26 MED ORDER — SERTRALINE HCL 50 MG PO TABS: 50.0000 mg | ORAL_TABLET | Freq: Every day | ORAL | 0 refills | Status: DC

## 2019-05-09 DIAGNOSIS — M1711 Unilateral primary osteoarthritis, right knee: Secondary | ICD-10-CM

## 2019-05-09 NOTE — Telephone Encounter (Signed)
Forwarding to provider.

## 2019-05-10 NOTE — Addendum Note (Signed)
Addended by: Silverio Decamp on: 05/10/2019 05:02 PM   Modules accepted: Orders

## 2019-05-12 ENCOUNTER — Telehealth: Payer: Self-pay | Admitting: General Practice

## 2019-05-12 NOTE — Telephone Encounter (Signed)
Patient wanting to know if she can have skin tag frozen off or taken off. She did not want to come in for an evaluation just one visit to do it all. Skin tag is under eye. Please advise. Wants to establish with Dr.Alexander.

## 2019-05-16 NOTE — Telephone Encounter (Signed)
Needs appt to establish for care of depression and blood sugars. Can discuss at visit when establishing and provider can advise if it can be done here or not.   Thanks!

## 2019-05-16 NOTE — Telephone Encounter (Signed)
PT scheduled

## 2019-05-19 ENCOUNTER — Encounter: Payer: Self-pay | Admitting: Medical-Surgical

## 2019-05-19 ENCOUNTER — Ambulatory Visit (INDEPENDENT_AMBULATORY_CARE_PROVIDER_SITE_OTHER): Payer: 59 | Admitting: Medical-Surgical

## 2019-05-19 ENCOUNTER — Other Ambulatory Visit: Payer: Self-pay

## 2019-05-19 ENCOUNTER — Telehealth: Payer: Self-pay | Admitting: Medical-Surgical

## 2019-05-19 VITALS — BP 108/76 | HR 85 | Temp 98.1°F | Ht 64.0 in | Wt 233.3 lb

## 2019-05-19 DIAGNOSIS — I10 Essential (primary) hypertension: Secondary | ICD-10-CM | POA: Diagnosis not present

## 2019-05-19 DIAGNOSIS — F325 Major depressive disorder, single episode, in full remission: Secondary | ICD-10-CM

## 2019-05-19 DIAGNOSIS — E785 Hyperlipidemia, unspecified: Secondary | ICD-10-CM

## 2019-05-19 DIAGNOSIS — R7303 Prediabetes: Secondary | ICD-10-CM

## 2019-05-19 DIAGNOSIS — E8881 Metabolic syndrome: Secondary | ICD-10-CM | POA: Diagnosis not present

## 2019-05-19 DIAGNOSIS — L918 Other hypertrophic disorders of the skin: Secondary | ICD-10-CM | POA: Diagnosis not present

## 2019-05-19 LAB — POCT GLYCOSYLATED HEMOGLOBIN (HGB A1C): Hemoglobin A1C: 5.8 % — AB (ref 4.0–5.6)

## 2019-05-19 MED ORDER — SERTRALINE HCL 50 MG PO TABS: 50.0000 mg | ORAL_TABLET | Freq: Every day | ORAL | 1 refills | Status: DC

## 2019-05-19 MED ORDER — OZEMPIC (0.25 OR 0.5 MG/DOSE) 2 MG/1.5ML ~~LOC~~ SOPN: 0.5000 mg | PEN_INJECTOR | SUBCUTANEOUS | 2 refills | Status: DC

## 2019-05-19 NOTE — Telephone Encounter (Signed)
Received fax for PA on Ozempic sent through cover my meds waiting on determination. - CF

## 2019-05-19 NOTE — Progress Notes (Signed)
Subjective:    CC: Establish care, skin tag, medication refills  HPI: Pleasant 59 year old female presenting today to establish care. Would also like to discuss chronic conditions and and skin tag removal.  HTN- checking BP at home with readings in 100s/70s.  Taking hydrochlorothiazide 25 mg daily as prescribed.  Tolerating well with no side effects.  Denies chest pain, shortness of breath, palpitations, lower extremity edema, dizziness, and headaches.  Prediabetes-previously prescribed Metformin but was unable to tolerate this due to severe gastrointestinal side effects.  Has not been as careful with her diet.  Is interested in other options to help manage glucose and weight loss.  Obesity-has not been exercising regularly as she has significant knee discomfort.  Has a knee replacement planned in the upcoming months.  Feels as if she has gained weight.  Is interested in medications to help facilitate weight loss.  Depression/anxiety-stable on Zoloft 50 mg daily.  HLD-not as careful with her diet as of late.  No current medication therapy.  Skin tag-small skin tag on the left cheek just below the eye.  This has been there for a couple of months.  Since it got a little larger, she is able to see it and would like to have it removed.  I reviewed the past medical history, family history, social history, surgical history, and allergies today and no changes were needed.  Please see the problem list section below in epic for further details.  Past Medical History: Past Medical History:  Diagnosis Date  . Acute respiratory failure ([place])   . GERD (gastroesophageal reflux disease)   . Hypertension   . Menopausal vasomotor syndrome   . Obesity   . Pre-diabetes   . RSV (respiratory syncytial virus pneumonia)    Past Surgical History: Past Surgical History:  Procedure Laterality Date  . ANKLE ARTHROSCOPY Left 1985  . DILATION AND CURETTAGE OF UTERUS N/A 08/14/2017   Procedure: DILATATION  AND CURETTAGE;  Surgeon: Emily Filbert, MD;  Location: [facility];  Service: Gynecology;  Laterality: N/A;   Social History: Social History   Socioeconomic History  . Marital status: Married    Spouse name: Not on file  . Number of children: 4  . Years of education: Not on file  . Highest education level: Not on file  Occupational History  . Not on file  Tobacco Use  . Smoking status: Never Smoker  . Smokeless tobacco: Never Used  Substance and Sexual Activity  . Alcohol use: Never  . Drug use: Never  . Sexual activity: Yes    Birth control/protection: Post-menopausal  Other Topics Concern  . Not on file  Social History Narrative   Lives at home with wife and teenage daughter   Social Determinants of Health   Financial Resource Strain:   . Difficulty of Paying Living Expenses:   Food Insecurity:   . Worried About Charity fundraiser in the Last Year:   . Arboriculturist in the Last Year:   Transportation Needs:   . Film/video editor (Medical):   Marland Kitchen Lack of Transportation (Non-Medical):   Physical Activity:   . Days of Exercise per Week:   . Minutes of Exercise per Session:   Stress:   . Feeling of Stress :   Social Connections:   . Frequency of Communication with Friends and Family:   . Frequency of Social Gatherings with Friends and Family:   . Attends Religious Services:   . Active Member of  Clubs or Organizations:   . Attends Archivist Meetings:   Marland Kitchen Marital Status:    Family History: Family History  Problem Relation Age of Onset  . Heart disease Mother   . Hypertension Mother   . Heart disease Father   . Breast cancer Maternal Grandmother    Allergies: No Active Allergies Medications: See med rec.  Review of Systems: No fevers, chills, night sweats, weight loss, chest pain, or shortness of breath.   Objective:    General: Well Developed, well nourished, and in no acute distress.  Neuro: Alert and oriented x3.  HEENT:  Normocephalic, atraumatic.  Skin: Warm and dry.  Small flesh-colored skin tag approximately 1 inch below the outer corner of the left eye. Cardiac: Regular rate and rhythm, no murmurs rubs or gallops, no lower extremity edema.  Respiratory: Clear to auscultation bilaterally. Not using accessory muscles, speaking in full sentences.   Impression and Recommendations:    1. Skin tag Discussed options for removal.  Patient agreeable to cryotherapy. Cryotherapy template Procedure: Cryodestruction of: Consent obtained and verified. Time-out conducted. Noted no overlying erythema, induration, or other signs of local infection. Completed without difficulty using Cryo-Gun. Advised to call if fevers/chills, erythema, induration, drainage, or persistent bleeding.  2. Essential hypertension Checking CBC, CMP today.  Continue hydrochlorothiazide 25 mg daily.  Continue checking blood pressure at home.  Reviewed low-sodium diet.  3. Hyperlipidemia, unspecified hyperlipidemia type Checking lipid panel.  Reviewed importance of a heart healthy, low-fat diet.  4.  Major depressive disorder with single episode, in full remission (HCC) Continue Zoloft 50 mg daily.  Refill provided.  5. Prediabetes POCT hemoglobin A1c 5.8% in office today.  Discussed prediabetes and dietary modifications to prevent progression to full-blown diabetes.  6. Metabolic syndrome Patient with hypertension, prediabetes, hyperlipidemia, and obesity.  Discussed management of comorbid conditions and reduction of risk for developing full-blown diabetes.  Patient interested and weight loss and glucose management.  Starting semaglutide 0.5 mg subcu weekly.  If unable to tolerate 0.5 mg dose without significant side effects, patient advised to lower dose to 0.25 mg for a couple of weeks before returning to the higher dose.  Return in about 3 months (around 08/18/2019) for weight management follow  up. ___________________________________________ Clearnce Sorrel, DNP, APRN, FNP-BC Primary Care and [place]

## 2019-05-20 LAB — CBC
HCT: 36.4 % (ref 35.0–45.0)
Hemoglobin: 10.9 g/dL — ABNORMAL LOW (ref 11.7–15.5)
MCH: 21.7 pg — ABNORMAL LOW (ref 27.0–33.0)
MCHC: 29.9 g/dL — ABNORMAL LOW (ref 32.0–36.0)
MCV: 72.4 fL — ABNORMAL LOW (ref 80.0–100.0)
MPV: 11.4 fL (ref 7.5–12.5)
Platelets: 289 10*3/uL (ref 140–400)
RBC: 5.03 10*6/uL (ref 3.80–5.10)
RDW: 16.6 % — ABNORMAL HIGH (ref 11.0–15.0)
WBC: 5.2 10*3/uL (ref 3.8–10.8)

## 2019-05-20 LAB — LIPID PANEL W/REFLEX DIRECT LDL
Cholesterol: 256 mg/dL — ABNORMAL HIGH (ref <200)
HDL: 86 mg/dL (ref >=50)
LDL Cholesterol (Calc): 144 mg/dL (calc) — ABNORMAL HIGH
Non-HDL Cholesterol (Calc): 170 mg/dL (calc) — ABNORMAL HIGH (ref <130)
Total CHOL/HDL Ratio: 3 (calc) (ref <5.0)
Triglycerides: 135 mg/dL (ref <150)

## 2019-05-20 LAB — COMPLETE METABOLIC PANEL WITH GFR
AG Ratio: 1.7 (calc) (ref 1.0–2.5)
ALT: 32 U/L — ABNORMAL HIGH (ref 6–29)
AST: 31 U/L (ref 10–35)
Albumin: 4.2 g/dL (ref 3.6–5.1)
Alkaline phosphatase (APISO): 83 U/L (ref 37–153)
BUN: 15 mg/dL (ref 7–25)
CO2: 29 mmol/L (ref 20–32)
Calcium: 9.7 mg/dL (ref 8.6–10.4)
Chloride: 101 mmol/L (ref 98–110)
Creat: 0.79 mg/dL (ref 0.50–1.05)
GFR, Est African American: 95 mL/min/{1.73_m2} (ref >=60)
GFR, Est Non African American: 82 mL/min/{1.73_m2} (ref >=60)
Globulin: 2.5 g/dL (calc) (ref 1.9–3.7)
Glucose, Bld: 104 mg/dL — ABNORMAL HIGH (ref 65–99)
Potassium: 5 mmol/L (ref 3.5–5.3)
Sodium: 139 mmol/L (ref 135–146)
Total Bilirubin: 0.5 mg/dL (ref 0.2–1.2)
Total Protein: 6.7 g/dL (ref 6.1–8.1)

## 2019-05-20 LAB — IRON,TIBC AND FERRITIN PANEL
%SAT: 38 % (calc) (ref 16–45)
Ferritin: 8 ng/mL — ABNORMAL LOW (ref 16–232)
Iron: 164 ug/dL — ABNORMAL HIGH (ref 45–160)
TIBC: 428 mcg/dL (calc) (ref 250–450)

## 2019-05-26 ENCOUNTER — Encounter: Payer: Self-pay | Admitting: Physician Assistant

## 2019-05-26 ENCOUNTER — Other Ambulatory Visit: Payer: Self-pay | Admitting: Medical-Surgical

## 2019-05-26 DIAGNOSIS — K219 Gastro-esophageal reflux disease without esophagitis: Secondary | ICD-10-CM

## 2019-05-26 MED ORDER — LANSOPRAZOLE 30 MG PO CPDR: 30.0000 mg | DELAYED_RELEASE_CAPSULE | Freq: Every day | ORAL | 6 refills | Status: DC

## 2019-05-26 NOTE — Telephone Encounter (Signed)
Patient needs a refill of her Prevacid. No other questions.

## 2019-05-30 ENCOUNTER — Other Ambulatory Visit: Payer: Self-pay | Admitting: Physician Assistant

## 2019-05-30 DIAGNOSIS — I10 Essential (primary) hypertension: Secondary | ICD-10-CM

## 2019-05-31 NOTE — Telephone Encounter (Signed)
Received fax for more information for PA on Ozempic filled out and resubmitted through cover my meds waiting on determination. - CF

## 2019-06-14 ENCOUNTER — Telehealth: Payer: 59 | Admitting: Physician Assistant

## 2019-06-14 DIAGNOSIS — R109 Unspecified abdominal pain: Secondary | ICD-10-CM

## 2019-06-14 DIAGNOSIS — R197 Diarrhea, unspecified: Secondary | ICD-10-CM

## 2019-06-14 NOTE — Progress Notes (Signed)
Hi Judy,    I am sorry you are not feeling well. Unfortunately, this is too complex of a problem to manage through an e-visit. Please contact your PCP, or go to an Urgent Care.   Based on what you shared with me, I feel your condition warrants further evaluation and I recommend that you be seen for a face to face office visit.   NOTE: If you entered your credit card information for this eVisit, you will not be charged. You may see a "hold" on your card for the $35 but that hold will drop off and you will not have a charge processed.   If you are having a true medical emergency please call 911.      For an urgent face to face visit, [place] has five urgent care centers for your convenience:      NEW:  [facility] [place] [facility] [place] Get Driving Directions [phone] [address] [place], [place] [address] . 10 am - 6pm Monday - Friday    [place] [facility]) Get Driving Directions [phone] [address] [place], [place] [address] . 10 am to 8 pm Monday-Friday . 12 pm to 8 pm [facility] at [facility] Get Driving Directions [phone] [place], [place], [place] [address] . 8 am to 8 pm Monday-Friday . 9 am to 6 pm Saturday . 11 am to 6 pm Sunday     [facility] at [facility] Get Driving Directions  [phone] [address] [place], [place] [address] . 8 am to 8 pm Monday-Friday . 8 am to 4 pm [facility] at  Get Driving Directions [phone] [address] [place], [place] [address] . 12 pm to 6 pm Monday-Friday      Your e-visit answers were reviewed by a board certified advanced clinical practitioner to complete your personal care plan.  Thank you for using e-Visits.

## 2019-06-17 ENCOUNTER — Encounter: Payer: Self-pay | Admitting: Physician Assistant

## 2019-06-17 MED ORDER — PHENTERMINE HCL 8 MG PO TABS: ORAL_TABLET | ORAL | 0 refills | Status: DC

## 2019-06-17 MED ORDER — TOPIRAMATE 50 MG PO TABS: ORAL_TABLET | ORAL | 0 refills | Status: DC

## 2019-06-17 NOTE — Telephone Encounter (Signed)
I have not received any new information as of today on this I will call on Tuesday when I am working on PA's - CF

## 2019-06-21 ENCOUNTER — Telehealth: Payer: Self-pay | Admitting: Medical-Surgical

## 2019-06-21 NOTE — Telephone Encounter (Signed)
Received fax for PA on [place] sent through cover my meds waiting on determination. - CF

## 2019-06-27 NOTE — Telephone Encounter (Signed)
Received faxed form filled how got providers signature and faxed back now waiting on determination. - CF

## 2019-06-28 NOTE — Telephone Encounter (Signed)
Received fax from [facility] and they approved coverage on [place] from 06/27/19 - 09/25/19. - CF

## 2019-06-29 ENCOUNTER — Encounter: Payer: Self-pay | Admitting: Physician Assistant

## 2019-07-21 NOTE — Progress Notes (Signed)
Subjective:    CC: Weight check  HPI: Pleasant 59 year old female presenting today for weight check on phentermine and Topamax.  We originally tried to do Ozempic for weight loss but this was denied by her insurance.  Instead we started phentermine 8 mg with 50 mg Topamax daily.  She reported experiencing increased irritability after starting these medications so she stopped taking the Topamax for a few days.  She reports that she discovered the cause of her irritability was actually related to poor sleep and nightly leg cramps.  Since then she has made some changes and is now sleeping better.  Her irritability resolved and she has restarted the Topamax.  Reports she has been more active including walking and working in the yard/garden more.  Has not noticed a definitive change in her appetite but has been making healthier choices.  Recently got a new cat, Zeke, from the shelter and is working to get him adjusted.  I reviewed the past medical history, family history, social history, surgical history, and allergies today and no changes were needed.  Please see the problem list section below in epic for further details.  Past Medical History: Past Medical History:  Diagnosis Date  . Acute respiratory failure ([place])   . Diabetes mellitus without complication ([place])   . GERD (gastroesophageal reflux disease)   . Hypertension   . Menopausal vasomotor syndrome   . Obesity   . Pre-diabetes   . RSV (respiratory syncytial virus pneumonia)    Past Surgical History: Past Surgical History:  Procedure Laterality Date  . ANKLE ARTHROSCOPY Left 1985  . DILATION AND CURETTAGE OF UTERUS N/A 08/14/2017   Procedure: DILATATION AND CURETTAGE;  Surgeon: Emily Filbert, MD;  Location: [facility];  Service: Gynecology;  Laterality: N/A;   Social History: Social History   Socioeconomic History  . Marital status: Married    Spouse name: Not on file  . Number of children: 4  . Years of  education: Not on file  . Highest education level: Not on file  Occupational History  . Not on file  Tobacco Use  . Smoking status: Never Smoker  . Smokeless tobacco: Never Used  Substance and Sexual Activity  . Alcohol use: Not Currently  . Drug use: Never  . Sexual activity: Yes    Birth control/protection: Post-menopausal  Other Topics Concern  . Not on file  Social History Narrative   Lives at home with wife and teenage daughter   Social Determinants of Health   Financial Resource Strain:   . Difficulty of Paying Living Expenses:   Food Insecurity:   . Worried About Charity fundraiser in the Last Year:   . Arboriculturist in the Last Year:   Transportation Needs:   . Film/video editor (Medical):   Marland Kitchen Lack of Transportation (Non-Medical):   Physical Activity:   . Days of Exercise per Week:   . Minutes of Exercise per Session:   Stress:   . Feeling of Stress :   Social Connections:   . Frequency of Communication with Friends and Family:   . Frequency of Social Gatherings with Friends and Family:   . Attends Religious Services:   . Active Member of Clubs or Organizations:   . Attends Archivist Meetings:   Marland Kitchen Marital Status:    Family History: Family History  Problem Relation Age of Onset  . Heart disease Mother   . Hypertension Mother   . Heart disease  Father   . Breast cancer Maternal Grandmother    Allergies: No Known Allergies Medications: See med rec.  Review of Systems: See HPI for pertinent positives and negatives.   Objective:    General: Well Developed, well nourished, and in no acute distress.  Neuro: Alert and oriented x3.  HEENT: Normocephalic, atraumatic.  Skin: Warm and dry. Cardiac: Regular rate and rhythm, no murmurs rubs or gallops, no lower extremity edema.  Respiratory: Clear to auscultation bilaterally. Not using accessory muscles, speaking in full sentences.   Impression and Recommendations:    1. Class 2 severe  obesity due to excess calories with serious comorbidity in adult, unspecified BMI (HCC) Continue phentermine 8 mg and Topamax 50 mg daily.  Continue lifestyle modifications including healthy diet choices and increased activity. - Phentermine HCl 8 MG TABS; Take 1/2 tablet (79m) daily for 14 days then increase to 1 tablet (817m daily.  Dispense: 30 tablet; Refill: 0 - topiramate (TOPAMAX) 50 MG tablet; Take 1/2 tablet (2572mdaily for 14 days then increase to 1 tablet (48m24maily.  Dispense: 30 tablet; Refill: 0  Return in about 4 weeks (around 08/19/2019) for weight check. ___________________________________________ Latoshia Monrroy Clearnce SorrelP, APRN, FNP-BC Primary Care and [place]

## 2019-07-22 ENCOUNTER — Encounter: Payer: Self-pay | Admitting: Medical-Surgical

## 2019-07-22 ENCOUNTER — Ambulatory Visit (INDEPENDENT_AMBULATORY_CARE_PROVIDER_SITE_OTHER): Payer: 59 | Admitting: Medical-Surgical

## 2019-07-22 ENCOUNTER — Other Ambulatory Visit: Payer: Self-pay

## 2019-07-22 MED ORDER — TOPIRAMATE 50 MG PO TABS: ORAL_TABLET | ORAL | 0 refills | Status: DC

## 2019-07-22 MED ORDER — PHENTERMINE HCL 8 MG PO TABS: ORAL_TABLET | ORAL | 0 refills | Status: DC

## 2019-08-19 ENCOUNTER — Encounter: Payer: Self-pay | Admitting: Medical-Surgical

## 2019-08-19 ENCOUNTER — Other Ambulatory Visit: Payer: Self-pay

## 2019-08-19 ENCOUNTER — Encounter: Payer: Self-pay | Admitting: Physician Assistant

## 2019-08-19 ENCOUNTER — Ambulatory Visit (INDEPENDENT_AMBULATORY_CARE_PROVIDER_SITE_OTHER): Payer: 59 | Admitting: Medical-Surgical

## 2019-08-19 VITALS — BP 119/84 | HR 86 | Temp 98.4°F | Ht 64.0 in | Wt 218.0 lb

## 2019-08-19 MED ORDER — TOPIRAMATE 50 MG PO TABS: ORAL_TABLET | ORAL | 0 refills | Status: DC

## 2019-08-19 MED ORDER — PHENTERMINE HCL 8 MG PO TABS: ORAL_TABLET | ORAL | 0 refills | Status: DC

## 2019-08-19 NOTE — Progress Notes (Signed)
Subjective:    CC: weight check  HPI: Pleasant 59 year old presenting for a recheck today on phentermine with Topamax.  She has been taking medication daily, tolerating well.  She does note that she has occasional tingling in her fingers that is very transient in nature.  Otherwise no other side effects.  Denies chest pain, shortness of breath, palpitations, leg swelling.  Is continuing to try to increase her activity but her knee problems do tend to limit her exercise.  She is watching her diet, endorses decent appetite.  I reviewed the past medical history, family history, social history, surgical history, and allergies today and no changes were needed.  Please see the problem list section below in epic for further details.  Past Medical History: Past Medical History:  Diagnosis Date  . Acute respiratory failure ([place])   . Diabetes mellitus without complication ([place])   . GERD (gastroesophageal reflux disease)   . Hypertension   . Menopausal vasomotor syndrome   . Obesity   . Pre-diabetes   . RSV (respiratory syncytial virus pneumonia)    Past Surgical History: Past Surgical History:  Procedure Laterality Date  . ANKLE ARTHROSCOPY Left 1985  . DILATION AND CURETTAGE OF UTERUS N/A 08/14/2017   Procedure: DILATATION AND CURETTAGE;  Surgeon: Emily Filbert, MD;  Location: [facility];  Service: Gynecology;  Laterality: N/A;   Social History: Social History   Socioeconomic History  . Marital status: Married    Spouse name: Not on file  . Number of children: 4  . Years of education: Not on file  . Highest education level: Not on file  Occupational History  . Not on file  Tobacco Use  . Smoking status: Never Smoker  . Smokeless tobacco: Never Used  Vaping Use  . Vaping Use: Never used  Substance and Sexual Activity  . Alcohol use: Not Currently  . Drug use: Never  . Sexual activity: Yes    Birth control/protection: Post-menopausal  Other Topics Concern  . Not  on file  Social History Narrative   Lives at home with wife and teenage daughter   Social Determinants of Health   Financial Resource Strain:   . Difficulty of Paying Living Expenses:   Food Insecurity:   . Worried About Charity fundraiser in the Last Year:   . Arboriculturist in the Last Year:   Transportation Needs:   . Film/video editor (Medical):   Marland Kitchen Lack of Transportation (Non-Medical):   Physical Activity:   . Days of Exercise per Week:   . Minutes of Exercise per Session:   Stress:   . Feeling of Stress :   Social Connections:   . Frequency of Communication with Friends and Family:   . Frequency of Social Gatherings with Friends and Family:   . Attends Religious Services:   . Active Member of Clubs or Organizations:   . Attends Archivist Meetings:   Marland Kitchen Marital Status:    Family History: Family History  Problem Relation Age of Onset  . Heart disease Mother   . Hypertension Mother   . Heart disease Father   . Breast cancer Maternal Grandmother    Allergies: No Known Allergies Medications: See med rec.  Review of Systems: No fevers, chills, night sweats, weight loss, chest pain, or shortness of breath.   Objective:    General: Well Developed, well nourished, and in no acute distress.  Neuro: Alert and oriented x3.  HEENT: Normocephalic, atraumatic.  Skin: Warm and dry. Cardiac: Regular rate and rhythm, no murmurs rubs or gallops, no lower extremity edema.  Respiratory: Clear to auscultation bilaterally. Not using accessory muscles, speaking in full sentences.   Impression and Recommendations:    1. Class 2 severe obesity due to excess calories with serious comorbidity in adult, unspecified BMI (HCC) Additional 5 pound weight loss in the past 4 weeks.  Discussed weight loss expectations.  Patient remains very interested in using semaglutide for weight loss.  Discussed that this medication has now been approved by the FDA for weight loss.   Patient would like to see if we can switch over to this.  Advised patient that there is a bit of a wait as there is a process that we must go through to get started.  For now we will continue Topamax and phentermine but we will go ahead and start the process of getting [facility] sent through. Once Mancel Parsons is available, we will plan to stop the Phentermine and Topamax. - topiramate (TOPAMAX) 50 MG tablet; Take 1 tablet (17m) daily.  Dispense: 30 tablet; Refill: 0 - Phentermine HCl 8 MG TABS; Take 1 tablet (852m daily.  Dispense: 30 tablet; Refill: 0  Return in about 4 weeks (around 09/16/2019) for weight check.  ___________________________________________ JoClearnce SorrelDNP, APRN, FNP-BC Primary Care and Sp[place] Mate[place]

## 2019-08-23 ENCOUNTER — Other Ambulatory Visit: Payer: Self-pay | Admitting: Medical-Surgical

## 2019-08-23 MED ORDER — AMBULATORY NON FORMULARY MEDICATION: 0 refills | Status: DC

## 2019-08-23 NOTE — Progress Notes (Signed)
Pt aware of Joy's message below and states she will be coming by early next week to pick it up from the front desk.

## 2019-08-23 NOTE — Progress Notes (Signed)
Ordering [id number] as discussed with patient. Packet of information for sign up included with printed prescription. Paperwork placed in front office. Please contact patient to let her know she can come pick it up at her convenience. She will need to stop the phentermine and topamax in order to start the [facility] injections.

## 2019-09-08 ENCOUNTER — Encounter: Payer: Self-pay | Admitting: Medical-Surgical

## 2019-09-08 NOTE — Telephone Encounter (Signed)
PA required for multiple rxs.

## 2019-09-09 NOTE — Telephone Encounter (Signed)
Joy I have placed a form in your box to help me fill out for one of her medications. Can you help?

## 2019-09-13 ENCOUNTER — Encounter: Payer: Self-pay | Admitting: Medical-Surgical

## 2019-09-15 NOTE — Progress Notes (Signed)
Subjective:    CC: weight check  HPI: Pleasant 59 year old female presenting for weight check on phentermine and topamax.  Has been taking her medications daily and tolerating well.  She did go on vacation to the beach with her life and admits to gaining 6 pounds during the week they were there.  She has since then lost 6 pounds in the 20 days they have been back from the beach.  Continues to modify her diet and make healthier choices.  Unfortunately she is not exercising  as she hurt her right knee while at the beach walking on the sand. She has an appointment set up for evaluation for knee replacement so she will be able to get rid of this pain.  Denies chest pain, shortness of breath, palpitations, lower extremity edema, dizziness, and headaches.  At last visit we had discussed changing to [facility] for weight loss as this will be very beneficial due to her prediabetes and metabolic syndrome.  Unfortunately she has had significant struggles getting this from her pharmacy.  She was originally told that they had to get prior authorization through her insurance despite having a discount card.  When she switched to a different pharmacy she was told that her discount card was not the appropriate one for that medication.  She brought the printed prescription and discount card with her today to see if we could assist in getting the issue cleared up.  I reviewed the past medical history, family history, social history, surgical history, and allergies today and no changes were needed.  Please see the problem list section below in epic for further details.  Past Medical History: Past Medical History:  Diagnosis Date  . Acute respiratory failure ([place])   . Diabetes mellitus without complication ([place])   . GERD (gastroesophageal reflux disease)   . Hypertension   . Menopausal vasomotor syndrome   . Obesity   . Pre-diabetes   . RSV (respiratory syncytial virus pneumonia)    Past Surgical History: Past  Surgical History:  Procedure Laterality Date  . ANKLE ARTHROSCOPY Left 1985  . DILATION AND CURETTAGE OF UTERUS N/A 08/14/2017   Procedure: DILATATION AND CURETTAGE;  Surgeon: Emily Filbert, MD;  Location: [facility];  Service: Gynecology;  Laterality: N/A;   Social History: Social History   Socioeconomic History  . Marital status: Married    Spouse name: Not on file  . Number of children: 4  . Years of education: Not on file  . Highest education level: Not on file  Occupational History  . Not on file  Tobacco Use  . Smoking status: Never Smoker  . Smokeless tobacco: Never Used  Vaping Use  . Vaping Use: Never used  Substance and Sexual Activity  . Alcohol use: Not Currently  . Drug use: Never  . Sexual activity: Yes    Birth control/protection: Post-menopausal  Other Topics Concern  . Not on file  Social History Narrative   Lives at home with wife and teenage daughter   Social Determinants of Health   Financial Resource Strain:   . Difficulty of Paying Living Expenses:   Food Insecurity:   . Worried About Charity fundraiser in the Last Year:   . Arboriculturist in the Last Year:   Transportation Needs:   . Film/video editor (Medical):   Marland Kitchen Lack of Transportation (Non-Medical):   Physical Activity:   . Days of Exercise per Week:   . Minutes of Exercise per  Session:   Stress:   . Feeling of Stress :   Social Connections:   . Frequency of Communication with Friends and Family:   . Frequency of Social Gatherings with Friends and Family:   . Attends Religious Services:   . Active Member of Clubs or Organizations:   . Attends Archivist Meetings:   Marland Kitchen Marital Status:    Family History: Family History  Problem Relation Age of Onset  . Heart disease Mother   . Hypertension Mother   . Heart disease Father   . Breast cancer Maternal Grandmother    Allergies: No Known Allergies Medications: See med rec.  Review of Systems: See HPI  for pertinent positives and negatives.   Objective:    General: Well Developed, well nourished, and in no acute distress.  Neuro: Alert and oriented x3.  HEENT: Normocephalic, atraumatic.  Skin: Warm and dry. Cardiac: Regular rate and rhythm, no murmurs rubs or gallops, no lower extremity edema.  Respiratory: Clear to auscultation bilaterally. Not using accessory muscles, speaking in full sentences.   Impression and Recommendations:    1. Class 2 severe obesity due to excess calories with serious comorbidity in adult, unspecified BMI ([place]) She is doing very well overall for weight loss so we will go ahead and continue the phentermine 8 mg daily with the Topamax 50 mg daily.  Ultimately we would like to get her started on we will go the and stop the phentermine and Topamax.  We will be contacting the drug rep for [facility] today to see if she can assist [place] in getting this pushed through.  Encouraged to continue dietary and lifestyle modifications. - topiramate (TOPAMAX) 50 MG tablet; Take 1 tablet (29m) daily.  Dispense: 30 tablet; Refill: 0 - Phentermine HCl 8 MG TABS; Take 1 tablet (858m daily.  Dispense: 30 tablet; Refill: 0  Return in about 4 weeks (around 10/14/2019) for weight check. ___________________________________________ JoClearnce SorrelDNP, APRN, FNP-BC Primary Care and SpPleasant [place]

## 2019-09-16 ENCOUNTER — Encounter: Payer: Self-pay | Admitting: Medical-Surgical

## 2019-09-16 ENCOUNTER — Other Ambulatory Visit: Payer: Self-pay

## 2019-09-16 ENCOUNTER — Ambulatory Visit (INDEPENDENT_AMBULATORY_CARE_PROVIDER_SITE_OTHER): Payer: 59 | Admitting: Medical-Surgical

## 2019-09-16 VITALS — BP 113/79 | HR 89 | Temp 98.2°F | Ht 64.0 in | Wt 218.0 lb

## 2019-09-16 MED ORDER — TOPIRAMATE 50 MG PO TABS: ORAL_TABLET | ORAL | 0 refills | Status: DC

## 2019-09-16 MED ORDER — PHENTERMINE HCL 8 MG PO TABS: ORAL_TABLET | ORAL | 0 refills | Status: DC

## 2019-09-16 NOTE — Progress Notes (Signed)
I called Megan [name] with [phone] and she reports that the 0.25 mg dose is on backorder at this time. I have left a brief message for the patient with the information and if she is ok with using [facility] to let the office know if she ok with PCP changing the pharmacy. Patient was asked to call back with any questions or send a mychart message.

## 2019-09-23 ENCOUNTER — Other Ambulatory Visit: Payer: Self-pay | Admitting: Medical-Surgical

## 2019-09-23 ENCOUNTER — Encounter: Payer: Self-pay | Admitting: Medical-Surgical

## 2019-09-23 MED ORDER — WEGOVY 0.25 MG/0.5ML ~~LOC~~ SOAJ: 0.2500 mg | SUBCUTANEOUS | 0 refills | Status: DC

## 2019-09-23 NOTE — Progress Notes (Signed)
Patient interested in starting [facility] but had trouble with her pharmacy accepting the discount card. Sending to [facility] in [place] to see if they can get it to go through. If this works, the plan is to stop the topamax/phentermine and start Wegovy.

## 2019-09-26 ENCOUNTER — Encounter: Payer: Self-pay | Admitting: Medical-Surgical

## 2019-10-07 ENCOUNTER — Encounter: Payer: Self-pay | Admitting: Medical-Surgical

## 2019-10-14 ENCOUNTER — Ambulatory Visit: Payer: 59 | Admitting: Medical-Surgical

## 2019-10-21 MED ORDER — WEGOVY 0.5 MG/0.5ML ~~LOC~~ SOAJ: 0.5000 mg | SUBCUTANEOUS | 0 refills | Status: DC

## 2019-10-28 NOTE — Telephone Encounter (Signed)
Spoke with Helene Kelp at [facility] on [address] and she said they do not have any in stock at the moment but they should on Monday 10/31/2019.

## 2019-11-07 ENCOUNTER — Other Ambulatory Visit: Payer: Self-pay | Admitting: Medical-Surgical

## 2019-11-07 DIAGNOSIS — F325 Major depressive disorder, single episode, in full remission: Secondary | ICD-10-CM

## 2019-11-11 ENCOUNTER — Ambulatory Visit (INDEPENDENT_AMBULATORY_CARE_PROVIDER_SITE_OTHER): Payer: 59 | Admitting: Medical-Surgical

## 2019-11-11 ENCOUNTER — Encounter: Payer: Self-pay | Admitting: Medical-Surgical

## 2019-11-11 VITALS — BP 127/79 | HR 78 | Temp 98.1°F | Ht 64.0 in | Wt 212.0 lb

## 2019-11-11 DIAGNOSIS — F325 Major depressive disorder, single episode, in full remission: Secondary | ICD-10-CM

## 2019-11-11 MED ORDER — SERTRALINE HCL 50 MG PO TABS: 50.0000 mg | ORAL_TABLET | Freq: Every day | ORAL | 1 refills | Status: DC

## 2019-11-11 NOTE — Progress Notes (Signed)
Subjective:    CC: weight check  HPI: Pleasant 59 year old female presenting for weight check on Wegovy. Unfortunately, she had to go for 3 weeks without the medication because one pen malfunctioned and the new prescription was not available for two weeks at the pharmacy. She has now started the 0.79m SQ weekly and is tolerating well. Endorses she has been burping more since starting the medication. Notably decreased appetite. Continues to make healthy choices and working on portion control. Her surgery for her right knee planned on Tuesday and if she does well, she will go home on Wednesday. Plan in place for PT to start next Friday. Denies fever, chills, shortness of breath, chest pain, nausea, vomiting, and diarrhea.  Taking Zoloft 50 mg nightly for several years.  Wonders if it may be possible to come off the Zoloft.  She does endorse trying this before and notes that her life brought her the medication and told her she needed to start back due to irritability and mood swings.  When she tried to stop the medication at that time, she discontinued it abruptly without a taper.  I reviewed the past medical history, family history, social history, surgical history, and allergies today and no changes were needed.  Please see the problem list section below in epic for further details.  Past Medical History: Past Medical History:  Diagnosis Date  . Acute respiratory failure ([place]   . Diabetes mellitus without complication ([place]   . GERD (gastroesophageal reflux disease)   . Hypertension   . Menopausal vasomotor syndrome   . Obesity   . Pre-diabetes   . RSV (respiratory syncytial virus pneumonia)    Past Surgical History: Past Surgical History:  Procedure Laterality Date  . ANKLE ARTHROSCOPY Left 1985  . DILATION AND CURETTAGE OF UTERUS N/A 08/14/2017   Procedure: DILATATION AND CURETTAGE;  Surgeon: DEmily Filbert MD;  [place]  Service: Gynecology;  Laterality:  N/A;   Social History: Social History   Socioeconomic History  . Marital status: Married    Spouse name: Not on file  . Number of children: 4  . Years of education: Not on file  . Highest education level: Not on file  Occupational History  . Not on file  Tobacco Use  . Smoking status: Never Smoker  . Smokeless tobacco: Never Used  Vaping Use  . Vaping Use: Never used  Substance and Sexual Activity  . Alcohol use: Not Currently  . Drug use: Never  . Sexual activity: Yes    Birth control/protection: Post-menopausal  Other Topics Concern  . Not on file  Social History Narrative   Lives at home with wife and teenage daughter   Social Determinants of Health   Financial Resource Strain:   . Difficulty of Paying Living Expenses: Not on file  Food Insecurity:   . Worried About RCharity fundraiserin the Last Year: Not on file  . Ran Out of Food in the Last Year: Not on file  Transportation Needs:   . Lack of Transportation (Medical): Not on file  . Lack of Transportation (Non-Medical): Not on file  Physical Activity:   . Days of Exercise per Week: Not on file  . Minutes of Exercise per Session: Not on file  Stress:   . Feeling of Stress : Not on file  Social Connections:   . Frequency of Communication with Friends and Family: Not on file  . Frequency of Social Gatherings with Friends and Family:  Not on file  . Attends Religious Services: Not on file  . Active Member of Clubs or Organizations: Not on file  . Attends Archivist Meetings: Not on file  . Marital Status: Not on file   Family History: Family History  Problem Relation Age of Onset  . Heart disease Mother   . Hypertension Mother   . Heart disease Father   . Breast cancer Maternal Grandmother    Allergies: No Known Allergies Medications: See med rec.  Review of Systems: See HPI for pertinent positives and negatives.   Objective:    General: Well Developed, well nourished, and in no acute  distress.  Neuro: Alert and oriented x3.  HEENT: Normocephalic, atraumatic.  Skin: Warm and dry. Cardiac: Regular rate and rhythm, no murmurs rubs or gallops, no lower extremity edema.  Respiratory: Clear to auscultation bilaterally. Not using accessory muscles, speaking in full sentences.  Impression and Recommendations:    1. Class 2 obesity with serious comorbidity 6lb weight loss despite the difficulty with her medication. Continue Wegovy 0.18m/0.5ml SQ weekly. Continue lifestyle modifications.  2. Major depressive disorder with single episode, in full remission ([place] Refilling sertraline. Discussed discontinuation of SSRIs in response to patient question. May plan to work on coming off the Zoloft in the near future.  - sertraline (ZOLOFT) 50 MG tablet; Take 1 tablet (50 mg total) by mouth at bedtime.  Dispense: 90 tablet; Refill: 1    ___________________________________________ JClearnce Sorrel DNP, APRN, FNP-BC Primary Care and Sports Medicine [place]

## 2019-11-14 NOTE — Telephone Encounter (Signed)
Received fax and they denied medication due to it does not appear to meet medically necessary requirements. - CF

## 2019-11-18 ENCOUNTER — Other Ambulatory Visit: Payer: Self-pay | Admitting: Medical-Surgical

## 2019-11-18 MED ORDER — WEGOVY 1 MG/0.5ML ~~LOC~~ SOAJ: 1.0000 mg | SUBCUTANEOUS | 0 refills | Status: DC

## 2019-11-23 ENCOUNTER — Other Ambulatory Visit: Payer: Self-pay | Admitting: Medical-Surgical

## 2019-11-23 DIAGNOSIS — I10 Essential (primary) hypertension: Secondary | ICD-10-CM

## 2019-12-18 ENCOUNTER — Other Ambulatory Visit: Payer: Self-pay | Admitting: Medical-Surgical

## 2019-12-18 ENCOUNTER — Encounter: Payer: Self-pay | Admitting: Medical-Surgical

## 2019-12-18 DIAGNOSIS — K219 Gastro-esophageal reflux disease without esophagitis: Secondary | ICD-10-CM

## 2019-12-19 MED ORDER — WEGOVY 1 MG/0.5ML ~~LOC~~ SOAJ
1.0000 mg | SUBCUTANEOUS | 0 refills | Status: DC
Start: 2019-12-19 — End: 2019-12-23

## 2019-12-23 ENCOUNTER — Ambulatory Visit (INDEPENDENT_AMBULATORY_CARE_PROVIDER_SITE_OTHER): Payer: 59 | Admitting: Medical-Surgical

## 2019-12-23 ENCOUNTER — Encounter: Payer: Self-pay | Admitting: Medical-Surgical

## 2019-12-23 ENCOUNTER — Other Ambulatory Visit: Payer: Self-pay

## 2019-12-23 VITALS — BP 128/85 | HR 98 | Temp 98.0°F | Ht 64.0 in | Wt 209.1 lb

## 2019-12-23 DIAGNOSIS — Z6835 Body mass index (BMI) 35.0-35.9, adult: Secondary | ICD-10-CM

## 2019-12-23 MED ORDER — SEMAGLUTIDE-WEIGHT MANAGEMENT 1.7 MG/0.75ML ~~LOC~~ SOAJ: 1.7000 mg | SUBCUTANEOUS | 0 refills | Status: DC

## 2019-12-23 NOTE — Progress Notes (Signed)
Subjective:    CC: weight check  HPI: Pleasant 59 year old female accompanied by her wife, Loma Boston, presenting for weight check on Wegovy. She had her knee surgery almost 6 weeks ago and has been recovering well. She is doing PT twice weekly and has home exercises that she tries to do each day. She is trying to stay active by walking but is limited by pain. She notes her appetite has been iffy since surgery, coming and going. She notes that overeating will cause her to vomit and has been limiting portions to prevent this. She admits that there is room for improvement in her dietary choices but her taste has been off. Will often think of something that sounds good to eat but after the first couple of bites, she doesn't want it anymore. Denies fever, chills, shortness of breath, chest pain, nausea, and diarrhea/constipation.  I reviewed the past medical history, family history, social history, surgical history, and allergies today and no changes were needed.  Please see the problem list section below in epic for further details.  Past Medical History: Past Medical History:  Diagnosis Date  . Acute respiratory failure ([place])   . Diabetes mellitus without complication ([place])   . GERD (gastroesophageal reflux disease)   . Hypertension   . Menopausal vasomotor syndrome   . Obesity   . Pre-diabetes   . RSV (respiratory syncytial virus pneumonia)    Past Surgical History: Past Surgical History:  Procedure Laterality Date  . ANKLE ARTHROSCOPY Left 1985  . DILATION AND CURETTAGE OF UTERUS N/A 08/14/2017   Procedure: DILATATION AND CURETTAGE;  Surgeon: Emily Filbert, MD;  Location: [facility];  Service: Gynecology;  Laterality: N/A;   Social History: Social History   Socioeconomic History  . Marital status: Married    Spouse name: Not on file  . Number of children: 4  . Years of education: Not on file  . Highest education level: Not on file  Occupational History  . Not on file   Tobacco Use  . Smoking status: Never Smoker  . Smokeless tobacco: Never Used  Vaping Use  . Vaping Use: Never used  Substance and Sexual Activity  . Alcohol use: Not Currently  . Drug use: Never  . Sexual activity: Yes    Birth control/protection: Post-menopausal  Other Topics Concern  . Not on file  Social History Narrative   Lives at home with wife and teenage daughter   Social Determinants of Health   Financial Resource Strain:   . Difficulty of Paying Living Expenses: Not on file  Food Insecurity:   . Worried About Charity fundraiser in the Last Year: Not on file  . Ran Out of Food in the Last Year: Not on file  Transportation Needs:   . Lack of Transportation (Medical): Not on file  . Lack of Transportation (Non-Medical): Not on file  Physical Activity:   . Days of Exercise per Week: Not on file  . Minutes of Exercise per Session: Not on file  Stress:   . Feeling of Stress : Not on file  Social Connections:   . Frequency of Communication with Friends and Family: Not on file  . Frequency of Social Gatherings with Friends and Family: Not on file  . Attends Religious Services: Not on file  . Active Member of Clubs or Organizations: Not on file  . Attends Archivist Meetings: Not on file  . Marital Status: Not on file   Family History: Family  History  Problem Relation Age of Onset  . Heart disease Mother   . Hypertension Mother   . Heart disease Father   . Breast cancer Maternal Grandmother    Allergies: No Known Allergies Medications: See med rec.  Review of Systems: See HPI for pertinent positives and negatives.   Objective:    General: Well Developed, well nourished, and in no acute distress.  Neuro: Alert and oriented x3.  HEENT: Normocephalic, atraumatic.  Skin: Warm and dry. Cardiac: Regular rate and rhythm, no murmurs rubs or gallops, no lower extremity edema.  Respiratory: Clear to auscultation bilaterally. Not using accessory muscles,  speaking in full sentences.   Impression and Recommendations:    1. Class 2 severe obesity due to excess calories with serious comorbidity and body mass index (BMI) of 35.0 to 35.9 in adult [facility]) Unfortunately, a refill of Wegovy at the 6m per week dose was already sent for her so we are unable to jump to the next dose of 1.743mweekly at this time. She has the 24m88mose at home and has already opened the box. She is okay with using what she has now. Sending in the next dose of 1.7mg73mekly for 4 weeks. Continue exercise as tolerated. Work on makiGovernment social research officerntinue portion control.   Return in about 4 weeks (around 01/20/2020) for weight check. ___________________________________________ Shadawn Hanaway Clearnce SorrelP, APRN, FNP-BC Primary Care and SporLake Clarke Shores

## 2020-01-19 NOTE — Progress Notes (Signed)
Subjective:    CC: Weight check  HPI: Pleasant 59 year old female presenting today for weight check on Wegovy. She has completed a second month of the 23m dose and done very well with it. She is tolerating the medication well without side effects. Continues to make healthy dietary choices and limit her portion sizes. Walking daily 1-3 times for 15 minutes each depending on how her knee feels. Continues to do PT for her total knee replacement recovery. Notes her goal weight ideally would be 160 but it would be nice to be at a normal weight for her height.   I reviewed the past medical history, family history, social history, surgical history, and allergies today and no changes were needed.  Please see the problem list section below in epic for further details.  Past Medical History: Past Medical History:  Diagnosis Date  . Acute respiratory failure ([facility]   . [place] mellitus without complication ([facility]   . GERD (gastroesophageal reflux disease)   . Hypertension   . Menopausal vasomotor syndrome   . Obesity   . Pre-diabetes   . RSV (respiratory syncytial virus pneumonia)    Past Surgical History: Past Surgical History:  Procedure Laterality Date  . ANKLE ARTHROSCOPY Left 1985  . DILATION AND CURETTAGE OF UTERUS N/A 08/14/2017   Procedure: DILATATION AND CURETTAGE;  Surgeon: DEmily Filbert MD;  [facility]  Service: Gynecology;  Laterality: N/A;   Social History: Social History   Socioeconomic History  . Marital status: Married    Spouse name: Not on file  . Number of children: 4  . Years of education: Not on file  . Highest education level: Not on file  Occupational History  . Not on file  Tobacco Use  . Smoking status: Never Smoker  . Smokeless tobacco: Never Used  Vaping Use  . Vaping Use: Never used  Substance and Sexual Activity  . Alcohol use: Not Currently  . Drug use: Never  . Sexual activity: Yes    Birth control/protection:  Post-menopausal  Other Topics Concern  . Not on file  Social History Narrative   Lives at home with wife and teenage daughter   Social Determinants of Health   Financial Resource Strain:   . Difficulty of Paying Living Expenses: Not on file  Food Insecurity:   . Worried About RCharity fundraiserin the Last Year: Not on file  . Ran Out of Food in the Last Year: Not on file  Transportation Needs:   . Lack of Transportation (Medical): Not on file  . Lack of Transportation (Non-Medical): Not on file  Physical Activity:   . Days of Exercise per Week: Not on file  . Minutes of Exercise per Session: Not on file  Stress:   . Feeling of Stress : Not on file  Social Connections:   . Frequency of Communication with Friends and Family: Not on file  . Frequency of Social Gatherings with Friends and Family: Not on file  . Attends Religious Services: Not on file  . Active Member of Clubs or Organizations: Not on file  . Attends CArchivistMeetings: Not on file  . Marital Status: Not on file   Family History: Family History  Problem Relation Age of Onset  . Heart disease Mother   . Hypertension Mother   . Heart disease Father   . Breast cancer Maternal Grandmother    Allergies: No Known Allergies Medications: See med rec.  Review of Systems:  See HPI for pertinent positives and negatives.   Objective:    General: Well Developed, well nourished, and in no acute distress.  Neuro: Alert and oriented x3.  HEENT: Normocephalic, atraumatic.  Skin: Warm and dry. Cardiac: Regular rate and rhythm, no murmurs rubs or gallops, no lower extremity edema.  Respiratory: Clear to auscultation bilaterally. Not using accessory muscles, speaking in full sentences.  Impression and Recommendations:    1. Encounter for weight management 4 lb weight loss in the last month, 28 lbs lost since April. Increasing Wegovy to 1.29m weekly x 4 to start today. Continue diet and exercise.   Return  in about 4 weeks (around 02/17/2020) for weight check. __________________________________________ JClearnce Sorrel DNP, APRN, FNP-BC Primary [facility] and [facility]

## 2020-01-20 ENCOUNTER — Other Ambulatory Visit: Payer: Self-pay

## 2020-01-20 ENCOUNTER — Encounter: Payer: Self-pay | Admitting: Medical-Surgical

## 2020-01-20 ENCOUNTER — Ambulatory Visit (INDEPENDENT_AMBULATORY_CARE_PROVIDER_SITE_OTHER): Payer: 59 | Admitting: Medical-Surgical

## 2020-01-20 VITALS — BP 103/70 | HR 88 | Temp 98.2°F | Ht 64.0 in | Wt 205.0 lb

## 2020-01-20 DIAGNOSIS — Z7689 Persons encountering health services in other specified circumstances: Secondary | ICD-10-CM

## 2020-02-13 ENCOUNTER — Encounter: Payer: Self-pay | Admitting: Medical-Surgical

## 2020-02-13 MED ORDER — SEMAGLUTIDE-WEIGHT MANAGEMENT 2.4 MG/0.75ML ~~LOC~~ SOAJ: 2.4000 mg | SUBCUTANEOUS | 1 refills | Status: DC

## 2020-02-16 MED ORDER — SEMAGLUTIDE-WEIGHT MANAGEMENT 1.7 MG/0.75ML ~~LOC~~ SOAJ: 1.7000 mg | SUBCUTANEOUS | 0 refills | Status: DC

## 2020-02-16 NOTE — Addendum Note (Signed)
Addended bySamuel Bouche on: 02/16/2020 12:44 PM   Modules accepted: Orders

## 2020-02-20 ENCOUNTER — Other Ambulatory Visit: Payer: Self-pay

## 2020-02-20 ENCOUNTER — Ambulatory Visit (INDEPENDENT_AMBULATORY_CARE_PROVIDER_SITE_OTHER): Payer: 59 | Admitting: Medical-Surgical

## 2020-02-20 ENCOUNTER — Encounter: Payer: Self-pay | Admitting: Medical-Surgical

## 2020-02-20 VITALS — BP 119/80 | HR 87 | Ht 64.0 in | Wt 202.4 lb

## 2020-02-20 DIAGNOSIS — Z7689 Persons encountering health services in other specified circumstances: Secondary | ICD-10-CM

## 2020-02-20 DIAGNOSIS — Z1211 Encounter for screening for malignant neoplasm of colon: Secondary | ICD-10-CM | POA: Diagnosis not present

## 2020-02-20 MED ORDER — SEMAGLUTIDE-WEIGHT MANAGEMENT 2.4 MG/0.75ML ~~LOC~~ SOAJ: 2.4000 mg | SUBCUTANEOUS | 0 refills | Status: DC

## 2020-02-20 NOTE — Progress Notes (Signed)
Subjective:    CC: weight check  HPI: Pleasant 60 year old female presenting for weight check on Wegovy. Has been taking the 1.70m dose weekly and tolerating well. Was unable to get the 2.478mweekly dose due to backorder so we continued the 1.3m65mose for another 4 weeks. No side effects such as nausea or constipation noted.  Notes her appetite has been suppressed but some days she does eat more than others.  Is still working on increasing activity and usually walks for exercise.  Notes this is her last month [facility] is interested in other weight loss options.  I reviewed the past medical history, family history, social history, surgical history, and allergies today and no changes were needed.  Please see the problem list section below in epic for further details.  Past Medical History: Past Medical History:  Diagnosis Date  . [facility] failure ([facility] . [facility] [place] without complication ([facility] . GERD (gastroesophageal reflux disease)   . Hypertension   . Menopausal vasomotor syndrome   . Obesity   . Pre-diabetes   . RSV (respiratory syncytial virus pneumonia)    Past Surgical History: Past Surgical History:  Procedure Laterality Date  . ANKLE ARTHROSCOPY Left 1985  . DILATION AND CURETTAGE OF UTERUS N/A 08/14/2017   Procedure: DILATATION AND CURETTAGE;  Surgeon: DovEmily FilbertD;  [facility];  Laterality: N/A;   Social History: Social History   Socioeconomic History  . Marital status: Married    Spouse name: Not on file  . Number of children: 4  . Years of education: Not on file  . Highest education level: Not on file  Occupational History  . Not on file  Tobacco Use  . Smoking status: Never Smoker  . Smokeless tobacco: Never Used  Vaping Use  . Vaping Use: Never used  Substance and Sexual Activity  . Alcohol use: Not Currently  . Drug use: Never  . Sexual activity: Yes    Birth  control/protection: Post-menopausal  Other Topics Concern  . Not on file  Social History Narrative   Lives at home with wife and teenage daughter   Social Determinants of Health   Financial Resource Strain: Not on file  Food Insecurity: Not on file  Transportation Needs: Not on file  Physical Activity: Not on file  Stress: Not on file  Social Connections: Not on file   Family History: Family History  Problem Relation Age of Onset  . Heart disease Mother   . Hypertension Mother   . Heart disease Father   . Breast cancer Maternal Grandmother    Allergies: No Known Allergies Medications: See med rec.  Review of Systems: See HPI for pertinent positives and negatives.   Objective:    General: Well Developed, well nourished, and in no acute distress.  Neuro: Alert and oriented x3.  HEENT: Normocephalic, atraumatic.  Skin: Warm and dry. Cardiac: Regular rate and rhythm, no murmurs rubs or gallops, no lower extremity edema.  Respiratory: Clear to auscultation bilaterally. Not using accessory muscles, speaking in full sentences.  Impression and Recommendations:    1. Encounter for weight management Another 3 pounds lost since her last visit.  Continue Wegovy 1.7 mg weekly for the next 3 weeks.  Sending 2.4 mg dose and to pharmacy with instructions to run on patient's insurance.  Prior Auth needed, requested information be sent to our office ASAP so we can get that  process started.  Discussed other weight loss medication options if insurance does not approve Wegovy coverage.  2. Colon cancer screening Referring to GI for colonoscopy per patient request. - Ambulatory referral to Gastroenterology  Return in about 4 weeks (around 03/19/2020) for weight check. ___________________________________________ Clearnce Sorrel, DNP, APRN, FNP-BC Primary Care and [place]

## 2020-02-23 ENCOUNTER — Encounter: Payer: Self-pay | Admitting: Medical-Surgical

## 2020-02-27 ENCOUNTER — Encounter: Payer: Self-pay | Admitting: Gastroenterology

## 2020-02-28 ENCOUNTER — Telehealth: Payer: Self-pay | Admitting: Neurology

## 2020-02-28 NOTE — Telephone Encounter (Signed)
[facility] PA approved.

## 2020-03-17 ENCOUNTER — Other Ambulatory Visit: Payer: Self-pay | Admitting: Medical-Surgical

## 2020-03-17 DIAGNOSIS — I10 Essential (primary) hypertension: Secondary | ICD-10-CM

## 2020-03-19 ENCOUNTER — Encounter: Payer: Self-pay | Admitting: Medical-Surgical

## 2020-03-19 ENCOUNTER — Other Ambulatory Visit: Payer: Self-pay

## 2020-03-19 ENCOUNTER — Ambulatory Visit (INDEPENDENT_AMBULATORY_CARE_PROVIDER_SITE_OTHER): Payer: 59 | Admitting: Medical-Surgical

## 2020-03-19 VITALS — BP 113/75 | HR 105 | Temp 97.8°F | Ht 64.0 in | Wt 198.9 lb

## 2020-03-19 DIAGNOSIS — I10 Essential (primary) hypertension: Secondary | ICD-10-CM

## 2020-03-19 DIAGNOSIS — Z7689 Persons encountering health services in other specified circumstances: Secondary | ICD-10-CM | POA: Diagnosis not present

## 2020-03-19 DIAGNOSIS — F325 Major depressive disorder, single episode, in full remission: Secondary | ICD-10-CM | POA: Diagnosis not present

## 2020-03-19 MED ORDER — SEMAGLUTIDE-WEIGHT MANAGEMENT 2.4 MG/0.75ML ~~LOC~~ SOAJ: 2.4000 mg | SUBCUTANEOUS | 2 refills | Status: DC

## 2020-03-19 NOTE — Progress Notes (Signed)
Subjective:    CC: weight check  HPI: Pleasant 60 year old female presenting today for weight check on Wegovy. Has been doing well on the medication. Last month we decreased the dose to 1.67m due to availability issues. She noted an increase in her appetite with the lower dose.  Notes this month was breath, especially with the winter weather.  Her activity has been limited as she has not been able to walk as frequently.  Denies constipation, nausea, or other side effects.  Hypertension-taking hydrochlorothiazide 25 mg daily.  Blood pressure is well controlled today.  She is tolerating the medication well without side effects. Denies CP, SOB, palpitations, lower extremity edema, dizziness, headaches, or vision changes.  Mood-taking sertraline 50 mg daily, tolerating well.  She has been on this medication for quite a while and wonders if she even needs it.  She started this while she was going through menopause and having difficulties with anxiety/depression.  Feels that her mood is very stable at this point and would like to try tapering off the medication.  She did stop it 1 time before but did so abruptly and did not adjust well at all.  I reviewed the past medical history, family history, social history, surgical history, and allergies today and no changes were needed.  Please see the problem list section below in epic for further details.  Past Medical History: Past Medical History:  Diagnosis Date  . Acute respiratory failure ([place]   . Diabetes mellitus without complication ([place]   . GERD (gastroesophageal reflux disease)   . Hypertension   . Menopausal vasomotor syndrome   . Obesity   . Pre-diabetes   . RSV (respiratory syncytial virus pneumonia)    Past Surgical History: Past Surgical History:  Procedure Laterality Date  . ANKLE ARTHROSCOPY Left 1985  . DILATION AND CURETTAGE OF UTERUS N/A 08/14/2017   Procedure: DILATATION AND CURETTAGE;  Surgeon: DEmily Filbert MD;  [place]  [facility]  Service: Gynecology;  Laterality: N/A;   Social History: Social History   Socioeconomic History  . Marital status: Married    Spouse name: Not on file  . Number of children: 4  . Years of education: Not on file  . Highest education level: Not on file  Occupational History  . Not on file  Tobacco Use  . Smoking status: Never Smoker  . Smokeless tobacco: Never Used  Vaping Use  . Vaping Use: Never used  Substance and Sexual Activity  . Alcohol use: Not Currently  . Drug use: Never  . Sexual activity: Yes    Birth control/protection: Post-menopausal  Other Topics Concern  . Not on file  Social History Narrative   Lives at home with wife and teenage daughter   Social Determinants of Health   Financial Resource Strain: Not on file  Food Insecurity: Not on file  Transportation Needs: Not on file  Physical Activity: Not on file  Stress: Not on file  Social Connections: Not on file   Family History: Family History  Problem Relation Age of Onset  . Heart disease Mother   . Hypertension Mother   . Heart disease Father   . Breast cancer Maternal Grandmother    Allergies: No Known Allergies Medications: See med rec.  Review of Systems: See HPI for pertinent positives and negatives.   Objective:    General: Well Developed, well nourished, and in no acute distress.  Neuro: Alert and oriented x3.  HEENT: Normocephalic, atraumatic.  Skin: Warm and  dry. Cardiac: Regular rate and rhythm, no murmurs rubs or gallops, no lower extremity edema.  Respiratory: Clear to auscultation bilaterally. Not using accessory muscles, speaking in full sentences.   Impression and Recommendations:    1. Encounter for weight management Continues to do well on Wegovy with approximately 4 pounds weight loss in the last 4 weeks despite the decreased dose and activity.  Increase dose to 2.4 mg weekly.  She is gotten this approved through insurance so we will be able  to continue this without further difficulty.  2. Essential hypertension Continue hydrochlorothiazide at 25 mg daily.  Blood pressure well controlled 113/75.  3. Major depressive disorder with single episode, in full remission ([place]) Discussed tapering off Zoloft since she has been on it for such a long period of time.  Reviewed and appropriate taper to prevent resurgence of depressive symptoms.  Patient will decide if she would like to taper Zoloft and start that on her own.  She will let me know if there are any issues with the taper.  Return in about 2 months (around 05/17/2020) for weight check. ___________________________________________ Clearnce Sorrel, DNP, APRN, FNP-BC Primary Care and [place]

## 2020-03-22 ENCOUNTER — Telehealth: Payer: Self-pay

## 2020-03-22 NOTE — Telephone Encounter (Signed)
PA for Wegovy 2.4 mg/0.75 ml entered through [facility]

## 2020-03-26 ENCOUNTER — Other Ambulatory Visit: Payer: Self-pay

## 2020-03-26 ENCOUNTER — Ambulatory Visit (AMBULATORY_SURGERY_CENTER): Payer: Self-pay | Admitting: *Deleted

## 2020-03-26 VITALS — Ht 64.0 in | Wt 202.0 lb

## 2020-03-26 DIAGNOSIS — Z1211 Encounter for screening for malignant neoplasm of colon: Secondary | ICD-10-CM

## 2020-03-26 MED ORDER — PLENVU 140 G PO SOLR: 1.0000 | Freq: Once | ORAL | 0 refills | Status: AC

## 2020-03-26 NOTE — Progress Notes (Signed)
Patient is here in-person for PV. Patient denies any allergies to eggs or soy. Patient denies any problems with anesthesia/sedation. Patient denies any oxygen use at home. Patient denies taking any diet/weight loss medications or blood thinners. Patient is not being treated for MRSA or C-diff. Patient is aware of our care-partner policy and LHTDS-28 safety protocol. EMMI education assigned to the patient for the procedure, sent to [facility].   COVID-19 vaccines completed x3, per patient.   Prep Prescription coupon given to the patient.

## 2020-03-29 ENCOUNTER — Other Ambulatory Visit: Payer: Self-pay | Admitting: Medical-Surgical

## 2020-03-30 ENCOUNTER — Other Ambulatory Visit: Payer: Self-pay | Admitting: Medical-Surgical

## 2020-03-30 DIAGNOSIS — Z1231 Encounter for screening mammogram for malignant neoplasm of breast: Secondary | ICD-10-CM

## 2020-04-05 ENCOUNTER — Other Ambulatory Visit: Payer: Self-pay

## 2020-04-05 ENCOUNTER — Ambulatory Visit (INDEPENDENT_AMBULATORY_CARE_PROVIDER_SITE_OTHER): Payer: 59

## 2020-04-05 DIAGNOSIS — Z1231 Encounter for screening mammogram for malignant neoplasm of breast: Secondary | ICD-10-CM | POA: Diagnosis not present

## 2020-04-06 ENCOUNTER — Encounter: Payer: Self-pay | Admitting: Gastroenterology

## 2020-04-10 ENCOUNTER — Ambulatory Visit (AMBULATORY_SURGERY_CENTER): Payer: 59 | Admitting: Gastroenterology

## 2020-04-10 ENCOUNTER — Other Ambulatory Visit: Payer: Self-pay

## 2020-04-10 ENCOUNTER — Encounter: Payer: Self-pay | Admitting: Gastroenterology

## 2020-04-10 VITALS — BP 109/65 | HR 80 | Temp 97.5°F | Resp 15 | Ht 64.0 in | Wt 202.0 lb

## 2020-04-10 DIAGNOSIS — D122 Benign neoplasm of ascending colon: Secondary | ICD-10-CM

## 2020-04-10 DIAGNOSIS — Z1211 Encounter for screening for malignant neoplasm of colon: Secondary | ICD-10-CM

## 2020-04-10 DIAGNOSIS — K5000 Crohn's disease of small intestine without complications: Secondary | ICD-10-CM | POA: Diagnosis not present

## 2020-04-10 MED ORDER — SODIUM CHLORIDE 0.9 % IV SOLN: 500.0000 mL | Freq: Once | INTRAVENOUS | Status: DC

## 2020-04-10 NOTE — Progress Notes (Signed)
Called to room to assist during endoscopic procedure.  Patient ID and intended procedure confirmed with present staff. Received instructions for my participation in the procedure from the performing physician.  

## 2020-04-10 NOTE — Op Note (Signed)
[facility] Patient Name: Megan [name] Procedure Date: 04/10/2020 8:01 AM MRN: [id number] Endoscopist: Jackquline Denmark , MD Age: 60 Referring MD:  Date of Birth: 03-Dec-1960 Gender: Female Account #: [id number] Procedure:                Colonoscopy Indications:              Screening for colorectal malignant neoplasm Medicines:                Monitored Anesthesia Care Procedure:                Pre-Anesthesia Assessment:                           - Prior to the procedure, a History and Physical                            was performed, and patient medications and                            allergies were reviewed. The patient's tolerance of                            previous anesthesia was also reviewed. The risks                            and benefits of the procedure and the sedation                            options and risks were discussed with the patient.                            All questions were answered, and informed consent                            was obtained. Prior Anticoagulants: The patient has                            taken no previous anticoagulant or antiplatelet                            agents. ASA Grade Assessment: II - A patient with                            mild systemic disease. After reviewing the risks                            and benefits, the patient was deemed in                            satisfactory condition to undergo the procedure.                           After obtaining informed consent, the colonoscope  was passed under direct vision. Throughout the                            procedure, the patient's blood pressure, pulse, and                            oxygen saturations were monitored continuously. The                            Olympus CF-HQ190L [id number]) Colonoscope was                            introduced through the anus and advanced to the 5                            cm into the ileum. The  colonoscopy was performed                            without difficulty. The patient tolerated the                            procedure well. The quality of the bowel                            preparation was good. The terminal ileum, ileocecal                            valve, appendiceal orifice, and rectum were                            photographed. Scope In: 8:17:43 AM Scope Out: 8:37:05 AM Scope Withdrawal Time: 0 hours 11 minutes 49 seconds  Total Procedure Duration: 0 hours 19 minutes 22 seconds  Findings:                 Two sessile polyps were found in the proximal                            ascending colon and mid ascending colon. The polyps                            were 2 to 4 mm in size. These polyps were removed                            with a cold biopsy forceps. Resection and retrieval                            were complete.                           Multiple medium-mouthed diverticula were found in                            the sigmoid colon, few in descending colon and  ascending colon.                           Non-bleeding internal hemorrhoids were found during                            retroflexion. The hemorrhoids were small. No                            perianal Crohn's disease.                           The terminal ileum contained multiple localized                            non-bleeding aphthous erosions with terminal ileal                            stricture, luminal diameter 12 mm (did allow                            passage of CF H1 80). No stigmata of recent                            bleeding were seen. Biopsies were taken with a cold                            forceps for histology.                           The exam was otherwise without abnormality on                            direct and retroflexion views. Complications:            No immediate complications. Estimated Blood Loss:     Estimated blood loss:  none. Impression:               - Two 2 to 4 mm polyps in the proximal ascending                            colon and in the mid ascending colon, removed with                            a cold biopsy forceps. Resected and retrieved.                           - Moderate predominantly sigmoid diverticulosis.                           - Terminal ileitis with terminal ileal                            stricture-highly suggestive of Crohn's disease.                           -  The examination was otherwise normal on direct                            and retroflexion views. Recommendation:           - Patient has a contact number available for                            emergencies. The signs and symptoms of potential                            delayed complications were discussed with the                            patient. Return to normal activities tomorrow.                            Written discharge instructions were provided to the                            patient.                           - Resume previous diet.                           - Continue present medications.                           - Await pathology results.                           - Repeat colonoscopy for surveillance based on                            pathology results.                           - Return to GI clinic in 12 weeks. At follow-up,                            check labs including B12, CRP.                           - The findings and recommendations were discussed                            with the patient's wife Otila Kluver. Jackquline Denmark, MD 04/10/2020 8:46:14 AM This report has been signed electronically.

## 2020-04-10 NOTE — Patient Instructions (Signed)
Handouts given:  Diverticulosis, hemorrhoids, polyps Continue current medications Resume previous diet Await pathology results Return to [facility] clinic in 12 weeks  YOU HAD AN ENDOSCOPIC PROCEDURE TODAY AT [facility]:   Refer to the procedure report that was given to you for any specific questions about what was found during the examination.  If the procedure report does not answer your questions, please call your gastroenterologist to clarify.  If you requested that your care partner not be given the details of your procedure findings, then the procedure report has been included in a sealed envelope for you to review at your convenience later.  YOU SHOULD EXPECT: Some feelings of bloating in the abdomen. Passage of more gas than usual.  Walking can help get rid of the air that was put into your GI tract during the procedure and reduce the bloating. If you had a lower endoscopy (such as a colonoscopy or flexible sigmoidoscopy) you may notice spotting of blood in your stool or on the toilet paper. If you underwent a bowel prep for your procedure, you may not have a normal bowel movement for a few days.  Please Note:  You might notice some irritation and congestion in your nose or some drainage.  This is from the oxygen used during your procedure.  There is no need for concern and it should clear up in a day or so.  SYMPTOMS TO REPORT IMMEDIATELY:   Following lower endoscopy (colonoscopy or flexible sigmoidoscopy):  Excessive amounts of blood in the stool  Significant tenderness or worsening of abdominal pains  Swelling of the abdomen that is new, acute  Fever of 100F or higher  For urgent or emergent issues, a gastroenterologist can be reached at any hour by calling [phone]. Do not use MyChart messaging for urgent concerns.   DIET:  We do recommend a small meal at first, but then you may proceed to your regular diet.  Drink plenty of fluids but you should avoid alcoholic  beverages for 24 hours.  ACTIVITY:  You should plan to take it easy for the rest of today and you should NOT DRIVE or use heavy machinery until tomorrow (because of the sedation medicines used during the test).    FOLLOW UP: Our staff will call the number listed on your records 48-72 hours following your procedure to check on you and address any questions or concerns that you may have regarding the information given to you following your procedure. If we do not reach you, we will leave a message.  We will attempt to reach you two times.  During this call, we will ask if you have developed any symptoms of COVID 19. If you develop any symptoms (ie: fever, flu-like symptoms, shortness of breath, cough etc.) before then, please call [phone].  If you test positive for Covid 19 in the 2 weeks post procedure, please call and report this information to [place].    If any biopsies were taken you will be contacted by phone or by letter within the next 1-3 weeks.  Please call us at [phone] if you have not heard about the biopsies in 3 weeks.   SIGNATURES/CONFIDENTIALITY: You and/or your care partner have signed paperwork which will be entered into your electronic medical record.  These signatures attest to the fact that that the information above on your After Visit Summary has been reviewed and is understood.  Full responsibility of the confidentiality of this discharge information lies with you and/or  your care-partner.

## 2020-04-10 NOTE — Progress Notes (Signed)
pt tolerated well. VSS. awake and to recovery. Report given to RN.  

## 2020-04-10 NOTE — Progress Notes (Signed)
VS-SH  Pt's states no medical or surgical changes since previsit or office visit.

## 2020-04-12 ENCOUNTER — Telehealth: Payer: Self-pay

## 2020-04-12 NOTE — Telephone Encounter (Signed)
  Follow up Call-  Call back number 04/10/2020  Post procedure Call Back phone  # [phone]  Permission to leave phone message Yes  Some recent data might be hidden     Patient questions:  Do you have a fever, pain , or abdominal swelling? No. Pain Score  0 *  Have you tolerated food without any problems? Yes.    Have you been able to return to your normal activities? Yes.    Do you have any questions about your discharge instructions: Diet   No. Medications  No. Follow up visit  No.  Do you have questions or concerns about your Care? No.  Actions: * If pain score is 4 or above: No action needed, pain <4. 1. Have you developed a fever since your procedure? no  2.   Have you had an respiratory symptoms (SOB or cough) since your procedure? no  3.   Have you tested positive for COVID 19 since your procedure no  4.   Have you had any family members/close contacts diagnosed with the COVID 19 since your procedure?  no   If yes to any of these questions please route to Joylene John, RN and Joella Prince, RN

## 2020-05-12 ENCOUNTER — Encounter: Payer: Self-pay | Admitting: Gastroenterology

## 2020-05-12 NOTE — Progress Notes (Signed)
Can you please work her into my clinic week of April 4th or 11 th  RG

## 2020-05-15 ENCOUNTER — Other Ambulatory Visit: Payer: Self-pay

## 2020-05-15 DIAGNOSIS — F325 Major depressive disorder, single episode, in full remission: Secondary | ICD-10-CM

## 2020-05-15 MED ORDER — SERTRALINE HCL 50 MG PO TABS: 50.0000 mg | ORAL_TABLET | Freq: Every day | ORAL | 0 refills | Status: DC

## 2020-05-16 ENCOUNTER — Encounter: Payer: Self-pay | Admitting: Medical-Surgical

## 2020-05-16 ENCOUNTER — Ambulatory Visit (INDEPENDENT_AMBULATORY_CARE_PROVIDER_SITE_OTHER): Payer: 59

## 2020-05-16 ENCOUNTER — Other Ambulatory Visit: Payer: Self-pay

## 2020-05-16 ENCOUNTER — Ambulatory Visit (INDEPENDENT_AMBULATORY_CARE_PROVIDER_SITE_OTHER): Payer: 59 | Admitting: Medical-Surgical

## 2020-05-16 VITALS — BP 107/71 | HR 109 | Temp 98.4°F | Ht 64.0 in | Wt 196.0 lb

## 2020-05-16 DIAGNOSIS — Z6834 Body mass index (BMI) 34.0-34.9, adult: Secondary | ICD-10-CM

## 2020-05-16 DIAGNOSIS — E6609 Other obesity due to excess calories: Secondary | ICD-10-CM

## 2020-05-16 DIAGNOSIS — M25551 Pain in right hip: Secondary | ICD-10-CM

## 2020-05-16 DIAGNOSIS — R1032 Left lower quadrant pain: Secondary | ICD-10-CM | POA: Diagnosis not present

## 2020-05-16 DIAGNOSIS — G8929 Other chronic pain: Secondary | ICD-10-CM

## 2020-05-16 DIAGNOSIS — Z7689 Persons encountering health services in other specified circumstances: Secondary | ICD-10-CM

## 2020-05-16 MED ORDER — SEMAGLUTIDE-WEIGHT MANAGEMENT 2.4 MG/0.75ML ~~LOC~~ SOAJ: 2.4000 mg | SUBCUTANEOUS | 1 refills | Status: DC

## 2020-05-16 NOTE — Progress Notes (Signed)
Subjective:    CC: Weight check, right hip pain, left lower quadrant pain  HPI: Pleasant 60 year old female presenting today for the following:  Weight check-has been taking Wegovy 2.4 mg weekly as prescribed, tolerating well without side effects.  She is very discouraged because her weight is only dropped by 2 pounds from our last recording in the end of January.  She does note that her clothes are fitting better and she feels better overall.  Her activity level has decreased a bit because she is having significant right hip pain.  She is watching her portion sizes but admits that she is snacking on dill pickles at night before bed.  Right hip pain-has noted an increase in her right hip pain over the last few months.  Is not associated with any particular activity but notes that it does get worse when she is up and walking for exercise.  Left lower quadrant pain-for the last few months, she has had intermittent sharp stabbing pains in the left lower quadrant the last about 2 or 3 seconds.  This happens daily at least once but sometimes multiple times a day.  She does have some stooling issues where her stools are sometimes solid and hard to pass and other times running.  She does not have a bowel movement every day.  No fevers, chills, vaginal bleeding, or urinary symptoms.   I reviewed the past medical history, family history, social history, surgical history, and allergies today and no changes were needed.  Please see the problem list section below in epic for further details.  Past Medical History: Past Medical History:  Diagnosis Date  . Acute respiratory failure ([place])   . Diabetes mellitus without complication ([place])   . GERD (gastroesophageal reflux disease)   . Hypertension   . Menopausal vasomotor syndrome   . Obesity   . Pre-diabetes   . RSV (respiratory syncytial virus pneumonia)    Past Surgical History: Past Surgical History:  Procedure Laterality Date  . ANKLE ARTHROSCOPY  Left 1985  . COLONOSCOPY  2011   IN [place]=diverticulosis  . DILATION AND CURETTAGE OF UTERUS N/A 08/14/2017   Procedure: DILATATION AND CURETTAGE;  Surgeon: Emily Filbert, MD;  Location: [facility];  Service: Gynecology;  Laterality: N/A;  . REPLACEMENT TOTAL KNEE Right 2021   Social History: Social History   Socioeconomic History  . Marital status: Married    Spouse name: Not on file  . Number of children: 4  . Years of education: Not on file  . Highest education level: Not on file  Occupational History  . Not on file  Tobacco Use  . Smoking status: Never Smoker  . Smokeless tobacco: Never Used  Vaping Use  . Vaping Use: Never used  Substance and Sexual Activity  . Alcohol use: Not Currently  . Drug use: Never  . Sexual activity: Yes    Birth control/protection: Post-menopausal  Other Topics Concern  . Not on file  Social History Narrative   Lives at home with wife and teenage daughter   Social Determinants of Health   Financial Resource Strain: Not on file  Food Insecurity: Not on file  Transportation Needs: Not on file  Physical Activity: Not on file  Stress: Not on file  Social Connections: Not on file   Family History: Family History  Problem Relation Age of Onset  . Heart disease Mother   . Hypertension Mother   . Heart disease Father   . Colon polyps Father   .  Breast cancer Maternal Grandmother   . Colon cancer Neg Hx   . Esophageal cancer Neg Hx   . Rectal cancer Neg Hx   . Stomach cancer Neg Hx    Allergies: No Known Allergies Medications: See med rec.  Review of Systems: See HPI for pertinent positives and negatives.   Objective:    General: Well Developed, well nourished, and in no acute distress.  Neuro: Alert and oriented x3.  HEENT: Normocephalic, atraumatic.  Skin: Warm and dry. Cardiac: Regular rate and rhythm, no murmurs rubs or gallops, no lower extremity edema.  Respiratory: Clear to auscultation bilaterally.  Not using accessory muscles, speaking in full sentences. Abdomen: Soft, nontender, nondistended. Bowel sounds + x 4 quadrants. No HSM appreciated. Right hip: Pain in the lateral hip with full flexion. Negative FABER, FADIR. No weakness or limited ROM.    Impression and Recommendations:    1. Class 1 obesity due to excess calories with serious comorbidity and body mass index (BMI) of 33.0 to 33.9 in adult Continue Wegovy 2.4m weekly. Increase exercise as tolerated. Watch sodium intake as this can lead to water retention and inaccuracies on weight.   2. Right hip pain X-rays of the right hip today. Likely osteoarthritis but possibly related to her gait change after her knee replacement. Continue regular activity and stretching to maintain strength. Ok to use tylenol/ibuprofen as needed in conjunction with heat/ice, massage, and topical analgesics.  - DG Hip Unilat W OR W/O Pelvis Min 4 Views Right; Future  3. LLQ pain Unclear etiology. Unable to reproduce in the office. Possible bowel cause in the setting of small bowel Crohn's disease discovered on colonoscopy. Discussed imaging/workup options and patient would like to see her GI on 4/8 to get his opinion before further workup.   Return in about 3 months (around 08/16/2020) for weight check. ___________________________________________ JClearnce Sorrel DNP, APRN, FNP-BC [facility] and [place]

## 2020-05-16 NOTE — Patient Instructions (Signed)
Crohn's Disease  Crohn's disease is a long-lasting (chronic) disease that affects the gastrointestinal (GI) tract. Crohn's disease often causes irritation and inflammation in the small intestine and the beginning of the large intestine, but it can affect any part of the GI tract. Crohn's disease is part of a group of illnesses that are known as inflammatory bowel disease (IBD). Crohn's disease may start slowly and get worse over time. Symptoms may come and go. They may also go away for months or even years at a time (remission). What are the causes? The exact cause of this condition is not known. It may involve a response that causes your body's disease-fighting (immune) system to attack healthy cells and tissues (autoimmune response). Bacteria, genes, and your environment may also play a role. What increases the risk? The following factors may make you more likely to develop this condition:  Having a family member who has Crohn's disease, another IBD, or an autoimmune condition.  Using products that contain nicotine or tobacco, such as cigarettes and e-cigarettes.  Being in your 14s.  Having [place] European ancestry. What are the signs or symptoms? The main symptoms of this condition involve your GI tract. These include:  Diarrhea.  Pain or cramping in the abdomen commonly felt in the lower right side of the abdomen.  Frequent watery or bloody stools.  Constipation. This may mean having: ? Fewer bowel movements in a week than normal. ? Difficulty having a bowel movement. ? Stools that are dry, hard, or larger than normal.  Rectal bleeding.  Rectal pain.  An urgent need to have a bowel movement.  The feeling that you are not finished having a bowel movement. Other symptoms may include:  Unexplained weight loss.  Fatigue.  Fever.  Nausea or appetite loss.  Joint pain.  Vision changes.  Red bumps or sores on the skin.  Sores inside the mouth. How is this  diagnosed? This condition may be diagnosed based on:  Your symptoms and medical history.  A physical exam.  Tests, which may include: ? Blood tests. ? Stool sample tests. ? Imaging tests, such as X-rays and CT scans. ? Tests to examine the inside of your intestines using a long, flexible tube that has a light and a camera on the end (colonoscopy). ? A procedure to remove tissue samples from inside your bowel for testing (biopsy). You may need to work with a health care provider who specializes in diseases of the digestive tract (gastroenterologist). How is this treated? There is no cure for this condition, and it affects each person differently. Treatment can help you manage your symptoms. Your treatment may include:  Resting your bowels. This involves having a period of healing time when your bowels are not passing stools. This may be done by: ? Drinking only clear liquids. These are liquids that you can see through, such as water, black coffee, fruit juice without pulp, broth, gelatin, and ice pops. ? Getting nutrition through an IV for a period of time.  Medicines. These may be used by themselves or with other treatments (combination therapy). You may be given medicines that help to: ? Reduce inflammation. ? Control your immune system activity. ? Fight infections. ? Relieve cramps and prevent diarrhea. ? Control your pain.  Surgery. You may need surgery if: ? Medicines and other treatments are not working anymore. ? You develop complications from severe Crohn's disease. ? A section of your intestine becomes so damaged that it needs to be removed. Follow these  instructions at home: Medicines  Take over-the-counter and prescription medicines only as told by your health care provider.  If you were prescribed an antibiotic, take it as told by your health care provider. Do not stop taking the antibiotic even if you start to feel better.  Avoid taking ibuprofen or other NSAID  medicines if possible, these can make Crohn's disease worse. Eating and drinking  Talk with your health care provider or a diet and nutrition specialist (registered dietitian) about what diet is best for you.  Drink enough fluid to keep your urine pale yellow.  If you are taking steroids to reduce inflammation, get plenty of calcium in your diet to help keep your bones healthy. You may also consider taking a calcium supplement with vitamin D.  Keep a food diary to identify foods that make your symptoms better or worse, and avoid foods that cause symptoms.  Follow instructions from your health care provider about eating or drinking restrictions if you have worsening symptoms (flare-up).  Limit alcohol intake to no more than 1 drink a day for nonpregnant women and 2 drinks a day for men. One drink equals 12 oz of beer, 5 oz of wine, or 1 oz of hard liquor. General instructions  Make sure you get all the vaccines that your health care provider recommends, especially pneumonia (pneumococcal) and flu (influenza) vaccines.  Do not use any products that contain nicotine or tobacco, such as cigarettes and e-cigarettes. If you need help quitting, ask your health care provider.  Exercise every day, or as often told by your health care provider.  Keep all follow-up visits as told by your health care provider. This is important. Contact a health care provider if:  You have diarrhea, cramps in your abdomen, and other GI problems that are present almost all the time.  Your symptoms do not improve with treatment.  You continue to lose weight.  You develop a rash or sores on your skin.  You develop eye problems.  You have a fever.  Your symptoms get worse or you develop new symptoms. Get help right away if:  You have bloody diarrhea.  You have severe pain in your abdomen.  You cannot pass stools. Summary  Crohn's disease affects each person differently. The cause of this condition is  not known, but it may involve a response that causes your body's immune system to attack healthy cells and tissues.  There are multiple treatment options that can help you manage the condition. Talk with your health care provider or diet and nutrition specialist (registered dietitian) about what diet is best for you.  Make sure you get all the vaccines that your health care provider recommends, especially pneumonia (pneumococcal) and flu (influenza) vaccines. This information is not intended to replace advice given to you by your health care provider. Make sure you discuss any questions you have with your health care provider. Document Revised: 10/06/2019 Document Reviewed: 10/06/2019 Elsevier Patient Education  2021 [employer].

## 2020-05-18 ENCOUNTER — Other Ambulatory Visit: Payer: Self-pay | Admitting: Medical-Surgical

## 2020-05-18 ENCOUNTER — Encounter: Payer: Self-pay | Admitting: Medical-Surgical

## 2020-05-18 MED ORDER — CELECOXIB 200 MG PO CAPS: ORAL_CAPSULE | ORAL | 2 refills | Status: DC

## 2020-05-25 ENCOUNTER — Other Ambulatory Visit (INDEPENDENT_AMBULATORY_CARE_PROVIDER_SITE_OTHER): Payer: 59

## 2020-05-25 ENCOUNTER — Other Ambulatory Visit: Payer: Self-pay

## 2020-05-25 ENCOUNTER — Encounter: Payer: Self-pay | Admitting: Gastroenterology

## 2020-05-25 ENCOUNTER — Ambulatory Visit (INDEPENDENT_AMBULATORY_CARE_PROVIDER_SITE_OTHER): Payer: 59 | Admitting: Gastroenterology

## 2020-05-25 VITALS — BP 104/70 | HR 84 | Ht 64.0 in | Wt 198.1 lb

## 2020-05-25 DIAGNOSIS — K219 Gastro-esophageal reflux disease without esophagitis: Secondary | ICD-10-CM

## 2020-05-25 DIAGNOSIS — K50919 Crohn's disease, unspecified, with unspecified complications: Secondary | ICD-10-CM | POA: Diagnosis not present

## 2020-05-25 DIAGNOSIS — R112 Nausea with vomiting, unspecified: Secondary | ICD-10-CM | POA: Diagnosis not present

## 2020-05-25 LAB — CBC WITH DIFFERENTIAL/PLATELET
Basophils Absolute: 0 10*3/uL (ref 0.0–0.1)
Basophils Relative: 0.7 % (ref 0.0–3.0)
Eosinophils Absolute: 0.1 10*3/uL (ref 0.0–0.7)
Eosinophils Relative: 1.5 % (ref 0.0–5.0)
HCT: 34.5 % — ABNORMAL LOW (ref 36.0–46.0)
Hemoglobin: 11.3 g/dL — ABNORMAL LOW (ref 12.0–15.0)
Lymphocytes Relative: 19.1 % (ref 12.0–46.0)
Lymphs Abs: 0.9 10*3/uL (ref 0.7–4.0)
MCHC: 32.8 g/dL (ref 30.0–36.0)
MCV: 75.1 fl — ABNORMAL LOW (ref 78.0–100.0)
Monocytes Absolute: 0.3 10*3/uL (ref 0.1–1.0)
Monocytes Relative: 7 % (ref 3.0–12.0)
Neutro Abs: 3.2 10*3/uL (ref 1.4–7.7)
Neutrophils Relative %: 71.7 % (ref 43.0–77.0)
Platelets: 232 10*3/uL (ref 150.0–400.0)
RBC: 4.59 Mil/uL (ref 3.87–5.11)
RDW: 15.6 % — ABNORMAL HIGH (ref 11.5–15.5)
WBC: 4.5 10*3/uL (ref 4.0–10.5)

## 2020-05-25 LAB — COMPREHENSIVE METABOLIC PANEL
ALT: 12 U/L (ref 0–35)
AST: 15 U/L (ref 0–37)
Albumin: 4.2 g/dL (ref 3.5–5.2)
Alkaline Phosphatase: 79 U/L (ref 39–117)
BUN: 15 mg/dL (ref 6–23)
CO2: 35 mEq/L — ABNORMAL HIGH (ref 19–32)
Calcium: 9.8 mg/dL (ref 8.4–10.5)
Chloride: 97 mEq/L (ref 96–112)
Creatinine, Ser: 0.75 mg/dL (ref 0.40–1.20)
GFR: 86.53 mL/min (ref >60.00)
Glucose, Bld: 96 mg/dL (ref 70–99)
Potassium: 4 mEq/L (ref 3.5–5.1)
Sodium: 140 mEq/L (ref 135–145)
Total Bilirubin: 0.6 mg/dL (ref 0.2–1.2)
Total Protein: 6.7 g/dL (ref 6.0–8.3)

## 2020-05-25 LAB — VITAMIN B12: Vitamin B-12: 251 pg/mL (ref 211–911)

## 2020-05-25 LAB — HIGH SENSITIVITY CRP: CRP, High Sensitivity: 4.51 mg/L (ref 0.000–5.000)

## 2020-05-25 NOTE — Addendum Note (Signed)
Addended by: Manuela Schwartz on: 05/25/2020 02:35 PM   Modules accepted: Orders

## 2020-05-25 NOTE — Patient Instructions (Addendum)
If you are age 61 or older, your body mass index should be between 23-30. Your Body mass index is 34.01 kg/m. If this is out of the aforementioned range listed, please consider follow up with your Primary Care Provider.  If you are age 44 or younger, your body mass index should be between 19-25. Your Body mass index is 34.01 kg/m. If this is out of the aformentioned range listed, please consider follow up with your Primary Care Provider.   Please go to the lab on the 2nd floor [address] before you leave the office today.   Dr Lyndel Safe nurse will be contacting you regarding information on Humira.  CT Entero at [facility] on 05/31/2020 arrival time at 2:45pm at the first floor of hospital. Nothing by mouth 4 hours prior to the appointment so nothing by mouth after 12pm. Appointment time at 4pm

## 2020-05-25 NOTE — Progress Notes (Signed)
Chief Complaint: Newly diagnosed Crohn's  Referring Provider:  Samuel Bouche, NP      ASSESSMENT AND PLAN;    #1. Ileal Crohn's Disease with stricture. Has IDA #2. GERD with occ N/V.   Plan: - Stool for GI pathogen, fecal calprotectin. - Check CBC, CMP, CRP, B12, TB Gold, TPMT and HBsAg. - CTE to determine extent of Crohn's. - EGD for further evaluation. R/O UGI Crohn's - Information regarding Humira. - Can start iron supplements. - She would like to avoid steroids.  Hence, prednisone only if absolutely necessary. - Avoid NSAIDs.    HPI:    Megan [name] is a 60 y.o. female   For FU after colon-performed for colorectal cancer screening.  She was found to have terminal ileal stricture with multiple nonbleeding aphthous erosions.  Biopsies came back as Crohn's disease.  She also had moderate sigmoid diverticulosis.  And small polyps which were non-adenomatous.  Occ diarrhea but not always.  On worst days, she would have 3 bowel movements without nocturnal symptoms.  This has been occurring for over 5-6 yrs.  No significant constipation.  No melena or hematochezia.  No weight loss.  Has GERD at night with occasional nausea/vomiting.  This is despite Prevacid.  No odynophagia or dysphagia.  No fever chills or night sweats.  No family history of Crohn's disease.  Occ LLQ pain without fever or chills, more prominent after colonoscopy.  She does have low back pain, joint pains attributed to osteoarthritis.  No skin rash or eye problems.  Past Medical History:  Diagnosis Date  . Acute respiratory failure ([place])   . Diabetes mellitus without complication ([place])   . GERD (gastroesophageal reflux disease)   . Hypertension   . Menopausal vasomotor syndrome   . Obesity   . Pre-diabetes   . RSV (respiratory syncytial virus pneumonia)     Past Surgical History:  Procedure Laterality Date  . ANKLE ARTHROSCOPY Left 1985  . COLONOSCOPY  2011   IN [place]=diverticulosis   . DILATION AND CURETTAGE OF UTERUS N/A 08/14/2017   Procedure: DILATATION AND CURETTAGE;  Surgeon: Emily Filbert, MD;  Location: [facility];  Service: Gynecology;  Laterality: N/A;  . REPLACEMENT TOTAL KNEE Right 2021    Family History  Problem Relation Age of Onset  . Heart disease Mother   . Hypertension Mother   . Heart disease Father   . Colon polyps Father   . Breast cancer Maternal Grandmother   . Colon cancer Neg Hx   . Esophageal cancer Neg Hx   . Rectal cancer Neg Hx   . Stomach cancer Neg Hx     Social History   Tobacco Use  . Smoking status: Never Smoker  . Smokeless tobacco: Never Used  Vaping Use  . Vaping Use: Never used  Substance Use Topics  . Alcohol use: Not Currently  . Drug use: Never    Current Outpatient Medications  Medication Sig Dispense Refill  . aspirin EC 81 MG tablet Take 1 tablet (81 mg total) by mouth daily. 90 tablet 3  . celecoxib (CELEBREX) 200 MG capsule One to 2 tablets by mouth daily as needed for pain. 60 capsule 2  . hydrochlorothiazide (HYDRODIURIL) 25 MG tablet TAKE 1 TABLET BY MOUTH DAILY 90 tablet 1  . lansoprazole (PREVACID) 30 MG capsule Take 1 capsule (30 mg total) by mouth daily at 12 noon. 30 capsule 5  . Semaglutide-Weight Management 2.4 MG/0.75ML SOAJ Inject 2.4 mg into the skin  once a week. 9 mL 1  . sertraline (ZOLOFT) 50 MG tablet Take 1 tablet (50 mg total) by mouth at bedtime. 30 tablet 0   No current facility-administered medications for this visit.    No Known Allergies  Review of Systems:  Constitutional: Denies fever, chills, diaphoresis, appetite change and has fatigue.  HEENT: Denies photophobia, eye pain, redness, hearing loss, ear pain, congestion, sore throat, rhinorrhea, sneezing, mouth sores, neck pain, neck stiffness and tinnitus.   Respiratory: Denies SOB, DOE, cough, chest tightness,  and wheezing.   Cardiovascular: Denies chest pain, palpitations and leg swelling.  Genitourinary:  Denies dysuria, urgency, frequency, hematuria, flank pain and difficulty urinating.  Musculoskeletal: has myalgias, back pain, No joint swelling, arthralgias and gait problem.  Skin: No rash.  Neurological: Denies dizziness, seizures, syncope, weakness, light-headedness, numbness and headaches.  Hematological: Denies adenopathy. Easy bruising, personal or family bleeding history  Psychiatric/Behavioral: Has anxiety or depression     Physical Exam:    BP 104/70 (BP Location: Right Arm, Patient Position: Sitting, Cuff Size: Normal)   Pulse 84   Ht 5' 4"  (1.626 m)   Wt 198 lb 2 oz (89.9 kg)   BMI 34.01 kg/m  Wt Readings from Last 3 Encounters:  05/25/20 198 lb 2 oz (89.9 kg)  05/16/20 196 lb 0.6 oz (88.9 kg)  04/10/20 202 lb (91.6 kg)   Constitutional:  Well-developed, in no acute distress. Psychiatric: Normal mood and affect. Behavior is normal. HEENT: Pupils normal.  Conjunctivae are normal. No scleral icterus. Cardiovascular: Normal rate, regular rhythm. No edema Pulmonary/chest: Effort normal and breath sounds normal. No wheezing, rales or rhonchi. Abdominal: Soft, nondistended. Nontender. Bowel sounds active throughout. There are no masses palpable. No hepatomegaly. Rectal: Deferred Neurological: Alert and oriented to person place and time. Skin: Skin is warm and dry. No rashes noted.  Data Reviewed: I have personally reviewed following labs and imaging studies  CBC: CBC Latest Ref Rng & Units 05/19/2019 09/16/2018 09/07/2017  WBC 3.8 - 10.8 Thousand/uL 5.2 5.3 5.3  Hemoglobin 11.7 - 15.5 g/dL 10.9(L) 14.5 11.9  Hematocrit 35.0 - 45.0 % 36.4 40.2 36.7  Platelets 140 - 400 Thousand/uL 289 283 218    CMP: CMP Latest Ref Rng & Units 05/19/2019 09/16/2018 09/07/2017  Glucose 65 - 99 mg/dL 104(H) 110(H) 102  BUN 7 - 25 mg/dL 15 16 9   Creatinine 0.50 - 1.05 mg/dL 0.79 0.80 0.86  Sodium 135 - 146 mmol/L 139 140 140  Potassium 3.5 - 5.3 mmol/L 5.0 4.3 4.3  Chloride 98 - 110 mmol/L  101 102 99  CO2 20 - 32 mmol/L 29 27 34(H)  Calcium 8.6 - 10.4 mg/dL 9.7 9.8 9.9  Total Protein 6.1 - 8.1 g/dL 6.7 7.3 7.0  Total Bilirubin 0.2 - 1.2 mg/dL 0.5 0.6 0.7  AST 10 - 35 U/L 31 31 22   ALT 6 - 29 U/L 32(H) 31(H) 19      Radiology Studies: DG Hip Unilat W OR W/O Pelvis Min 4 Views Right  Result Date: 05/16/2020 CLINICAL DATA:  Chronic right hip pain worsening over last several months EXAM: DG HIP (WITH OR WITHOUT PELVIS) 4+V RIGHT COMPARISON:  None. FINDINGS: Frontal view of the pelvis as well as frontal and frogleg lateral views of the right hip are obtained. There are no acute displaced fractures. Joint spaces are well preserved. Mild enthesopathic changes right greater trochanter. Sacroiliac joints are normal. The remainder of the bony pelvis is unremarkable. Spondylosis and facet hypertrophy noted at  L4-5 and L5-S1. IMPRESSION: 1. No acute displaced fracture. 2. Lower lumbar degenerative changes. Electronically Signed   By: Randa Ngo M.D.   On: 05/16/2020 20:58      Carmell Austria, MD 05/25/2020, 8:46 AM  Cc: Samuel Bouche, NP

## 2020-05-29 LAB — GI PROFILE, STOOL, PCR

## 2020-05-29 LAB — CALPROTECTIN, FECAL: Calprotectin, Fecal: 249 ug/g — ABNORMAL HIGH (ref 0–120)

## 2020-05-29 NOTE — Telephone Encounter (Signed)
Vaughan Basta, procedure was booked on Friday.  I was in [employer] training someone and off on Monday.  Es was covering.  I was notified by preservice center checking on status today.   Case was not started.  I started case today, and had to upload records for mandatory review.  Could be 2-3 days before it comes back.  If it comes back sooner, I'll let you know.

## 2020-05-30 ENCOUNTER — Telehealth: Payer: Self-pay | Admitting: Gastroenterology

## 2020-05-30 NOTE — Telephone Encounter (Signed)
Order faxed, pt states she will be called to set up the scan once the orders are received.

## 2020-05-30 NOTE — Telephone Encounter (Signed)
-----   Message from [place], [place] sent at 05/25/2020  4:29 PM EDT ----- Regarding: Humira Hey hey sorry its late. I was told to send you a message regarding this patient to help her with getting started with Humira. Can you please help? Thank you

## 2020-05-30 NOTE — Telephone Encounter (Signed)
Inbound call from patient insurance Christella Scheuermann, [place]. States patient wants CT done at [facility] on [address] due to cost. She will need order faxed to [phone]. Karena [facility] states if you have any questions can call her back at [phone] or can give the patient a call.

## 2020-05-31 ENCOUNTER — Ambulatory Visit (HOSPITAL_COMMUNITY): Payer: 59

## 2020-05-31 NOTE — Progress Notes (Signed)
Please inform the patient. Stool studies are negative for any infection Elevated fecal calprotectin Has anemia Neg hepatitis B surface antigen, TB Gold Plan: -Start Humira Day 1- 143m, day 15 - 814m then 4022mQ every other week, starting day 29.  I think it would help with arthralgias/arthritis as well. -Proceed with CTE (already scheduled) -FU with me or APP clinic in 6-8 weeks  Send report to family physician

## 2020-06-05 ENCOUNTER — Other Ambulatory Visit: Payer: Self-pay

## 2020-06-05 ENCOUNTER — Ambulatory Visit (AMBULATORY_SURGERY_CENTER): Payer: 59 | Admitting: Gastroenterology

## 2020-06-05 ENCOUNTER — Encounter: Payer: Self-pay | Admitting: Gastroenterology

## 2020-06-05 VITALS — BP 101/71 | HR 74 | Temp 98.2°F | Resp 22 | Ht 64.0 in | Wt 198.0 lb

## 2020-06-05 DIAGNOSIS — K2970 Gastritis, unspecified, without bleeding: Secondary | ICD-10-CM | POA: Diagnosis not present

## 2020-06-05 DIAGNOSIS — K449 Diaphragmatic hernia without obstruction or gangrene: Secondary | ICD-10-CM

## 2020-06-05 DIAGNOSIS — K317 Polyp of stomach and duodenum: Secondary | ICD-10-CM

## 2020-06-05 DIAGNOSIS — K219 Gastro-esophageal reflux disease without esophagitis: Secondary | ICD-10-CM | POA: Diagnosis not present

## 2020-06-05 DIAGNOSIS — K50919 Crohn's disease, unspecified, with unspecified complications: Secondary | ICD-10-CM | POA: Diagnosis present

## 2020-06-05 DIAGNOSIS — K3189 Other diseases of stomach and duodenum: Secondary | ICD-10-CM | POA: Diagnosis not present

## 2020-06-05 LAB — QUANTIFERON-TB GOLD PLUS
Mitogen-NIL: 3.81 IU/mL
NIL: 0.06 IU/mL
QuantiFERON-TB Gold Plus: NEGATIVE
TB1-NIL: <0 IU/mL
TB2-NIL: <0 IU/mL

## 2020-06-05 LAB — HEPATITIS B SURFACE ANTIGEN: Hepatitis B Surface Ag: NONREACTIVE

## 2020-06-05 LAB — THIOPURINE S-METHYLTRANSFERASE (TPMT) GENOTYPE

## 2020-06-05 MED ORDER — SODIUM CHLORIDE 0.9 % IV SOLN: 500.0000 mL | Freq: Once | INTRAVENOUS | Status: DC

## 2020-06-05 MED ORDER — PANTOPRAZOLE SODIUM 40 MG PO TBEC: 40.0000 mg | DELAYED_RELEASE_TABLET | Freq: Every day | ORAL | 4 refills | Status: DC

## 2020-06-05 NOTE — Progress Notes (Signed)
To PACU, VSS. Report to Rn.tb 

## 2020-06-05 NOTE — Op Note (Signed)
[place] Patient Name: Megan [name] Procedure Date: 06/05/2020 9:24 AM MRN: [id number] Endoscopist: Jackquline Denmark , MD Age: 60 Referring MD:  Date of Birth: Sep 05, 1960 Gender: Female Account #: [id number] Procedure:                Upper GI endoscopy Indications:              GERD, N/V, recent diagnosis of Crohn's disease. Medicines:                Monitored Anesthesia Care Procedure:                Pre-Anesthesia Assessment:                           - Prior to the procedure, a History and Physical                            was performed, and patient medications and                            allergies were reviewed. The patient's tolerance of                            previous anesthesia was also reviewed. The risks                            and benefits of the procedure and the sedation                            options and risks were discussed with the patient.                            All questions were answered, and informed consent                            was obtained. Prior Anticoagulants: The patient has                            taken no previous anticoagulant or antiplatelet                            agents. ASA Grade Assessment: II - A patient with                            mild systemic disease. After reviewing the risks                            and benefits, the patient was deemed in                            satisfactory condition to undergo the procedure.                           After obtaining informed consent, the endoscope was  passed under direct vision. Throughout the                            procedure, the patient's blood pressure, pulse, and                            oxygen saturations were monitored continuously. The                            Endoscope was introduced through the mouth, and                            advanced to the second part of duodenum. The upper                            GI  endoscopy was accomplished without difficulty.                            The patient tolerated the procedure well. Scope In: Scope Out: Findings:                 The esophagus was mildly tortuous and foreshortened                            but normal d/t HH.                           A large hiatal hernia was present extending from 34                            up to 40 cm (GE Jn to diaphragmatic hiatus).                           Some retained food in the stomach Limited                            examination. Localized mild inflammation                            characterized by erythema was found in the gastric                            body and in the gastric antrum. Biopsies were taken                            with a cold forceps for histology.                           A single 10 mm submucosal sessile polyp with no                            bleeding and no stigmata of recent bleeding was  found in the gastric antrum. Biopsies were taken                            with a cold forceps for histology using the tunnel                            technique.                           The examined duodenum was normal. Biopsies for                            histology were taken with a cold forceps for                            evaluation of celiac disease. Complications:            No immediate complications. Estimated Blood Loss:     Estimated blood loss: none. Impression:               - Large hiatal hernia.                           - Gastritis. Biopsied.                           - A single gastric polyp. Biopsied. Recommendation:           - Patient has a contact number available for                            emergencies. The signs and symptoms of potential                            delayed complications were discussed with the                            patient. Return to normal activities tomorrow.                            Written discharge  instructions were provided to the                            patient.                           - Resume previous diet.                           - Change Prevacid to Protonix 40 mg p.o. once a day                            #90, 4 refills                           - Await pathology results.                           -  The findings and recommendations were discussed                            with the patient's family.                           - Follow an antireflux regimen.                           - FU in GI clinic in 6 to 8 weeks. Jackquline Denmark, MD 06/05/2020 9:58:20 AM This report has been signed electronically.

## 2020-06-05 NOTE — Progress Notes (Signed)
Pt's states no medical or surgical changes since previsit or office visit.  CT - vitals  CT scan on April 14 per Dr. Lyndel Safe

## 2020-06-05 NOTE — Patient Instructions (Addendum)
Read all of the handouts given to you by your recovery room nurse.  Your new medication is at the pharmacy.  Please take per instructions  YOU HAD AN ENDOSCOPIC PROCEDURE TODAY AT [facility]:   Refer to the procedure report that was given to you for any specific questions about what was found during the examination.  If the procedure report does not answer your questions, please call your gastroenterologist to clarify.  If you requested that your care partner not be given the details of your procedure findings, then the procedure report has been included in a sealed envelope for you to review at your convenience later.  YOU SHOULD EXPECT: Some feelings of bloating in the abdomen. Passage of more gas than usual.  Walking can help get rid of the air that was put into your GI tract during the procedure and reduce the bloating.   Please Note:  You might notice some irritation and congestion in your nose or some drainage.  This is from the oxygen used during your procedure.  There is no need for concern and it should clear up in a day or so.  SYMPTOMS TO REPORT IMMEDIATELY:    Following upper endoscopy (EGD)  Vomiting of blood or coffee ground material  New chest pain or pain under the shoulder blades  Painful or persistently difficult swallowing  New shortness of breath  Fever of 100F or higher  Black, tarry-looking stools  For urgent or emergent issues, a gastroenterologist can be reached at any hour by calling [phone]. Do not use MyChart messaging for urgent concerns.    DIET:  We do recommend a small meal at first, but then you may proceed to your regular diet.  Drink plenty of fluids but you should avoid alcoholic beverages for 24 hours.  ACTIVITY:  You should plan to take it easy for the rest of today and you should NOT DRIVE or use heavy machinery until tomorrow (because of the sedation medicines used during the test).    FOLLOW UP: Our staff will call the  number listed on your records 48-72 hours following your procedure to check on you and address any questions or concerns that you may have regarding the information given to you following your procedure. If we do not reach you, we will leave a message.  We will attempt to reach you two times.  During this call, we will ask if you have developed any symptoms of COVID 19. If you develop any symptoms (ie: fever, flu-like symptoms, shortness of breath, cough etc.) before then, please call [phone].  If you test positive for Covid 19 in the 2 weeks post procedure, please call and report this information to [place].    If any biopsies were taken you will be contacted by phone or by letter within the next 1-3 weeks.  Please call us at [phone] if you have not heard about the biopsies in 3 weeks.    SIGNATURES/CONFIDENTIALITY: You and/or your care partner have signed paperwork which will be entered into your electronic medical record.  These signatures attest to the fact that that the information above on your After Visit Summary has been reviewed and is understood.  Full responsibility of the confidentiality of this discharge information lies with you and/or your care-partner.

## 2020-06-05 NOTE — Progress Notes (Signed)
Called to room to assist during endoscopic procedure.  Patient ID and intended procedure confirmed with present staff. Received instructions for my participation in the procedure from the performing physician.  

## 2020-06-07 ENCOUNTER — Telehealth: Payer: Self-pay

## 2020-06-07 NOTE — Telephone Encounter (Signed)
  Follow up Call-  Call back number 06/05/2020 04/10/2020  Post procedure Call Back phone  # [phone]  Permission to leave phone message Yes Yes  Some recent data might be hidden     Patient questions:  Do you have a fever, pain , or abdominal swelling? No. Pain Score  0 *  Have you tolerated food without any problems? Yes.    Have you been able to return to your normal activities? Yes.    Do you have any questions about your discharge instructions: Diet   No. Medications  No. Follow up visit  No.  Do you have questions or concerns about your Care? No.  Actions: * If pain score is 4 or above: No action needed, pain <4. 1. Have you developed a fever since your procedure? no  2.   Have you had an respiratory symptoms (SOB or cough) since your procedure? no  3.   Have you tested positive for COVID 19 since your procedure no  4.   Have you had any family members/close contacts diagnosed with the COVID 19 since your procedure?  no   If yes to any of these questions please route to Joylene John, RN and Joella Prince, RN

## 2020-06-13 ENCOUNTER — Telehealth: Payer: Self-pay

## 2020-06-13 NOTE — Telephone Encounter (Signed)
Dr. Lyndel Safe the insurance company has denied Humira for Megan [name]. Per her insurance company pt must have tried prednisone, budesonide, or methotrexate for at least 3 months before Humira would be approved.  You may discuss with a clinical reviewer M-F from 7am-5pm at [phone].

## 2020-06-14 ENCOUNTER — Encounter: Payer: Self-pay | Admitting: Gastroenterology

## 2020-06-20 NOTE — Telephone Encounter (Signed)
Have discussed with insurance company/pharmacist.  They have approved Humira.  There will be sending an approval letter (case #[id number])  Plan: Start Humira as per last note. Please inform pt Proceed with CTE  RG

## 2020-06-21 ENCOUNTER — Encounter: Payer: Self-pay | Admitting: Medical-Surgical

## 2020-06-21 ENCOUNTER — Other Ambulatory Visit: Payer: Self-pay | Admitting: Medical-Surgical

## 2020-06-21 DIAGNOSIS — F325 Major depressive disorder, single episode, in full remission: Secondary | ICD-10-CM

## 2020-06-21 NOTE — Telephone Encounter (Signed)
Called and spoke with [facility] and gave them the case number and the approval information. They will reach out to insurance company and pt to get script filled and out to the pt. Pt aware.

## 2020-06-26 ENCOUNTER — Encounter: Payer: Self-pay | Admitting: Medical-Surgical

## 2020-07-17 ENCOUNTER — Other Ambulatory Visit: Payer: Self-pay | Admitting: Medical-Surgical

## 2020-07-17 DIAGNOSIS — F325 Major depressive disorder, single episode, in full remission: Secondary | ICD-10-CM

## 2020-07-21 NOTE — Telephone Encounter (Signed)
Lets see Megan [name] in 6-8 weeks RG

## 2020-07-30 ENCOUNTER — Ambulatory Visit: Payer: 59 | Admitting: Gastroenterology

## 2020-08-10 ENCOUNTER — Encounter: Payer: Self-pay | Admitting: Medical-Surgical

## 2020-08-10 ENCOUNTER — Ambulatory Visit (INDEPENDENT_AMBULATORY_CARE_PROVIDER_SITE_OTHER): Payer: 59 | Admitting: Medical-Surgical

## 2020-08-10 ENCOUNTER — Other Ambulatory Visit: Payer: Self-pay

## 2020-08-10 VITALS — BP 103/69 | HR 85 | Temp 98.4°F | Ht 64.0 in | Wt 191.3 lb

## 2020-08-10 DIAGNOSIS — E6609 Other obesity due to excess calories: Secondary | ICD-10-CM

## 2020-08-10 DIAGNOSIS — I10 Essential (primary) hypertension: Secondary | ICD-10-CM

## 2020-08-10 DIAGNOSIS — F325 Major depressive disorder, single episode, in full remission: Secondary | ICD-10-CM | POA: Diagnosis not present

## 2020-08-10 DIAGNOSIS — Z7689 Persons encountering health services in other specified circumstances: Secondary | ICD-10-CM

## 2020-08-10 DIAGNOSIS — Z6832 Body mass index (BMI) 32.0-32.9, adult: Secondary | ICD-10-CM

## 2020-08-10 MED ORDER — SERTRALINE HCL 50 MG PO TABS: 50.0000 mg | ORAL_TABLET | Freq: Every day | ORAL | 1 refills | Status: DC

## 2020-08-10 MED ORDER — SEMAGLUTIDE-WEIGHT MANAGEMENT 2.4 MG/0.75ML ~~LOC~~ SOAJ: 2.4000 mg | SUBCUTANEOUS | 1 refills | Status: DC

## 2020-08-10 MED ORDER — HYDROCHLOROTHIAZIDE 25 MG PO TABS: 1.0000 | ORAL_TABLET | Freq: Every day | ORAL | 1 refills | Status: DC

## 2020-08-10 NOTE — Progress Notes (Signed)
Subjective:    CC: Weight check, hypertension, mood follow-up  HPI: Pleasant 60 year old female presenting today for weight check on Wegovy.  She has been doing well on Wegovy 2.5 mg injected weekly.  She is tolerating the medication well without side effects.  She is continue to see some weight loss.  Did have some issues over the last few months with Crohn's flare and reports she has started Humira, her second dose this past Friday.  Notes that her first dose was not well-tolerated but she is done okay with the second dose.  She is continuing to strive for increased activity and has been working on her diet.  She continues to take hydrochlorothiazide 25 mg daily, tolerating well without side effects.  Her blood pressure has been running well at home and she wonders if she may be able to come off this medication. Denies CP, SOB, palpitations, lower extremity edema, dizziness, headaches, or vision changes.  Having difficulty sleeping.  She does go to sleep without difficulty but wakes to 3 times nightly and has difficulty getting back to sleep.  She is using melatonin 6 mg nightly which does help with sleep onset.  Has not tried any other over-the-counter medications, prescription medications, or herbal options.  Is not sure she wants to start another medication.  Her mood has been doing very well with sertraline 50 mg daily.  She is tolerating medication well without side effects.  Denies significant depression or nausea symptoms.  Denies SI/HI.  I reviewed the past medical history, family history, social history, surgical history, and allergies today and no changes were needed.  Please see the problem list section below in epic for further details.  Past Medical History: Past Medical History:  Diagnosis Date   Acute respiratory failure ([place])    Diabetes mellitus without complication (HCC)    GERD (gastroesophageal reflux disease)    Hypertension    Menopausal vasomotor syndrome    Obesity     Pre-diabetes    RSV (respiratory syncytial virus pneumonia)    Past Surgical History: Past Surgical History:  Procedure Laterality Date   ANKLE ARTHROSCOPY Left 1985   COLONOSCOPY  2011   IN [place]=diverticulosis   DILATION AND CURETTAGE OF UTERUS N/A 08/14/2017   Procedure: DILATATION AND CURETTAGE;  Surgeon: Emily Filbert, MD;  Location: [facility];  Service: Gynecology;  Laterality: N/A;   REPLACEMENT TOTAL KNEE Right 2021   Social History: Social History   Socioeconomic History   Marital status: Married    Spouse name: Not on file   Number of children: 4   Years of education: Not on file   Highest education level: Not on file  Occupational History   Not on file  Tobacco Use   Smoking status: Never   Smokeless tobacco: Never  Vaping Use   Vaping Use: Never used  Substance and Sexual Activity   Alcohol use: Not Currently   Drug use: Never   Sexual activity: Yes    Birth control/protection: Post-menopausal  Other Topics Concern   Not on file  Social History Narrative   Lives at home with wife and teenage daughter   Social Determinants of Health   Financial Resource Strain: Not on file  Food Insecurity: Not on file  Transportation Needs: Not on file  Physical Activity: Not on file  Stress: Not on file  Social Connections: Not on file   Family History: Family History  Problem Relation Age of Onset   Heart disease Mother  Hypertension Mother    Heart disease Father    Colon polyps Father    Breast cancer Maternal Grandmother    Colon cancer Neg Hx    Esophageal cancer Neg Hx    Rectal cancer Neg Hx    Stomach cancer Neg Hx    Allergies: No Known Allergies Medications: See med rec.  Review of Systems: See HPI for pertinent positives and negatives.   Objective:    General: Well Developed, well nourished, and in no acute distress.  Neuro: Alert and oriented x3.  HEENT: Normocephalic, atraumatic.  Skin: Warm and dry. Cardiac:  Regular rate and rhythm, no murmurs rubs or gallops, no lower extremity edema.  Respiratory: Clear to auscultation bilaterally. Not using accessory muscles, speaking in full sentences.  Impression and Recommendations:    1. Class 1 obesity due to excess calories with serious comorbidity and body mass index (BMI) of 32.0 to 32.9 in adult She has done well and lost another 7 pounds on Wegovy.  Continue the medication at 2.4 mg weekly.  Continue diet and lifestyle modifications.  Work on improving protein intake as this is likely a cause of her slow progression. - Semaglutide-Weight Management 2.4 MG/0.75ML SOAJ; Inject 2.4 mg into the skin once a week.  Dispense: 9 mL; Refill: 1  2. Essential hypertension Blood pressure is on the low side of normal today at 103/69.  Advised patient to monitor blood pressure closely but okay to reduce dose to 12.5 mg for a few weeks to see if she tolerates this well.  We may be able to stop this medication since she has lost close to 30 pounds. - hydrochlorothiazide (HYDRODIURIL) 25 MG tablet; Take 1 tablet (25 mg total) by mouth daily.  Dispense: 90 tablet; Refill: 1  3. Major depressive disorder with single episode, in full remission (HCC) Continue sertraline 50 mg daily. - sertraline (ZOLOFT) 50 MG tablet; Take 1 tablet (50 mg total) by mouth daily.  Dispense: 90 tablet; Refill: 1  4.  Trouble sleeping Reviewed options for treatment of sleep maintenance concerns.  At this point she would like to avoid adding a different medication.  She will continue taking melatonin 6 mg nightly.  Advised her she can take up to 10 mg nightly safely.  Reviewed over-the-counter and herbal options to consider.   Return in about 6 months (around 02/09/2021) for mood/weight/HTN follow. ___________________________________________ Clearnce Sorrel, DNP, APRN, FNP-BC [facility]

## 2020-08-22 ENCOUNTER — Ambulatory Visit: Payer: 59 | Admitting: Medical-Surgical

## 2020-09-04 ENCOUNTER — Encounter: Payer: Self-pay | Admitting: Gastroenterology

## 2020-09-04 ENCOUNTER — Other Ambulatory Visit: Payer: Self-pay

## 2020-09-04 ENCOUNTER — Ambulatory Visit (INDEPENDENT_AMBULATORY_CARE_PROVIDER_SITE_OTHER): Payer: 59 | Admitting: Gastroenterology

## 2020-09-04 ENCOUNTER — Other Ambulatory Visit (INDEPENDENT_AMBULATORY_CARE_PROVIDER_SITE_OTHER): Payer: 59

## 2020-09-04 VITALS — BP 110/78 | HR 85 | Ht 64.0 in | Wt 198.0 lb

## 2020-09-04 DIAGNOSIS — K449 Diaphragmatic hernia without obstruction or gangrene: Secondary | ICD-10-CM

## 2020-09-04 DIAGNOSIS — K50919 Crohn's disease, unspecified, with unspecified complications: Secondary | ICD-10-CM

## 2020-09-04 DIAGNOSIS — K219 Gastro-esophageal reflux disease without esophagitis: Secondary | ICD-10-CM | POA: Diagnosis not present

## 2020-09-04 LAB — COMPREHENSIVE METABOLIC PANEL WITH GFR
ALT: 14 U/L (ref 0–35)
AST: 18 U/L (ref 0–37)
Albumin: 4.5 g/dL (ref 3.5–5.2)
Alkaline Phosphatase: 65 U/L (ref 39–117)
BUN: 14 mg/dL (ref 6–23)
CO2: 33 meq/L — ABNORMAL HIGH (ref 19–32)
Calcium: 9.9 mg/dL (ref 8.4–10.5)
Chloride: 99 meq/L (ref 96–112)
Creatinine, Ser: 0.77 mg/dL (ref 0.40–1.20)
GFR: 83.67 mL/min
Glucose, Bld: 93 mg/dL (ref 70–99)
Potassium: 4.6 meq/L (ref 3.5–5.1)
Sodium: 139 meq/L (ref 135–145)
Total Bilirubin: 0.7 mg/dL (ref 0.2–1.2)
Total Protein: 7 g/dL (ref 6.0–8.3)

## 2020-09-04 LAB — CBC WITH DIFFERENTIAL/PLATELET
Basophils Absolute: 0.1 10*3/uL (ref 0.0–0.1)
Basophils Relative: 0.9 % (ref 0.0–3.0)
Eosinophils Absolute: 0.1 10*3/uL (ref 0.0–0.7)
Eosinophils Relative: 1.6 % (ref 0.0–5.0)
HCT: 38.9 % (ref 36.0–46.0)
Hemoglobin: 12.8 g/dL (ref 12.0–15.0)
Lymphocytes Relative: 26.1 % (ref 12.0–46.0)
Lymphs Abs: 1.4 10*3/uL (ref 0.7–4.0)
MCHC: 33 g/dL (ref 30.0–36.0)
MCV: 78.7 fl (ref 78.0–100.0)
Monocytes Absolute: 0.4 10*3/uL (ref 0.1–1.0)
Monocytes Relative: 7.9 % (ref 3.0–12.0)
Neutro Abs: 3.5 10*3/uL (ref 1.4–7.7)
Neutrophils Relative %: 63.5 % (ref 43.0–77.0)
Platelets: 212 10*3/uL (ref 150.0–400.0)
RBC: 4.94 Mil/uL (ref 3.87–5.11)
RDW: 16.2 % — ABNORMAL HIGH (ref 11.5–15.5)
WBC: 5.5 10*3/uL (ref 4.0–10.5)

## 2020-09-04 LAB — C-REACTIVE PROTEIN: CRP: <1 mg/dL (ref 0.5–20.0)

## 2020-09-04 LAB — VITAMIN B12: Vitamin B-12: 303 pg/mL (ref 211–911)

## 2020-09-04 NOTE — Patient Instructions (Signed)
If you are age 61 or older, your body mass index should be between 23-30. Your Body mass index is 33.99 kg/m. If this is out of the aforementioned range listed, please consider follow up with your Primary Care Provider.  If you are age 1 or younger, your body mass index should be between 19-25. Your Body mass index is 33.99 kg/m. If this is out of the aformentioned range listed, please consider follow up with your Primary Care Provider.   __________________________________________________________  The [facility] providers would like to encourage you to use [facility] to communicate with providers for non-urgent requests or questions.  Due to long hold times on the telephone, sending your provider a message by [facility] may be a faster and more efficient way to get a response.  Please allow 48 business hours for a response.  Please remember that this is for non-urgent requests.   Please go to the lab on the 2nd floor [address] before you leave the office today.   Continue humira  Avoid NSAIDS.  Please get bone density scan done by PCP.  Thank you,  Dr. Jackquline Denmark

## 2020-09-04 NOTE — Progress Notes (Signed)
Chief Complaint: FU  Referring Provider:  Samuel Bouche, NP      ASSESSMENT AND PLAN;    #1. Ileal Crohn's Disease with stricture. Dx 03/2020 on colon. Assoc IDA.  Started Humira 07/2020 #2. GERD with large HH   Plan: - Check CBC, CMP, CRP, B12. - Continue humira 34m Q2weekly as maintenance. - Continue Protonix 40 mg p.o. QD - Avoid NSAIDs. - FU in 6 months. - Bone density scan and Shingrix vaccine from PCP.  She will discuss.    HPI:    Megan Longneckeris a 60y.o. female   FU   Doing great on Humira.  No GI complaints.  No further diarrhea.  No abdominal pain.  Given her symptoms of joint pains have more or less completely resolved.  She is pleased with the progress.  I have advised her to get bone density scan and Shingrix vaccine from PCP.  She has appointment coming up.  No further nausea or vomiting.  Past GI work-up:  Colonoscopy 04/10/2020 - Two 2 to 4 mm polyps in the proximal ascending colon and in the mid ascending colon, removed with a cold biopsy forceps. Resected and retrieved. - Moderate predominantly sigmoid diverticulosis. - Terminal ileitis with terminal ileal stricture-highly suggestive of Crohn's disease. - The examination was otherwise normal on direct and retroflexion views.  EGD 05/2020 - Large hiatal hernia. - Gastritis. Biopsied. - A single gastric polyp. Biopsied.  CTE 05/2020: 1.  CT Enterography of the abdomen and pelvis shows 2 areas of involvement of the ileum.  2.  Distal segment of ileum shows mucosal enhancement consistent with active disease. Incomplete distention of this area may be related to spasm. Structures not excluded. No evidence of obstruction.  3.  Mucosal enhancement region of the terminal ileum consistent with active disease. Incomplete distention of this area may be related to spasm. Structures not excluded. No evidence of obstruction.  4.  Possible active disease proximal portion of the appendix. No periappendiceal  inflammation or enlargement of the appendix.  5.  No evidence of fistula formation or abscess.  6.  Large sliding hiatal hernia.  Past Medical History:  Diagnosis Date   Acute respiratory failure (HCC)    [place]    [place]abetes [place] complication ([place]    GERD (gastroesophageal reflux disease)    Hypertension    Menopausal vasomotor syndrome    Obesity    Pre-diabetes    RSV (respiratory syncytial virus pneumonia)     Past Surgical History:  Procedure Laterality Date   ANKLE ARTHROSCOPY Left 1985   COLONOSCOPY  2011   IN [place]=diverticulosis   DILATION AND CURETTAGE OF UTERUS N/A 08/14/2017   Procedure: DILATATION AND CURETTAGE;  Surgeon: DEmily Filbert MD;  [place] [facility]  Service: Gynecology;  Laterality: N/A;   REPLACEMENT TOTAL KNEE Right 2021    Family History  Problem Relation Age of Onset   Heart disease Mother    Hypertension Mother    Heart disease Father    Colon polyps Father    Breast cancer Maternal Grandmother    Colon cancer Neg Hx    Esophageal cancer Neg Hx    Rectal cancer Neg Hx    Stomach cancer Neg Hx     Social History   Tobacco Use   Smoking status: Never   Smokeless tobacco: Never  Vaping Use   Vaping Use: Never used  Substance Use Topics   Alcohol use: Not Currently  Drug use: Never    Current Outpatient Medications  Medication Sig Dispense Refill   Adalimumab (HUMIRA) 40 MG/0.4ML PSKT Inject into the skin.     hydrochlorothiazide (HYDRODIURIL) 25 MG tablet Take 1 tablet (25 mg total) by mouth daily. 90 tablet 1   pantoprazole (PROTONIX) 40 MG tablet Take 1 tablet (40 mg total) by mouth daily. 90 tablet 4   Semaglutide-Weight Management 2.4 MG/0.75ML SOAJ Inject 2.4 mg into the skin once a week. 9 mL 1   sertraline (ZOLOFT) 50 MG tablet Take 1 tablet (50 mg total) by mouth daily. 90 tablet 1   No current facility-administered medications for this visit.    No Known  Allergies  Review of Systems:  neg     Physical Exam:    BP 110/78   Pulse 85   Ht 5' 4"  (1.626 m)   Wt 198 lb (89.8 kg)   SpO2 97%   BMI 33.99 kg/m  Wt Readings from Last 3 Encounters:  09/04/20 198 lb (89.8 kg)  08/10/20 191 lb 4.8 oz (86.8 kg)  06/05/20 198 lb (89.8 kg)   Constitutional:  Well-developed, in no acute distress. Psychiatric: Normal mood and affect. Behavior is normal. HEENT: Pupils normal.  Conjunctivae are normal. No scleral icterus. Abdominal: Soft, nondistended. Nontender. Bowel sounds active throughout. There are no masses palpable. No hepatomegaly. Rectal: Deferred Neurological: Alert and oriented to person place and time. Skin: Skin is warm and dry. No rashes noted.  Data Reviewed: I have personally reviewed following labs and imaging studies  CBC: CBC Latest Ref Rng & Units 05/25/2020 05/19/2019 09/16/2018  WBC 4.0 - 10.5 K/uL 4.5 5.2 5.3  Hemoglobin 12.0 - 15.0 g/dL 11.3(L) 10.9(L) 14.5  Hematocrit 36.0 - 46.0 % 34.5(L) 36.4 40.2  Platelets 150.0 - 400.0 K/uL 232.0 289 283    CMP: CMP Latest Ref Rng & Units 05/25/2020 05/19/2019 09/16/2018  Glucose 70 - 99 mg/dL 96 104(H) 110(H)  BUN 6 - 23 mg/dL 15 15 16   Creatinine 0.40 - 1.20 mg/dL 0.75 0.79 0.80  Sodium 135 - 145 mEq/L 140 139 140  Potassium 3.5 - 5.1 mEq/L 4.0 5.0 4.3  Chloride 96 - 112 mEq/L 97 101 102  CO2 19 - 32 mEq/L 35(H) 29 27  Calcium 8.4 - 10.5 mg/dL 9.8 9.7 9.8  Total Protein 6.0 - 8.3 g/dL 6.7 6.7 7.3  Total Bilirubin 0.2 - 1.2 mg/dL 0.6 0.5 0.6  Alkaline Phos 39 - 117 U/L 79 - -  AST 0 - 37 U/L 15 31 31   ALT 0 - 35 U/L 12 32(H) 31(H)      Radiology Studies: No results found.     Carmell Austria, MD 09/04/2020, 8:40 AM  Cc: Samuel Bouche, NP

## 2020-09-10 ENCOUNTER — Telehealth: Payer: Self-pay | Admitting: General Surgery

## 2020-09-10 NOTE — Telephone Encounter (Signed)
-----   Message from Jackquline Denmark, MD sent at 09/08/2020  6:23 PM EDT ----- Please inform the patient. Excellent labs All results normal or at baseline. Send report to family physician

## 2020-09-10 NOTE — Telephone Encounter (Signed)
Informed the patient her labs were normal and she verbalized understanding

## 2020-09-17 ENCOUNTER — Telehealth: Payer: Self-pay | Admitting: Gastroenterology

## 2020-09-17 MED ORDER — HUMIRA (2 SYRINGE) 40 MG/0.4ML ~~LOC~~ PSKT: 1.0000 "pen " | PREFILLED_SYRINGE | SUBCUTANEOUS | 10 refills | Status: DC

## 2020-09-17 NOTE — Telephone Encounter (Signed)
[facility]ication sent to [facility] based on phone number given with 10 refills

## 2020-09-26 IMAGING — DX DG CHEST 2V
2 series · 2 of 2 positions shown · non-contrast
Comparison: 09/07/2017 chest radiograph.

CLINICAL DATA: Acute lower respiratory infection. Cough for 2
weeks. Left lower lobe wheezing.

EXAM:
CHEST - 2 VIEW

[chest pa]
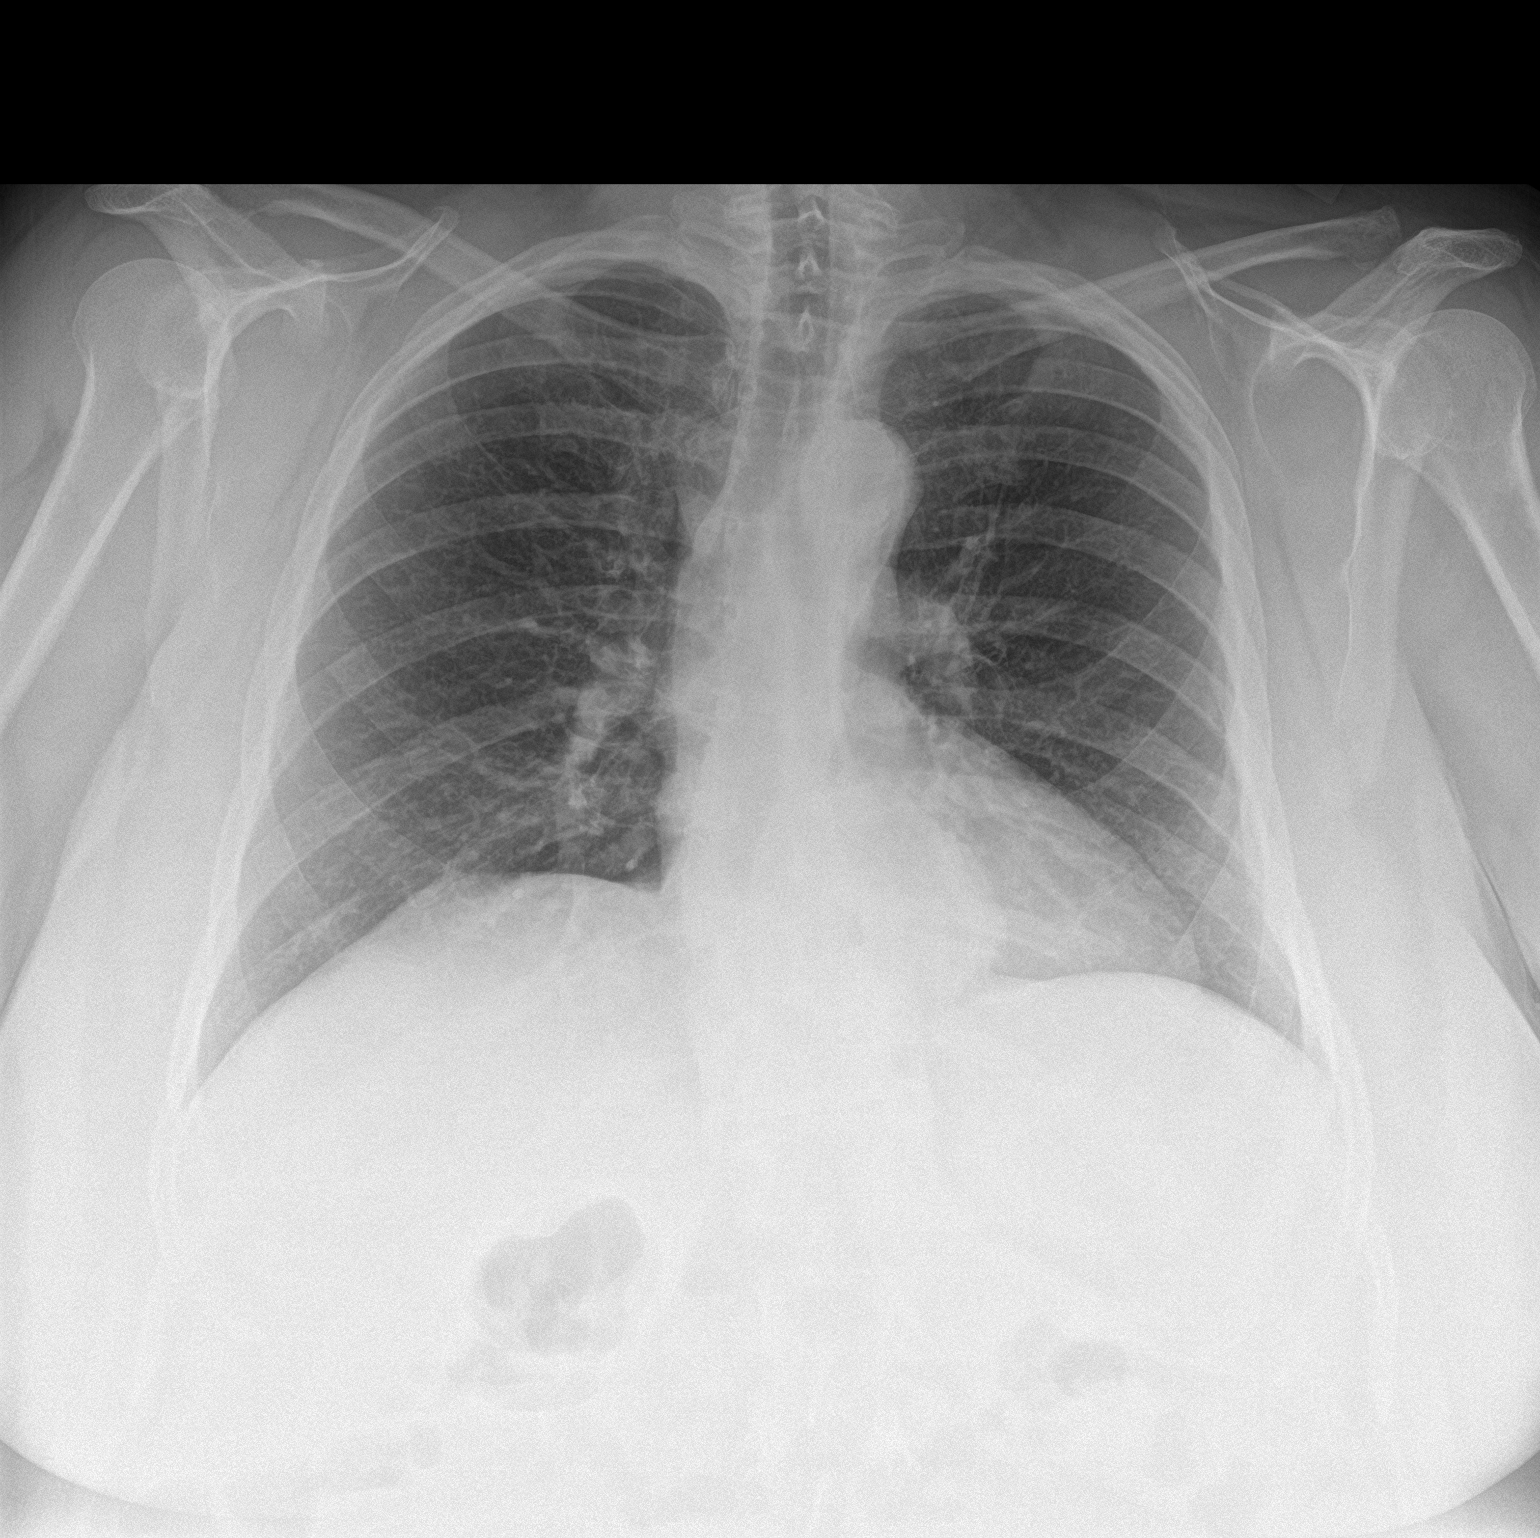

[chest lat]
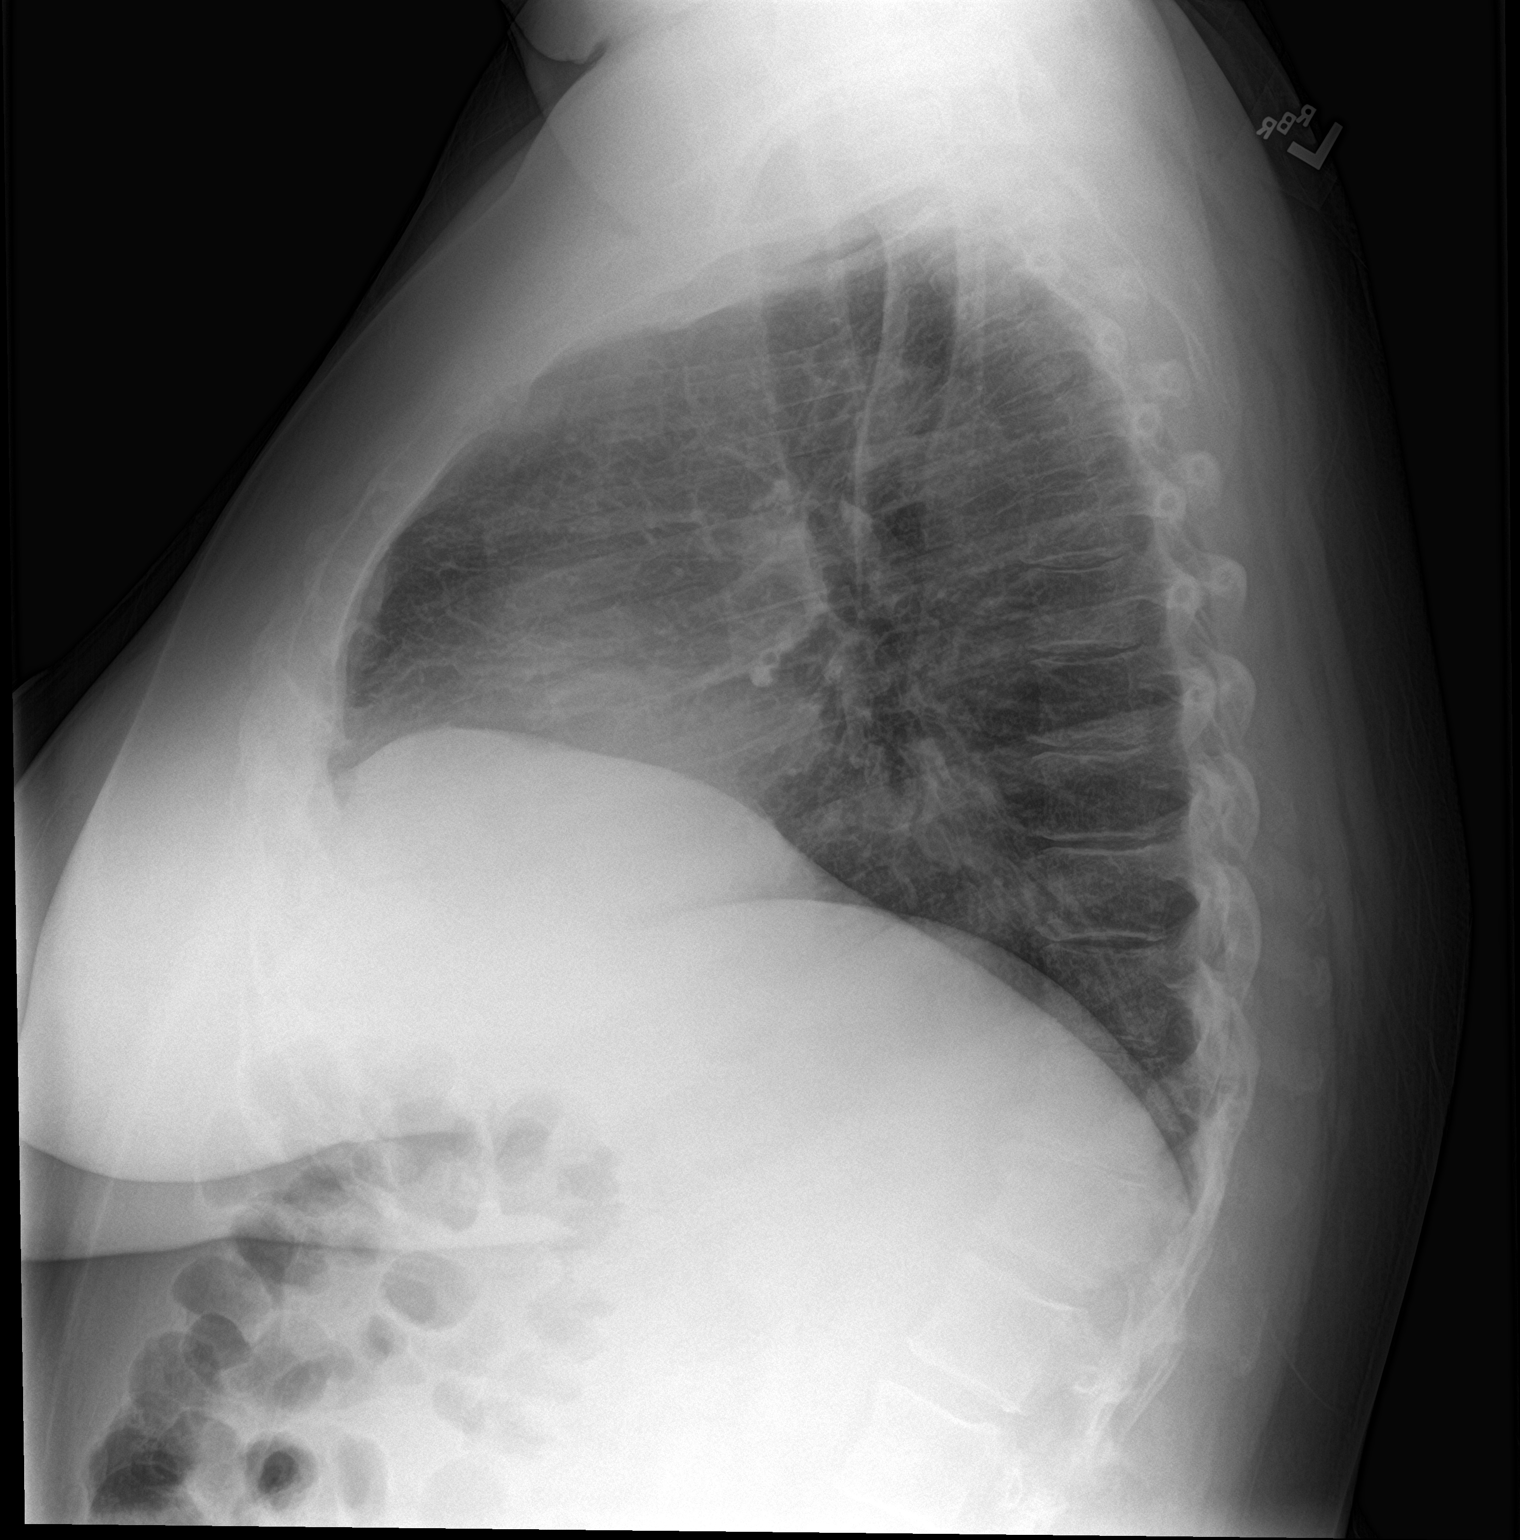

[2 of 2 positions shown; findings below may reference images not displayed]

FINDINGS: Stable cardiomediastinal silhouette with heart size and small to
moderate hiatal hernia. No pneumothorax. No pleural effusion. Lungs
appear clear, with no acute consolidative airspace disease and no
pulmonary edema.
IMPRESSION: 1. Stable small to moderate hiatal hernia.
2. No active cardiopulmonary disease.

## 2020-10-08 ENCOUNTER — Encounter: Payer: Self-pay | Admitting: Emergency Medicine

## 2020-10-08 ENCOUNTER — Other Ambulatory Visit: Payer: Self-pay

## 2020-10-08 ENCOUNTER — Emergency Department (INDEPENDENT_AMBULATORY_CARE_PROVIDER_SITE_OTHER)
Admission: EM | Admit: 2020-10-08 | Discharge: 2020-10-08 | Disposition: A | Discharge status: Home / Self Care | Payer: 59 | Source: Home / Self Care

## 2020-10-08 ENCOUNTER — Emergency Department (INDEPENDENT_AMBULATORY_CARE_PROVIDER_SITE_OTHER): Payer: 59

## 2020-10-08 DIAGNOSIS — M25511 Pain in right shoulder: Secondary | ICD-10-CM

## 2020-10-08 DIAGNOSIS — S46911A Strain of unspecified muscle, fascia and tendon at shoulder and upper arm level, right arm, initial encounter: Secondary | ICD-10-CM

## 2020-10-08 MED ORDER — BACLOFEN 10 MG PO TABS: 10.0000 mg | ORAL_TABLET | Freq: Three times a day (TID) | ORAL | 0 refills | Status: DC

## 2020-10-08 MED ORDER — PREDNISONE 20 MG PO TABS: ORAL_TABLET | ORAL | 0 refills | Status: DC

## 2020-10-08 MED ORDER — METHYLPREDNISOLONE SODIUM SUCC 125 MG IJ SOLR
125.0000 mg | Freq: Once | INTRAMUSCULAR | Status: AC
  Administered 2020-10-08: 125 mg via INTRAMUSCULAR

## 2020-10-08 NOTE — ED Provider Notes (Signed)
[name]    CSN: [id number] Arrival date & time: 10/08/20  7078      History   Chief Complaint Chief Complaint  Patient presents with   Shoulder Pain    HPI Megan [name] is a 60 y.o. female.   HPI 60 year old female presents with right shoulder pain x3 weeks.  Patient reports working in garden a few weeks ago lifting heavy boxes, with right shoulder pain getting worse over the past week.  Reports trying ice, heat, Biofreeze, Ibuprofen and Tylenol with little to no relief.  Past Medical History:  Diagnosis Date   Acute respiratory failure ([place])    Crohn's disease ([place])    Diabetes mellitus without complication ([place])    GERD (gastroesophageal reflux disease)    Hypertension    Menopausal vasomotor syndrome    Obesity    Pre-diabetes    RSV (respiratory syncytial virus pneumonia)     Patient Active Problem List   Diagnosis Date Noted   Right hip pain 67/54/4920   Metabolic syndrome 11/23/1217   Major depressive disorder with single episode, in full remission ([place]) 05/19/2019   Primary osteoarthritis of right knee 11/01/2018   Depressive disorder in remission ([place]) 04/20/2018   Encounter for weight management 03/02/2018   Hearing loss of right ear 02/23/2018   Hiatal hernia with GERD 01/11/2018   Postmenopausal 10/07/2017   Encounter for medication management 10/07/2017   Atelectasis of left lung 09/08/2017   Pruritus 09/07/2017   Uterine leiomyoma 07/17/2017   Abnormal uterine bleeding (AUB) 07/17/2017   Prediabetes 07/09/2017   Anxiety and depression 06/15/2017   Menopausal vasomotor syndrome 06/04/2017   Gastroesophageal reflux disease without esophagitis 03/07/2014   Insomnia 02/06/2012   Hyperlipidemia 07/29/2011   Essential hypertension 01/28/2011   Class 1 obesity due to excess calories with serious comorbidity in adult 01/28/2011    Past Surgical History:  Procedure Laterality Date   ANKLE ARTHROSCOPY Left 1985   COLONOSCOPY  2011    IN [place]=diverticulosis   DILATION AND CURETTAGE OF UTERUS N/A 08/14/2017   Procedure: DILATATION AND CURETTAGE;  Surgeon: Emily Filbert, MD;  Location: [facility];  Service: Gynecology;  Laterality: N/A;   REPLACEMENT TOTAL KNEE Right 2021    OB History     Gravida  0   Para  0   Term  0   Preterm  0   AB  0   Living  0      SAB  0   IAB  0   Ectopic  0   Multiple  0   Live Births  0            Home Medications    Prior to Admission medications   Medication Sig Start Date End Date Taking? Authorizing Provider  baclofen (LIORESAL) 10 MG tablet Take 1 tablet (10 mg total) by mouth 3 (three) times daily. 10/08/20  Yes Eliezer Lofts, FNP  predniSONE (DELTASONE) 20 MG tablet Take 3 tabs PO daily x 3 days, then 2 tabs PO daily x 3 days, then 1 tab PO daily x 3 days 10/08/20  Yes Eliezer Lofts, FNP  Adalimumab (HUMIRA) 40 MG/0.4ML PSKT Inject 1 pen into the skin every 14 (fourteen) days. 09/17/20   Jackquline Denmark, MD  hydrochlorothiazide (HYDRODIURIL) 25 MG tablet Take 1 tablet (25 mg total) by mouth daily. 08/10/20   Samuel Bouche, NP  pantoprazole (PROTONIX) 40 MG tablet Take 1 tablet (40 mg total) by mouth daily. 06/05/20  Jackquline Denmark, MD  Semaglutide-Weight Management 2.4 MG/0.75ML SOAJ Inject 2.4 mg into the skin once a week. 08/10/20   Samuel Bouche, NP  sertraline (ZOLOFT) 50 MG tablet Take 1 tablet (50 mg total) by mouth daily. 08/10/20   Samuel Bouche, NP    Family History Family History  Problem Relation Age of Onset   Heart disease Mother    Hypertension Mother    Heart disease Father    Colon polyps Father    Breast cancer Maternal Grandmother    Colon cancer Neg Hx    Esophageal cancer Neg Hx    Rectal cancer Neg Hx    Stomach cancer Neg Hx     Social History Social History   Tobacco Use   Smoking status: Never   Smokeless tobacco: Never  Vaping Use   Vaping Use: Never used  Substance Use Topics   Alcohol use: Not Currently    Drug use: Never     Allergies   Patient has no known allergies.   Review of Systems Review of Systems  Musculoskeletal:        Right shoulder pain x 3 weeks    Physical Exam Triage Vital Signs ED Triage Vitals  Enc Vitals Group     BP 10/08/20 0829 100/69     Pulse Rate 10/08/20 0829 (!) 103     Resp 10/08/20 0829 18     Temp 10/08/20 0829 98.4 F (36.9 C)     Temp Source 10/08/20 0829 Oral     SpO2 10/08/20 0829 95 %     Weight 10/08/20 0830 201 lb (91.2 kg)     Height 10/08/20 0830 5' 4"  (1.626 m)     Head Circumference --      Peak Flow --      Pain Score 10/08/20 0830 10     Pain Loc --      Pain Edu? --      Excl. in [place]? --    No data found.  Updated Vital Signs BP 100/69 (BP Location: Right Arm)   Pulse (!) 103   Temp 98.4 F (36.9 C) (Oral)   Resp 18   Ht 5' 4"  (1.626 m)   Wt 201 lb (91.2 kg)   SpO2 95%   BMI 34.50 kg/m       Physical Exam Vitals and nursing note reviewed.  Constitutional:      General: She is not in acute distress.    Appearance: Normal appearance. She is obese. She is not ill-appearing.  HENT:     Head: Normocephalic and atraumatic.     Nose: Nose normal.     Mouth/Throat:     Mouth: Mucous membranes are moist.     Pharynx: Oropharynx is clear.  Eyes:     Extraocular Movements: Extraocular movements intact.     Conjunctiva/sclera: Conjunctivae normal.     Pupils: Pupils are equal, round, and reactive to light.  Cardiovascular:     Rate and Rhythm: Normal rate and regular rhythm.     Pulses: Normal pulses.     Heart sounds: Normal heart sounds.  Pulmonary:     Effort: Pulmonary effort is normal.     Breath sounds: Normal breath sounds. No wheezing, rhonchi or rales.  Musculoskeletal:        General: Tenderness present. No deformity or signs of injury.     Cervical back: Normal range of motion and neck supple. No rigidity.     Comments: Right shoulder: TTP  over [facility] joint, limited range of motion with  flexion/extension, internal/external rotation, horizontal abduction, and scapular retraction  Skin:    General: Skin is warm and dry.  Neurological:     General: No focal deficit present.     Mental Status: She is alert and oriented to person, place, and time. Mental status is at baseline.  Psychiatric:        Mood and Affect: Mood normal.        Behavior: Behavior normal.        Thought Content: Thought content normal.     UC Treatments / Results  Labs (all labs ordered are listed, but only abnormal results are displayed) Labs Reviewed - No data to display  EKG   Radiology DG Shoulder Right  Result Date: 10/08/2020 CLINICAL DATA:  Acute right shoulder pain after lifting heavy boxes 3 weeks ago. EXAM: RIGHT SHOULDER - 2+ VIEW COMPARISON:  None. FINDINGS: There is no evidence of fracture or dislocation. There is no evidence of arthropathy or other focal bone abnormality. Soft tissues are unremarkable. IMPRESSION: Negative. Electronically Signed   By: Marijo Conception M.D.   On: 10/08/2020 09:30    Procedures Procedures (including critical care time)  Medications Ordered in UC Medications  methylPREDNISolone sodium succinate (SOLU-MEDROL) 125 mg/2 mL injection 125 mg (125 mg Intramuscular Given 10/08/20 0954)    Initial Impression / Assessment and Plan / UC Course  I have reviewed the triage vital signs and the nursing notes.  Pertinent labs & imaging results that were available during my care of the patient were reviewed by me and considered in my medical decision making (see chart for details).     MDM: 1.  Right shoulder pain-xray of right shoulder was within normal limits, IM Solu-Medrol 125 given once in clinic prior to discharge; 2.Strain of right shoulder, initial encounter Rx'd prednisone taper and baclofen, Advised/instructed patient to start oral prednisone taper tomorrow, Tuesday, 10/09/2020.  Advised patient to take medication as directed with food to completion.   Encouraged patient to increase daily water intake while taking medication.  Advised/encouraged patient to avoid moderate to strenuous activities; including repetitive motion activities involving right shoulder for the next 7-10 days.  Patient discharged home, hemodynamically stable. Final Clinical Impressions(s) / UC Diagnoses   Final diagnoses:  Strain of right shoulder, initial encounter  Acute pain of right shoulder     Discharge Instructions      Advised/instructed patient to start oral prednisone taper tomorrow, Tuesday, 10/09/2020.  Advised patient to take medication as directed with food to completion.  Encouraged patient to increase daily water intake while taking medication.  Advised/encouraged patient to avoid moderate to strenuous activities; including repetitive motion activities involving right shoulder for the next 7-10 days.     ED Prescriptions     Medication Sig Dispense Auth. Provider   predniSONE (DELTASONE) 20 MG tablet Take 3 tabs PO daily x 3 days, then 2 tabs PO daily x 3 days, then 1 tab PO daily x 3 days 18 tablet Eliezer Lofts, FNP   baclofen (LIORESAL) 10 MG tablet Take 1 tablet (10 mg total) by mouth 3 (three) times daily. 16 each Eliezer Lofts, FNP      PDMP not reviewed this encounter.   Eliezer Lofts, [place] 10/08/20 [phone]

## 2020-10-08 NOTE — Discharge Instructions (Addendum)
Advised/instructed patient to start oral prednisone taper tomorrow, Tuesday, 10/09/2020.  Advised patient to take medication as directed with food to completion.  Encouraged patient to increase daily water intake while taking medication.  Advised/encouraged patient to avoid moderate to strenuous activities; including repetitive motion activities involving right shoulder for the next 7-10 days.

## 2020-10-08 NOTE — ED Triage Notes (Signed)
RT shoulder pain x 3 weeks, working in garden a few weeks ago lifting heavy boxes, pain has been getting worse, tried ice, heat, Bio Freeze, Ibuprofen, Tylenol.

## 2020-10-30 ENCOUNTER — Ambulatory Visit (INDEPENDENT_AMBULATORY_CARE_PROVIDER_SITE_OTHER): Payer: 59

## 2020-10-30 ENCOUNTER — Other Ambulatory Visit: Payer: Self-pay

## 2020-10-30 ENCOUNTER — Ambulatory Visit (INDEPENDENT_AMBULATORY_CARE_PROVIDER_SITE_OTHER): Payer: 59 | Admitting: Sports Medicine

## 2020-10-30 DIAGNOSIS — G8929 Other chronic pain: Secondary | ICD-10-CM

## 2020-10-30 DIAGNOSIS — M25511 Pain in right shoulder: Secondary | ICD-10-CM

## 2020-10-30 DIAGNOSIS — M75101 Unspecified rotator cuff tear or rupture of right shoulder, not specified as traumatic: Secondary | ICD-10-CM | POA: Insufficient documentation

## 2020-10-30 NOTE — Assessment & Plan Note (Signed)
This is a pleasant 60 year old female, she has a several month history of pain in her right shoulder after lifting several heavy boxes of produce. Pain is anterior, worse with abduction, flexion. On exam she has positive speeds test, dominant findings however are positive empty can sign, Neer's and Hawkins signs, she has significant weakness to abduction concerning for cuff tear. Considering her severe pain we did a subacromial injection today, adding some physical therapy, return to see me in 6 weeks, MRI if no better.

## 2020-10-30 NOTE — Progress Notes (Addendum)
    Procedures performed today:    Procedure: Real-time Ultrasound Guided injection of the right subacromial bursa Device: Samsung HS60  Verbal informed consent obtained.  Time-out conducted.  Noted no overlying erythema, induration, or other signs of local infection.  Skin prepped in a sterile fashion.  Local anesthesia: Topical Ethyl chloride.  With sterile technique and under real time ultrasound guidance: Noted intact cuff, 1 cc Kenalog 40, 1 cc lidocaine, 1 cc bupivacaine injected easily Completed without difficulty  Advised to call if fevers/chills, erythema, induration, drainage, or persistent bleeding.  Images permanently stored and available for review in PACS.  Impression: Technically successful ultrasound guided injection.  Independent interpretation of notes and tests performed by another provider:   None.  Brief History, Exam, Impression, and Recommendations:    Chronic right shoulder pain This is a pleasant 60 year old female, she has a several month history of pain in her right shoulder after lifting several heavy boxes of produce. Pain is anterior, worse with abduction, flexion. On exam she has positive speeds test, dominant findings however are positive empty can sign, Neer's and Hawkins signs, she has significant weakness to abduction concerning for cuff tear. Considering her severe pain we did a subacromial injection today, adding some physical therapy, return to see me in 6 weeks, MRI if no better.    ___________________________________________ Gwen Her. Dianah Field, M.D., ABFM., CAQSM. Primary Care and [place] [employer] [place] [employer] [facility] [employer]

## 2020-10-31 ENCOUNTER — Ambulatory Visit: Payer: 59 | Admitting: Sports Medicine

## 2020-11-05 ENCOUNTER — Ambulatory Visit: Payer: 59 | Admitting: Physical Therapy

## 2020-11-05 ENCOUNTER — Encounter: Payer: Self-pay | Admitting: Physical Therapy

## 2020-11-05 ENCOUNTER — Other Ambulatory Visit: Payer: Self-pay

## 2020-11-05 DIAGNOSIS — M6281 Muscle weakness (generalized): Secondary | ICD-10-CM | POA: Diagnosis not present

## 2020-11-05 DIAGNOSIS — M25511 Pain in right shoulder: Secondary | ICD-10-CM

## 2020-11-05 DIAGNOSIS — R29898 Other symptoms and signs involving the musculoskeletal system: Secondary | ICD-10-CM | POA: Diagnosis not present

## 2020-11-05 DIAGNOSIS — G8929 Other chronic pain: Secondary | ICD-10-CM

## 2020-11-05 NOTE — Therapy (Signed)
[facility] [place], [place], [address] Phone: [phone]   Fax:  [phone]  Physical Therapy Evaluation  Patient Details  Name: Megan [name] MRN: [id number] Date of Birth: [birth year] Referring Provider (PT): Thekkekandam   Encounter Date: 11/05/2020   PT End of Session - 11/05/20 0845     Visit Number 1    Number of Visits 12    Date for PT Re-Evaluation 12/17/20    PT Start Time 0800    PT Stop Time 0842    PT Time Calculation (min) 42 min             Past Medical History:  Diagnosis Date   Acute respiratory failure ([place])    Crohn's disease ([place])    Diabetes mellitus without complication ([place])    GERD (gastroesophageal reflux disease)    Hypertension    Menopausal vasomotor syndrome    Obesity    Pre-diabetes    RSV (respiratory syncytial virus pneumonia)     Past Surgical History:  Procedure Laterality Date   ANKLE ARTHROSCOPY Left 1985   COLONOSCOPY  2011   IN [place]=diverticulosis   DILATION AND CURETTAGE OF UTERUS N/A 08/14/2017   Procedure: DILATATION AND CURETTAGE;  Surgeon: Emily Filbert, MD;  Location: [facility];  Service: Gynecology;  Laterality: N/A;   REPLACEMENT TOTAL KNEE Right 2021    There were no vitals filed for this visit.    Subjective Assessment - 11/05/20 0800     Subjective Pt states that 2-3 months ago she was carrying large bukets of produce and noticed an onset of Rt shoulder and arm pain. The pain did not go away so eventually pt went to urgent care where she got a steroid shot which helped for a few days, then she went to see MD who did a cortisone injection and recommended PT.  Rt shoulder pain increases with lifting and stretching, improves with ice.    Limitations Lifting;House hold activities    Diagnostic tests x ray negative for fracture    Patient Stated Goals decrease pain and improve use of arm    Currently in Pain? Yes     Pain Score 2     Pain Location Shoulder    Pain Orientation Right    Pain Descriptors / Indicators Sore    Pain Type Chronic pain    Pain Onset More than a month ago    Pain Frequency Intermittent    Aggravating Factors  lift, push/pull    Pain Relieving Factors ice                [facility] PT Assessment - 11/05/20 0001       Assessment   Medical Diagnosis chronic Rt shoulder pain    Referring Provider (PT) Thekkekandam    Onset Date/Surgical Date 08/27/20    Hand Dominance Right    Next MD Visit 12/10/20      Precautions   Precautions None      Restrictions   Weight Bearing Restrictions No      Balance Screen   Has the patient fallen in the past 6 months No      Prior Function   Level of Independence Independent      Observation/Other Assessments   Focus on Therapeutic Outcomes (FOTO)  54      ROM / Strength   AROM / PROM / Strength AROM;Strength      AROM   AROM Assessment Site Shoulder  Right/Left Shoulder Right;Left    Right Shoulder Flexion 130 Degrees    Right Shoulder ABduction 88 Degrees    Right Shoulder Internal Rotation 44 Degrees    Right Shoulder External Rotation 90 Degrees    Left Shoulder Flexion 170 Degrees    Left Shoulder ABduction 170 Degrees    Left Shoulder Internal Rotation 90 Degrees    Left Shoulder External Rotation 90 Degrees      Strength   Overall Strength Comments bilat elbow flex and ext 4+/5    Strength Assessment Site Shoulder    Right/Left Shoulder Right;Left    Right Shoulder Flexion 3-/5    Right Shoulder ABduction 3-/5    Right Shoulder Internal Rotation --   pain with isometric activation   Left Shoulder Flexion 4/5    Left Shoulder ABduction 4/5      Palpation   Palpation comment TTP Rt anterior shoulder and biceps      Special Tests   Other special tests empty can positive Rt                        Objective measurements completed on examination: See above findings.       [place] Adult PT  Treatment/Exercise - 11/05/20 0001       Exercises   Exercises Shoulder      Shoulder Exercises: Standing   Flexion AAROM;5 reps    Flexion Limitations with cane    ABduction AAROM;5 reps    ABduction Limitations with cane      Shoulder Exercises: Isometric Strengthening   Flexion 3X5"    Extension 3X5"    External Rotation 3X5"    Internal Rotation Limitations deferred due to pain    ABduction 3X5"      Modalities   Modalities Vasopneumatic      Vasopneumatic   Number Minutes Vasopneumatic  10 minutes    Vasopnuematic Location  Shoulder    Vasopneumatic Pressure Low    Vasopneumatic Temperature  34                     PT Education - 11/05/20 0834     Education Details PT POC and goals, HEP    Person(s) Educated Patient    Methods Explanation;Demonstration;Handout    Comprehension Verbalized understanding;Returned demonstration                 PT Long Term Goals - 11/05/20 0906       PT LONG TERM GOAL #1   Title Pt will be independent with HEP    Time 6    Period Weeks    Status New    Target Date 12/17/20      PT LONG TERM GOAL #2   Title Pt will improve FOTO to >= 69 to demo improved functional mobility    Time 6    Period Weeks    Status New    Target Date 12/17/20      PT LONG TERM GOAL #3   Title Pt will improve Rt UE strength to 4/5 to improve ability to lift and carry with decreased pain    Time 6    Period Weeks    Status New    Target Date 12/17/20      PT LONG TERM GOAL #4   Title Pt will improve Rt shoulder flexion to 160 and abduction to 130 to improve functional activity tolerance    Time 6  Period Weeks    Status New    Target Date 12/17/20                    Plan - 11/05/20 0851     Clinical Impression Statement Pt is a 60 y/o female referred for Rt shoulder pain. She presents with decreased strength, ROM and activity tolerance, increased pain and decreased functional mobility. pt will benefit from  skilled PT to address deficits and improve functional mobility    Personal Factors and Comorbidities Past/Current Experience;Time since onset of injury/illness/exacerbation    Examination-Activity Limitations Lift;Reach Overhead;Carry    Examination-Participation Restrictions Yard Work;Cleaning    Stability/Clinical Decision Making Stable/Uncomplicated    Clinical Decision Making Low    Rehab Potential Good    PT Frequency 2x / week    PT Duration 6 weeks    PT Treatment/Interventions Vasopneumatic Device;Cryotherapy;Aquatic Therapy;Moist Heat;Iontophoresis 43m/ml Dexamethasone;Electrical Stimulation;Therapeutic activities;Therapeutic exercise;Patient/family education;Manual techniques;Passive range of motion;Dry needling;Taping    PT Next Visit Plan assess HEP, progress strength and ROM as tolerated    PT Home Exercise Plan [email or web address]   Consulted and Agree with Plan of Care Patient             Patient will benefit from skilled therapeutic intervention in order to improve the following deficits and impairments:  Pain, Impaired UE functional use, Impaired flexibility, Decreased strength, Decreased activity tolerance, Decreased range of motion  Visit Diagnosis: Chronic right shoulder pain - Plan: PT plan of care cert/re-cert  Muscle weakness (generalized) - Plan: PT plan of care cert/re-cert  Other symptoms and signs involving the musculoskeletal system - Plan: PT plan of care cert/re-cert     Problem List Patient Active Problem List   Diagnosis Date Noted   Chronic right shoulder pain 10/30/2020   Right hip pain 055/37/4827  Metabolic syndrome 007/86/7544  Major depressive disorder with single episode, in full remission ([place] 05/19/2019   Primary osteoarthritis of right knee 11/01/2018   Depressive disorder in remission ([place] 04/20/2018   Encounter for weight management 03/02/2018   Hearing loss of right ear 02/23/2018   Hiatal hernia with GERD 01/11/2018   Postmenopausal  10/07/2017   Encounter for medication management 10/07/2017   Atelectasis of left lung 09/08/2017   Pruritus 09/07/2017   Uterine leiomyoma 07/17/2017   Abnormal uterine bleeding (AUB) 07/17/2017   Prediabetes 07/09/2017   Anxiety and depression 06/15/2017   Menopausal vasomotor syndrome 06/04/2017   Gastroesophageal reflux disease without esophagitis 03/07/2014   Insomnia 02/06/2012   Hyperlipidemia 07/29/2011   Essential hypertension 01/28/2011   Class 1 obesity due to excess calories with serious comorbidity in adult 01/28/2011    Zareya Tuckett, PT 11/05/2020, 9:41 AM  [facility] [place] [address]Phone: [phone]  Fax:  [phone] Name: Megan PoussonMRN: [id number] of Birth: 112-02-1960

## 2020-11-05 NOTE — Patient Instructions (Signed)
Access Code: [email or web address] URL: [email or web address] Date: 11/05/2020 Prepared by: Isabelle Course  Exercises Standing Isometric Shoulder Internal Rotation at Doorway - 1 x daily - 7 x weekly - 1 sets - 10 reps - 3-5 sec hold Isometric Shoulder Flexion at Wall - 1 x daily - 7 x weekly - 1 sets - 10 reps - 3-5 sec hold Isometric Shoulder Extension at Wall - 1 x daily - 7 x weekly - 1 sets - 10 reps - 3-5 sec hold Isometric Shoulder External Rotation at Wall - 1 x daily - 7 x weekly - 1 sets - 10 reps - 3-5 sec hold Isometric Shoulder Abduction at Wall - 1 x daily - 7 x weekly - 1 sets - 10 reps - 3-5 sec hold Standing Shoulder Abduction AAROM with Dowel - 1 x daily - 7 x weekly - 1 sets - 10 reps - 3-5 sec hold Standing Shoulder Flexion AAROM with Dowel - 1 x daily - 7 x weekly - 1 sets - 10 reps - 3-5 sec hold

## 2020-11-08 ENCOUNTER — Ambulatory Visit (INDEPENDENT_AMBULATORY_CARE_PROVIDER_SITE_OTHER): Payer: 59 | Admitting: Physical Therapy

## 2020-11-08 ENCOUNTER — Other Ambulatory Visit: Payer: Self-pay

## 2020-11-08 DIAGNOSIS — R29898 Other symptoms and signs involving the musculoskeletal system: Secondary | ICD-10-CM

## 2020-11-08 DIAGNOSIS — G8929 Other chronic pain: Secondary | ICD-10-CM | POA: Diagnosis not present

## 2020-11-08 DIAGNOSIS — M6281 Muscle weakness (generalized): Secondary | ICD-10-CM | POA: Diagnosis not present

## 2020-11-08 DIAGNOSIS — M25511 Pain in right shoulder: Secondary | ICD-10-CM | POA: Diagnosis not present

## 2020-11-08 NOTE — Therapy (Signed)
[place], [place], [address] Phone: [phone]   Fax:  [phone]  Physical Therapy Treatment  Patient Details  Name: Megan [name] MRN: [id number] Date of Birth: August 11, 1960 Referring Provider (PT): Thekkekandam   Encounter Date: 11/08/2020   PT End of Session - 11/08/20 1012     Visit Number 2    Number of Visits 12    Date for PT Re-Evaluation 12/17/20    PT Start Time 0930    PT Stop Time 6073    PT Time Calculation (min) 45 min    Activity Tolerance Patient tolerated treatment well    Behavior During Therapy [facility] for tasks assessed/performed             Past Medical History:  Diagnosis Date   Acute respiratory failure ([place])    Crohn's disease ([place])    Diabetes mellitus without complication ([place])    GERD (gastroesophageal reflux disease)    Hypertension    Menopausal vasomotor syndrome    Obesity    Pre-diabetes    RSV (respiratory syncytial virus pneumonia)     Past Surgical History:  Procedure Laterality Date   ANKLE ARTHROSCOPY Left 1985   COLONOSCOPY  2011   IN [place]=diverticulosis   DILATION AND CURETTAGE OF UTERUS N/A 08/14/2017   Procedure: DILATATION AND CURETTAGE;  Surgeon: Emily Filbert, MD;  Location: [facility];  Service: Gynecology;  Laterality: N/A;   REPLACEMENT TOTAL KNEE Right 2021    There were no vitals filed for this visit.   Subjective Assessment - 11/08/20 0935     Subjective Pt states her shoulder feels "tender". She says she has been trying HEP    Patient Stated Goals decrease pain and improve use of arm    Currently in Pain? Yes    Pain Score 5     Pain Location Shoulder    Pain Orientation Right                [facility] PT Assessment - 11/08/20 0001       Assessment   Medical Diagnosis chronic Rt shoulder pain    Referring Provider (PT) Thekkekandam    Onset Date/Surgical Date 08/27/20    Hand Dominance Right     Next MD Visit 12/10/20                           [facility] Adult PT Treatment/Exercise - 11/08/20 0001       Shoulder Exercises: Standing   ABduction AAROM;10 reps    Extension 15 reps    Theraband Level (Shoulder Extension) Level 2 (Red)    Row 15 reps    Theraband Level (Shoulder Row) Level 2 (Red)    Row Limitations cues for posture      Shoulder Exercises: Pulleys   Flexion 2 minutes    ABduction 2 minutes      Shoulder Exercises: Isometric Strengthening   Flexion 5X5"    Extension 5X5"    External Rotation 5X5"    ABduction 5X5"      Vasopneumatic   Number Minutes Vasopneumatic  10 minutes    Vasopnuematic Location  Shoulder    Vasopneumatic Pressure Low    Vasopneumatic Temperature  34      Manual Therapy   Manual Therapy Passive ROM;Soft tissue mobilization    Soft tissue mobilization STM posterior shoulder mm to reduce pain and tightness    Passive ROM  PROM Rt shoudler flexion, abduction and IR                     PT Education - 11/08/20 1011     Education Details updated HEP    Person(s) Educated Patient    Methods Explanation;Demonstration;Handout    Comprehension Returned demonstration;Verbalized understanding                 PT Long Term Goals - 11/05/20 0906       PT LONG TERM GOAL #1   Title Pt will be independent with HEP    Time 6    Period Weeks    Status New    Target Date 12/17/20      PT LONG TERM GOAL #2   Title Pt will improve FOTO to >= 69 to demo improved functional mobility    Time 6    Period Weeks    Status New    Target Date 12/17/20      PT LONG TERM GOAL #3   Title Pt will improve Rt UE strength to 4/5 to improve ability to lift and carry with decreased pain    Time 6    Period Weeks    Status New    Target Date 12/17/20      PT LONG TERM GOAL #4   Title Pt will improve Rt shoulder flexion to 160 and abduction to 130 to improve functional activity tolerance    Time 6    Period  Weeks    Status New    Target Date 12/17/20                   Plan - 11/08/20 1014     Clinical Impression Statement Pt continues wiht pain with isometric IR and PROM into IR. Good tolerance to rows and shoulder extensions for posterior shoulder girdle strength    PT Next Visit Plan progress strength and ROM as tolerated    PT Home Exercise Plan [email or web address]    Consulted and Agree with Plan of Care Patient             Patient will benefit from skilled therapeutic intervention in order to improve the following deficits and impairments:     Visit Diagnosis: Muscle weakness (generalized)  Chronic right shoulder pain  Other symptoms and signs involving the musculoskeletal system     Problem List Patient Active Problem List   Diagnosis Date Noted   Chronic right shoulder pain 10/30/2020   Right hip pain 81/27/5170   Metabolic syndrome 01/74/9449   Major depressive disorder with single episode, in full remission ([place]) 05/19/2019   Primary osteoarthritis of right knee 11/01/2018   Depressive disorder in remission ([place]) 04/20/2018   Encounter for weight management 03/02/2018   Hearing loss of right ear 02/23/2018   Hiatal hernia with GERD 01/11/2018   Postmenopausal 10/07/2017   Encounter for medication management 10/07/2017   Atelectasis of left lung 09/08/2017   Pruritus 09/07/2017   Uterine leiomyoma 07/17/2017   Abnormal uterine bleeding (AUB) 07/17/2017   Prediabetes 07/09/2017   Anxiety and depression 06/15/2017   Menopausal vasomotor syndrome 06/04/2017   Gastroesophageal reflux disease without esophagitis 03/07/2014   Insomnia 02/06/2012   Hyperlipidemia 07/29/2011   Essential hypertension 01/28/2011   Class 1 obesity due to excess calories with serious comorbidity in adult 01/28/2011    Jazzmyne Rasnick, PT 11/08/2020, 10:15 AM  [facility] [place], [place], [address]  Phone:  [phone]   Fax:  [phone]  Name: Megan [name] MRN: [id number] Date of Birth: 1960-08-13

## 2020-11-08 NOTE — Patient Instructions (Signed)
Access Code: [email or web address] URL: [email or web address] Date: 11/08/2020 Prepared by: Isabelle Course  Exercises Isometric Shoulder Flexion at Wall - 1 x daily - 7 x weekly - 1 sets - 10 reps - 3-5 sec hold Isometric Shoulder Extension at Wall - 1 x daily - 7 x weekly - 1 sets - 10 reps - 3-5 sec hold Isometric Shoulder External Rotation at Wall - 1 x daily - 7 x weekly - 1 sets - 10 reps - 3-5 sec hold Isometric Shoulder Abduction at Wall - 1 x daily - 7 x weekly - 1 sets - 10 reps - 3-5 sec hold Standing Shoulder Abduction AAROM with Dowel - 1 x daily - 7 x weekly - 1 sets - 10 reps - 3-5 sec hold Standing Shoulder Flexion AAROM with Dowel - 1 x daily - 7 x weekly - 1 sets - 10 reps - 3-5 sec hold Standing Bilateral Low Shoulder Row with Anchored Resistance - 1 x daily - 7 x weekly - 2 sets - 10 reps Shoulder extension with resistance - Neutral - 1 x daily - 7 x weekly - 2 sets - 10 reps

## 2020-11-12 ENCOUNTER — Other Ambulatory Visit: Payer: Self-pay

## 2020-11-12 ENCOUNTER — Ambulatory Visit (INDEPENDENT_AMBULATORY_CARE_PROVIDER_SITE_OTHER): Payer: 59 | Admitting: Physical Therapy

## 2020-11-12 ENCOUNTER — Encounter: Payer: Self-pay | Admitting: Physical Therapy

## 2020-11-12 DIAGNOSIS — G8929 Other chronic pain: Secondary | ICD-10-CM | POA: Diagnosis not present

## 2020-11-12 DIAGNOSIS — M25511 Pain in right shoulder: Secondary | ICD-10-CM

## 2020-11-12 DIAGNOSIS — R29898 Other symptoms and signs involving the musculoskeletal system: Secondary | ICD-10-CM | POA: Diagnosis not present

## 2020-11-12 DIAGNOSIS — M6281 Muscle weakness (generalized): Secondary | ICD-10-CM

## 2020-11-12 NOTE — Therapy (Signed)
[facility] [place], [place], [address] Phone: [phone]   Fax:  [phone]  Physical Therapy Treatment  Patient Details  Name: Megan [name] MRN: [id number] Date of Birth: February 08, 1961 Referring Provider (PT): Thekkekandam   Encounter Date: 11/12/2020   PT End of Session - 11/12/20 0720     Visit Number 3    Number of Visits 12    Date for PT Re-Evaluation 12/17/20    PT Start Time 0717    PT Stop Time 0759    PT Time Calculation (min) 42 min    Activity Tolerance Patient tolerated treatment well    Behavior During Therapy [facility] for tasks assessed/performed             Past Medical History:  Diagnosis Date   Acute respiratory failure ([place])    Crohn's disease ([place])    Diabetes mellitus without complication ([employer])    GERD (gastroesophageal reflux disease)    Hypertension    Menopausal vasomotor syndrome    Obesity    Pre-diabetes    RSV (respiratory syncytial virus pneumonia)     Past Surgical History:  Procedure Laterality Date   ANKLE ARTHROSCOPY Left 1985   COLONOSCOPY  2011   IN [place]=diverticulosis   DILATION AND CURETTAGE OF UTERUS N/A 08/14/2017   Procedure: DILATATION AND CURETTAGE;  Surgeon: Emily Filbert, MD;  Location: [facility];  Service: Gynecology;  Laterality: N/A;   REPLACEMENT TOTAL KNEE Right 2021    There were no vitals filed for this visit.   Subjective Assessment - 11/12/20 0716     Subjective Pt reports that she no longer has pain at rest and is sleeping much better.  HEP is going well.  She reports she still can not lift anything with Rt arm, or put Rt arm behind back.    Patient Stated Goals decrease pain and improve use of arm    Currently in Pain? No/denies    Pain Score 0-No pain                [facility] PT Assessment - 11/12/20 0001       Assessment   Medical Diagnosis chronic Rt shoulder pain    Referring Provider (PT)  Thekkekandam    Onset Date/Surgical Date 08/27/20    Hand Dominance Right    Next MD Visit 12/10/20      AROM   Right Shoulder Extension 58 Degrees    Right Shoulder Internal Rotation --   thumb to T10             [facility] Adult PT Treatment/Exercise - 11/12/20 0001       Shoulder Exercises: Supine   Flexion AAROM;Both;5 reps   cane   ABduction AAROM;Right;5 reps   cane     Shoulder Exercises: Standing   Flexion AAROM;Both;5 reps    ABduction AAROM;Right;5 reps    Row Strengthening;Both;15 reps    Theraband Level (Shoulder Row) Level 2 (Red)    Retraction AROM;Both;5 reps   L's x 5 sec hold, back against noodle.     Shoulder Exercises: Pulleys   Flexion 2 minutes    Scaption 2 minutes      Shoulder Exercises: Isometric Strengthening   Flexion 5X5"    Extension 5X5"    External Rotation 5X5"    Internal Rotation 5X5"   painful initially, decreased contraction and pain decreased.   ABduction 5X5"      Shoulder Exercises: Stretch  Internal Rotation Stretch 3 reps   15 sec holds, strap assist behind back   Table Stretch - Flexion 5 reps   5-8 sec holds   Table Stretch - Abduction 2 reps   painful.   Other Shoulder Stretches trial of L stretch at counter - x 2 reps (too painful)      Vasopneumatic   Number Minutes Vasopneumatic  10 minutes    Vasopnuematic Location  Shoulder    Vasopneumatic Pressure Low    Vasopneumatic Temperature  34               PT Long Term Goals - 11/12/20 0751       PT LONG TERM GOAL #1   Title Pt will be independent with HEP    Time 6    Period Weeks    Status On-going      PT LONG TERM GOAL #2   Title Pt will improve FOTO to >= 69 to demo improved functional mobility    Time 6    Period Weeks    Status On-going      PT LONG TERM GOAL #3   Title Pt will improve Rt UE strength to 4/5 to improve ability to lift and carry with decreased pain    Time 6    Period Weeks    Status On-going      PT LONG TERM GOAL #4   Title  Pt will improve Rt shoulder flexion to 160 and abduction to 130 to improve functional activity tolerance    Time 6    Period Weeks    Status On-going                   Plan - 11/12/20 0734     Clinical Impression Statement Good gradual progress with Rt shoulder ROM.  Abdct and IR continue to be painful at mid-end available range.  She reported pain up to 5/10 with exercise but resolves with rest.  Progressing well towards goals.    Rehab Potential Good    PT Frequency 2x / week    PT Duration 6 weeks    PT Treatment/Interventions Vasopneumatic Device;Cryotherapy;Aquatic Therapy;Moist Heat;Iontophoresis 42m/ml Dexamethasone;Electrical Stimulation;Therapeutic activities;Therapeutic exercise;Patient/family education;Manual techniques;Passive range of motion;Dry needling;Taping    PT Next Visit Plan progress strength and ROM as tolerated    PT Home Exercise Plan [email or web address]            Patient will benefit from skilled therapeutic intervention in order to improve the following deficits and impairments:  Pain, Impaired UE functional use, Impaired flexibility, Decreased strength, Decreased activity tolerance, Decreased range of motion  Visit Diagnosis: Chronic right shoulder pain  Muscle weakness (generalized)  Other symptoms and signs involving the musculoskeletal system     Problem List Patient Active Problem List   Diagnosis Date Noted   Chronic right shoulder pain 10/30/2020   Right hip pain 010/17/5102  Metabolic syndrome 058/52/7782  Major depressive disorder with single episode, in full remission ([place] 05/19/2019   Primary osteoarthritis of right knee 11/01/2018   Depressive disorder in remission ([place] 04/20/2018   Encounter for weight management 03/02/2018   Hearing loss of right ear 02/23/2018   Hiatal hernia with GERD 01/11/2018   Postmenopausal 10/07/2017   Encounter for medication management 10/07/2017   Atelectasis of left lung 09/08/2017   Pruritus  09/07/2017   Uterine leiomyoma 07/17/2017   Abnormal uterine bleeding (AUB) 07/17/2017   Prediabetes 07/09/2017   Anxiety and depression 06/15/2017  Menopausal vasomotor syndrome 06/04/2017   Gastroesophageal reflux disease without esophagitis 03/07/2014   Insomnia 02/06/2012   Hyperlipidemia 07/29/2011   Essential hypertension 01/28/2011   Class 1 obesity due to excess calories with serious comorbidity in adult 01/28/2011   Kerin Perna, PTA 11/12/20 7:54 AM  [facility] [place], [place], [address] Phone: [phone]   Fax:  [phone]  Name: Symia Herdt MRN: [id number] Date of Birth: Sep 13, 1960

## 2020-11-14 ENCOUNTER — Ambulatory Visit (INDEPENDENT_AMBULATORY_CARE_PROVIDER_SITE_OTHER): Payer: 59 | Admitting: Physical Therapy

## 2020-11-14 ENCOUNTER — Other Ambulatory Visit: Payer: Self-pay

## 2020-11-14 DIAGNOSIS — G8929 Other chronic pain: Secondary | ICD-10-CM

## 2020-11-14 DIAGNOSIS — M25511 Pain in right shoulder: Secondary | ICD-10-CM | POA: Diagnosis not present

## 2020-11-14 DIAGNOSIS — M6281 Muscle weakness (generalized): Secondary | ICD-10-CM | POA: Diagnosis not present

## 2020-11-14 DIAGNOSIS — R29898 Other symptoms and signs involving the musculoskeletal system: Secondary | ICD-10-CM | POA: Diagnosis not present

## 2020-11-14 NOTE — Therapy (Signed)
[place], [place], [address] Phone: [phone]   Fax:  [phone]  Physical Therapy Treatment  Patient Details  Name: Megan [name] MRN: [id number] Date of Birth: 1960-09-08 Referring Provider (PT): Thekkekandam   Encounter Date: 11/14/2020   PT End of Session - 11/14/20 0939     Visit Number 4    Number of Visits 12    Date for PT Re-Evaluation 12/17/20    PT Start Time 0933    PT Stop Time 1017    PT Time Calculation (min) 44 min    Activity Tolerance Patient tolerated treatment well    Behavior During Therapy [facility] for tasks assessed/performed             Past Medical History:  Diagnosis Date   Acute respiratory failure ([place])    Crohn's disease ([place])    Diabetes mellitus without complication ([place])    GERD (gastroesophageal reflux disease)    Hypertension    Menopausal vasomotor syndrome    Obesity    Pre-diabetes    RSV (respiratory syncytial virus pneumonia)     Past Surgical History:  Procedure Laterality Date   ANKLE ARTHROSCOPY Left 1985   COLONOSCOPY  2011   IN [place]=diverticulosis   DILATION AND CURETTAGE OF UTERUS N/A 08/14/2017   Procedure: DILATATION AND CURETTAGE;  Surgeon: Emily Filbert, MD;  Location: [facility];  Service: Gynecology;  Laterality: N/A;   REPLACEMENT TOTAL KNEE Right 2021    There were no vitals filed for this visit.   Subjective Assessment - 11/14/20 0941     Subjective Pt reports that she was sore yesterday, "I could tell I had done some work".  She reports pain in Rt shoulder when pushing up from a chair and reaching behind back. She states that after session Monday, she noticed she could do more with Rt arm.    Limitations Lifting;House hold activities    Diagnostic tests x ray negative for fracture    Patient Stated Goals decrease pain and improve use of arm    Currently in Pain? No/denies    Pain Score --   no pain  at rest.               [facility] PT Assessment - 11/14/20 0001       Assessment   Medical Diagnosis chronic Rt shoulder pain    Referring Provider (PT) Thekkekandam    Onset Date/Surgical Date 08/27/20    Hand Dominance Right    Next MD Visit 12/10/20                [facility] Adult PT Treatment/Exercise - 11/14/20 0001       Shoulder Exercises: Supine   Flexion AAROM;Both;5 reps   cane     Shoulder Exercises: Standing   Internal Rotation AAROM;Both;10 reps   cane behind back   Extension AAROM;Both;10 reps   cane   Row Strengthening;Right;10 reps    Theraband Level (Shoulder Row) Level 3 (Green)      Shoulder Exercises: Pulleys   Flexion 3 minutes    Scaption 2 minutes      Shoulder Exercises: Isometric Strengthening   Flexion Limitations reactive isometric with green band x 5 reps    Extension Limitations --    External Rotation Limitations reactive isometric with red band x 5 reps    Internal Rotation Limitations reactive isometric with red band x 5 reps      Shoulder Exercises:  Stretch   Corner Stretch 3 reps;10 seconds   limited tolerance   Corner Stretch Limitations and trial of straight arm pec stretch - not tolerated.    Other Shoulder Stretches hooklying on noodle: prolonged stretch into abdct for pec stretch. 3 reps of 30 sec            Vaso - Medium pressure, 34, 10 min, for pain reduction.     PT Long Term Goals - 11/12/20 0751       PT LONG TERM GOAL #1   Title Pt will be independent with HEP    Time 6    Period Weeks    Status On-going      PT LONG TERM GOAL #2   Title Pt will improve FOTO to >= 69 to demo improved functional mobility    Time 6    Period Weeks    Status On-going      PT LONG TERM GOAL #3   Title Pt will improve Rt UE strength to 4/5 to improve ability to lift and carry with decreased pain    Time 6    Period Weeks    Status On-going      PT LONG TERM GOAL #4   Title Pt will improve Rt shoulder flexion to 160 and  abduction to 130 to improve functional activity tolerance    Time 6    Period Weeks    Status On-going                   Plan - 11/14/20 0768     Clinical Impression Statement Pt has limited tolerance for standing pec stretch in corner; improved in hooklying while laying on noodle.  Progressed to reactive isometrics with good tolerance.  Continued education on importance of posture on shoulder position and pain reduction. Progressing well towards goals.    Rehab Potential Good    PT Frequency 2x / week    PT Duration 6 weeks    PT Treatment/Interventions Vasopneumatic Device;Cryotherapy;Aquatic Therapy;Moist Heat;Iontophoresis 4m/ml Dexamethasone;Electrical Stimulation;Therapeutic activities;Therapeutic exercise;Patient/family education;Manual techniques;Passive range of motion;Dry needling;Taping    PT Next Visit Plan progress strength and ROM as tolerated    PT Home Exercise Plan [email or web address]            Patient will benefit from skilled therapeutic intervention in order to improve the following deficits and impairments:  Pain, Impaired UE functional use, Impaired flexibility, Decreased strength, Decreased activity tolerance, Decreased range of motion  Visit Diagnosis: Chronic right shoulder pain  Muscle weakness (generalized)  Other symptoms and signs involving the musculoskeletal system     Problem List Patient Active Problem List   Diagnosis Date Noted   Chronic right shoulder pain 10/30/2020   Right hip pain 059/45/8592  Metabolic syndrome 092/44/6286  Major depressive disorder with single episode, in full remission ([place] 05/19/2019   Primary osteoarthritis of right knee 11/01/2018   Depressive disorder in remission ([place] 04/20/2018   Encounter for weight management 03/02/2018   Hearing loss of right ear 02/23/2018   Hiatal hernia with GERD 01/11/2018   Postmenopausal 10/07/2017   Encounter for medication management 10/07/2017   Atelectasis of left lung  09/08/2017   Pruritus 09/07/2017   Uterine leiomyoma 07/17/2017   Abnormal uterine bleeding (AUB) 07/17/2017   Prediabetes 07/09/2017   Anxiety and depression 06/15/2017   Menopausal vasomotor syndrome 06/04/2017   Gastroesophageal reflux disease without esophagitis 03/07/2014   Insomnia 02/06/2012   Hyperlipidemia 07/29/2011   Essential hypertension  01/28/2011   Class 1 obesity due to excess calories with serious comorbidity in adult 01/28/2011   Kerin Perna, PTA 11/14/20 10:13 AM  [place], [place], [address] Phone: [phone]   Fax:  [phone]  Name: Shaterra Sanzone MRN: [id number] Date of Birth: 11-17-1960

## 2020-11-19 ENCOUNTER — Other Ambulatory Visit: Payer: Self-pay

## 2020-11-19 ENCOUNTER — Encounter: Payer: Self-pay | Admitting: Physical Therapy

## 2020-11-19 ENCOUNTER — Ambulatory Visit (INDEPENDENT_AMBULATORY_CARE_PROVIDER_SITE_OTHER): Payer: 59 | Admitting: Physical Therapy

## 2020-11-19 DIAGNOSIS — M6281 Muscle weakness (generalized): Secondary | ICD-10-CM | POA: Diagnosis not present

## 2020-11-19 DIAGNOSIS — G8929 Other chronic pain: Secondary | ICD-10-CM

## 2020-11-19 DIAGNOSIS — M25511 Pain in right shoulder: Secondary | ICD-10-CM | POA: Diagnosis not present

## 2020-11-19 DIAGNOSIS — R29898 Other symptoms and signs involving the musculoskeletal system: Secondary | ICD-10-CM | POA: Diagnosis not present

## 2020-11-19 NOTE — Patient Instructions (Signed)
TENS UNIT: This is helpful for muscle pain and spasm.   Search and Purchase a TENS 7000 2nd edition at [employer]. It should be less than $30.     TENS unit instructions: Do not shower or bathe with the unit on Turn the unit off before removing electrodes or batteries If the electrodes lose stickiness add a drop of water to the electrodes after they are disconnected from the unit and place on plastic sheet. If you continued to have difficulty, call the TENS unit company to purchase more electrodes. Do not apply lotion on the skin area prior to use. Make sure the skin is clean and dry as this will help prolong the life of the electrodes. After use, always check skin for unusual red areas, rash or other skin difficulties. If there are any skin problems, does not apply electrodes to the same area. Never remove the electrodes from the unit by pulling the wires. Do not use the TENS unit or electrodes other than as directed. Do not change electrode placement without consultating your therapist or physician. Keep 2 fingers width between each electrode. Wear time ratio is 2:1, on to off times.    For example on for 30 minutes off for 15 minutes and then on for 30 minutes off for 15 minutes

## 2020-11-19 NOTE — Therapy (Signed)
[place], [place], [address] Phone: [phone]   Fax:  [phone]  Physical Therapy Treatment  Patient Details  Name: Megan [name] MRN: [id number] Date of Birth: Oct 07, 1960 Referring Provider (PT): Thekkekandam   Encounter Date: 11/19/2020   PT End of Session - 11/19/20 0724     Visit Number 5    Number of Visits 12    Date for PT Re-Evaluation 12/17/20    PT Start Time 0716    PT Stop Time 0805    PT Time Calculation (min) 49 min    Activity Tolerance Patient limited by pain    Behavior During Therapy [facility] for tasks assessed/performed             Past Medical History:  Diagnosis Date   Acute respiratory failure ([place])    Crohn's disease ([place])    Diabetes mellitus without complication ([place])    GERD (gastroesophageal reflux disease)    Hypertension    Menopausal vasomotor syndrome    Obesity    Pre-diabetes    RSV (respiratory syncytial virus pneumonia)     Past Surgical History:  Procedure Laterality Date   ANKLE ARTHROSCOPY Left 1985   COLONOSCOPY  2011   IN [place]=diverticulosis   DILATION AND CURETTAGE OF UTERUS N/A 08/14/2017   Procedure: DILATATION AND CURETTAGE;  Surgeon: Emily Filbert, MD;  Location: [place];  Service: Gynecology;  Laterality: N/A;   REPLACEMENT TOTAL KNEE Right 2021    There were no vitals filed for this visit.   Subjective Assessment - 11/19/20 0722     Subjective Pt reports that she woke up Saturday AM with throbbing pain in Rt shoulder; unsure what she did to cause the pain. Pain stayed elevated all weekend long.    Limitations Lifting;House hold activities    Diagnostic tests x ray negative for fracture    Patient Stated Goals decrease pain and improve use of arm    Currently in Pain? Yes    Pain Score 8     Pain Location Shoulder    Pain Orientation Right;Anterior    Pain Descriptors / Indicators  Throbbing;Dull;Aching    Aggravating Factors  pushing up from chair, pulling, lifting    Pain Relieving Factors ice.                [facility] PT Assessment - 11/19/20 0001       Assessment   Medical Diagnosis chronic Rt shoulder pain    Referring Provider (PT) Thekkekandam    Onset Date/Surgical Date 08/27/20    Hand Dominance Right    Next MD Visit 12/10/20      ROM / Strength   AROM / PROM / Strength PROM      PROM   PROM Assessment Site Shoulder    Right/Left Shoulder Right    Right Shoulder Flexion 140 Degrees   AAROM in supine   Right Shoulder External Rotation 63 Degrees   AAROM in supine             [facility] Adult PT Treatment/Exercise - 11/19/20 0001       Shoulder Exercises: Supine   External Rotation Right;AAROM;10 reps   with cane   Flexion AAROM;Both;10 reps   cane     Shoulder Exercises: Standing   Other Standing Exercises scap squeeze (back against noodle, arms neutral) x 5 sec x 10      Shoulder Exercises: Isometric Strengthening   Flexion 5X5"  Extension --   5" x 10 reps   External Rotation Limitations 1 rep - painful    Internal Rotation Limitations 1 rep - painful    ABduction Limitations 2 reps - painful      Shoulder Exercises: Stretch   Table Stretch - Flexion 5 reps;10 seconds    Other Shoulder Stretches Rt biceps brachii stretch with Rt hand holding railing x 20 sec x 2.      Modalities   Modalities Moist Heat;Vasopneumatic;Electrical Stimulation      Moist Heat Therapy   Number Minutes Moist Heat --   12, during STM at beginning of session   Moist Heat Location Shoulder   Rt     Electrical Stimulation   Electrical Stimulation Location Rt ant/post shoulder    Electrical Stimulation Action TENS    Electrical Stimulation Parameters 10 min, normal mode, intensity to tolerance - during vaso    Electrical Stimulation Goals Pain      Vasopneumatic   Number Minutes Vasopneumatic  10 minutes    Vasopnuematic Location  Shoulder     Vasopneumatic Pressure Medium    Vasopneumatic Temperature  34      Manual Therapy   Manual therapy comments pt in supne and Lt sidelying.    Soft tissue mobilization IASTM to Rt biceps brachii; STM to Rt ant shoulder and triceps brachii;  TPR to Rt infraspinatus and teres minor with active ER.    Passive ROM PROM Rt shoulder flexion, ER, scaption (guarded)                          PT Long Term Goals - 11/12/20 0751       PT LONG TERM GOAL #1   Title Pt will be independent with HEP    Time 6    Period Weeks    Status On-going      PT LONG TERM GOAL #2   Title Pt will improve FOTO to >= 69 to demo improved functional mobility    Time 6    Period Weeks    Status On-going      PT LONG TERM GOAL #3   Title Pt will improve Rt UE strength to 4/5 to improve ability to lift and carry with decreased pain    Time 6    Period Weeks    Status On-going      PT LONG TERM GOAL #4   Title Pt will improve Rt shoulder flexion to 160 and abduction to 130 to improve functional activity tolerance    Time 6    Period Weeks    Status On-going                   Plan - 11/19/20 5400     Clinical Impression Statement Flare up of symptoms in Rt shoulder.  Point tender in prox bicep brachii tendon, teres minor/infraspinatus with STM.  Limited tolerance for exercises today; unable to tolerate abdct/IR/ER.  Pt not interested in DN at this time (given brief description).  No goals met with current flare up.    Rehab Potential Good    PT Frequency 2x / week    PT Duration 6 weeks    PT Treatment/Interventions Vasopneumatic Device;Cryotherapy;Aquatic Therapy;Moist Heat;Iontophoresis 74m/ml Dexamethasone;Electrical Stimulation;Therapeutic activities;Therapeutic exercise;Patient/family education;Manual techniques;Passive range of motion;Dry needling;Taping    PT Next Visit Plan progress strength and ROM as tolerated    PT Home Exercise Plan [email or web address]   Consulted and  Agree with  Plan of Care Patient             Patient will benefit from skilled therapeutic intervention in order to improve the following deficits and impairments:  Pain, Impaired UE functional use, Impaired flexibility, Decreased strength, Decreased activity tolerance, Decreased range of motion  Visit Diagnosis: Chronic right shoulder pain  Muscle weakness (generalized)  Other symptoms and signs involving the musculoskeletal system     Problem List Patient Active Problem List   Diagnosis Date Noted   Chronic right shoulder pain 10/30/2020   Right hip pain 76/39/4320   Metabolic syndrome 03/79/4446   Major depressive disorder with single episode, in full remission ([place]) 05/19/2019   Primary osteoarthritis of right knee 11/01/2018   Depressive disorder in remission 04/20/2018   Encounter for weight management 03/02/2018   Hearing loss of right ear 02/23/2018   Hiatal hernia with GERD 01/11/2018   Postmenopausal 10/07/2017   Encounter for medication management 10/07/2017   Atelectasis of left lung 09/08/2017   Pruritus 09/07/2017   Uterine leiomyoma 07/17/2017   Abnormal uterine bleeding (AUB) 07/17/2017   Prediabetes 07/09/2017   Anxiety and depression 06/15/2017   Menopausal vasomotor syndrome 06/04/2017   Gastroesophageal reflux disease without esophagitis 03/07/2014   Insomnia 02/06/2012   Hyperlipidemia 07/29/2011   Essential hypertension 01/28/2011   Class 1 obesity due to excess calories with serious comorbidity in adult 01/28/2011   Kerin Perna, PTA 11/19/20 8:31 AM  [place], [place], [address] Phone: [phone]   Fax:  [phone]  Name: Megan [name] MRN: [id number] Date of Birth: 06/18/1960

## 2020-11-21 ENCOUNTER — Other Ambulatory Visit: Payer: Self-pay

## 2020-11-21 ENCOUNTER — Ambulatory Visit (INDEPENDENT_AMBULATORY_CARE_PROVIDER_SITE_OTHER): Payer: 59 | Admitting: Physical Therapy

## 2020-11-21 DIAGNOSIS — R29898 Other symptoms and signs involving the musculoskeletal system: Secondary | ICD-10-CM | POA: Diagnosis not present

## 2020-11-21 DIAGNOSIS — M6281 Muscle weakness (generalized): Secondary | ICD-10-CM | POA: Diagnosis not present

## 2020-11-21 DIAGNOSIS — M25511 Pain in right shoulder: Secondary | ICD-10-CM

## 2020-11-21 DIAGNOSIS — G8929 Other chronic pain: Secondary | ICD-10-CM

## 2020-11-21 NOTE — Therapy (Signed)
[place], [place], [address] Phone: [phone]   Fax:  [phone]  Physical Therapy Treatment  Patient Details  Name: Megan [name] MRN: [id number] Date of Birth: November 06, 1960 Referring Provider (PT): Thekkekandam   Encounter Date: 11/21/2020   PT End of Session - 11/21/20 1127     Visit Number 6    Number of Visits 12    Date for PT Re-Evaluation 12/17/20    PT Start Time 0845    PT Stop Time 0930    PT Time Calculation (min) 45 min    Activity Tolerance Patient tolerated treatment well    Behavior During Therapy [facility] for tasks assessed/performed             Past Medical History:  Diagnosis Date   Acute respiratory failure ([place])    Crohn's disease ([place])    Diabetes mellitus without complication ([place])    GERD (gastroesophageal reflux disease)    Hypertension    Menopausal vasomotor syndrome    Obesity    Pre-diabetes    RSV (respiratory syncytial virus pneumonia)     Past Surgical History:  Procedure Laterality Date   ANKLE ARTHROSCOPY Left 1985   COLONOSCOPY  2011   IN [place]=diverticulosis   DILATION AND CURETTAGE OF UTERUS N/A 08/14/2017   Procedure: DILATATION AND CURETTAGE;  Surgeon: Emily Filbert, MD;  Location: [place];  Service: Gynecology;  Laterality: N/A;   REPLACEMENT TOTAL KNEE Right 2021    There were no vitals filed for this visit.   Subjective Assessment - 11/21/20 0847     Subjective Pt states "It's not as bad as Monday, but it still hurts"    Patient Stated Goals decrease pain and improve use of arm    Currently in Pain? Yes    Pain Score 6     Pain Location Shoulder    Pain Orientation Right;Anterior    Pain Descriptors / Indicators Aching                [facility] PT Assessment - 11/21/20 0001       Assessment   Medical Diagnosis chronic Rt shoulder pain    Referring Provider (PT) Thekkekandam    Onset Date/Surgical Date  08/27/20    Hand Dominance Right    Next MD Visit 12/10/20                           [facility] Adult PT Treatment/Exercise - 11/21/20 0001       Shoulder Exercises: Supine   Flexion AAROM;Both;10 reps   cane     Shoulder Exercises: Standing   Extension Strengthening;20 reps    Theraband Level (Shoulder Extension) Level 2 (Red)    Extension Limitations to neutral extension    Row Strengthening;20 reps    Theraband Level (Shoulder Row) Level 2 (Red)    Row Limitations to neutral/no shoulder extension due to pain    Other Standing Exercises shoulder extension AAROM with cane x 10      Shoulder Exercises: Pulleys   Flexion 2 minutes    Scaption 2 minutes      Shoulder Exercises: Isometric Strengthening   External Rotation --   x 10 red TB reactive isometrics   Internal Rotation --   red TB x 10 reactive isometrics     Shoulder Exercises: Stretch   Table Stretch - Flexion 5 reps;10 seconds      Vasopneumatic  Number Minutes Vasopneumatic  10 minutes    Vasopnuematic Location  Shoulder    Vasopneumatic Pressure Medium    Vasopneumatic Temperature  34      Manual Therapy   Soft tissue mobilization STM Rt anterior shoulder    Passive ROM PROM Rt shoulder flexion, scaption, ER as tolerated                          PT Long Term Goals - 11/12/20 0751       PT LONG TERM GOAL #1   Title Pt will be independent with HEP    Time 6    Period Weeks    Status On-going      PT LONG TERM GOAL #2   Title Pt will improve FOTO to >= 69 to demo improved functional mobility    Time 6    Period Weeks    Status On-going      PT LONG TERM GOAL #3   Title Pt will improve Rt UE strength to 4/5 to improve ability to lift and carry with decreased pain    Time 6    Period Weeks    Status On-going      PT LONG TERM GOAL #4   Title Pt will improve Rt shoulder flexion to 160 and abduction to 130 to improve functional activity tolerance    Time 6    Period  Weeks    Status On-going                   Plan - 11/21/20 1128     Clinical Impression Statement Pt with improved tolerance to exercises vs last session. Continues with limitations in shoulder flexion and pain with resisted ER/IR. Pt progressing towards goals    PT Next Visit Plan progress strength and ROM as tolerated, update HEP    PT Home Exercise Plan [email or web address]    Consulted and Agree with Plan of Care Patient             Patient will benefit from skilled therapeutic intervention in order to improve the following deficits and impairments:     Visit Diagnosis: Chronic right shoulder pain  Muscle weakness (generalized)  Other symptoms and signs involving the musculoskeletal system     Problem List Patient Active Problem List   Diagnosis Date Noted   Chronic right shoulder pain 10/30/2020   Right hip pain 34/28/7681   Metabolic syndrome 15/72/6203   Major depressive disorder with single episode, in full remission ([place]) 05/19/2019   Primary osteoarthritis of right knee 11/01/2018   Depressive disorder in remission 04/20/2018   Encounter for weight management 03/02/2018   Hearing loss of right ear 02/23/2018   Hiatal hernia with GERD 01/11/2018   Postmenopausal 10/07/2017   Encounter for medication management 10/07/2017   Atelectasis of left lung 09/08/2017   Pruritus 09/07/2017   Uterine leiomyoma 07/17/2017   Abnormal uterine bleeding (AUB) 07/17/2017   Prediabetes 07/09/2017   Anxiety and depression 06/15/2017   Menopausal vasomotor syndrome 06/04/2017   Gastroesophageal reflux disease without esophagitis 03/07/2014   Insomnia 02/06/2012   Hyperlipidemia 07/29/2011   Essential hypertension 01/28/2011   Class 1 obesity due to excess calories with serious comorbidity in adult 01/28/2011    Fotini Lemus, PT 11/21/2020, 11:30 AM  [facility] [place], [place], [address] Phone:  [phone]   Fax:  [phone]  Name: Melodie Ashworth MRN: [id number] Date of  Birth: 02/18/1961

## 2020-11-27 ENCOUNTER — Ambulatory Visit (INDEPENDENT_AMBULATORY_CARE_PROVIDER_SITE_OTHER): Payer: 59 | Admitting: Physical Therapy

## 2020-11-27 ENCOUNTER — Other Ambulatory Visit: Payer: Self-pay

## 2020-11-27 DIAGNOSIS — M6281 Muscle weakness (generalized): Secondary | ICD-10-CM | POA: Diagnosis not present

## 2020-11-27 DIAGNOSIS — M25511 Pain in right shoulder: Secondary | ICD-10-CM

## 2020-11-27 DIAGNOSIS — G8929 Other chronic pain: Secondary | ICD-10-CM | POA: Diagnosis not present

## 2020-11-27 DIAGNOSIS — R29898 Other symptoms and signs involving the musculoskeletal system: Secondary | ICD-10-CM

## 2020-11-27 NOTE — Patient Instructions (Signed)
Access Code: [email or web address] URL: [email or web address] Date: 11/27/2020 Prepared by: Isabelle Course  Exercises Standing Shoulder Abduction AAROM with [place] - 1 x daily - 7 x weekly - 1 sets - 10 reps - 3-5 sec hold Standing Shoulder Flexion AAROM with [place] - 1 x daily - 7 x weekly - 1 sets - 10 reps - 3-5 sec hold Standing Bilateral Low Shoulder Row with Anchored Resistance - 1 x daily - 7 x weekly - 2 sets - 10 reps Shoulder extension with resistance - Neutral - 1 x daily - 7 x weekly - 2 sets - 10 reps Shoulder External Rotation and Scapular Retraction with Resistance - 1 x daily - 7 x weekly - 2 sets - 10 reps Supine Scapular Protraction in Flexion with Dumbbells - 1 x daily - 7 x weekly - 2 sets - 10 reps

## 2020-11-27 NOTE — Therapy (Signed)
[place], [place], [address] Phone: [phone]   Fax:  [phone]  Physical Therapy Treatment  Patient Details  Name: Megan [name] MRN: [id number] Date of Birth: 1960/07/01 Referring Provider (PT): Thekkekandam   Encounter Date: 11/27/2020   PT End of Session - 11/27/20 0839     Visit Number 7    Number of Visits 12    Date for PT Re-Evaluation 12/17/20    PT Start Time 0800    PT Stop Time 0845    PT Time Calculation (min) 45 min    Activity Tolerance Patient tolerated treatment well    Behavior During Therapy [facility] for tasks assessed/performed             Past Medical History:  Diagnosis Date   Acute respiratory failure ([place])    Crohn's disease ([place])    Diabetes mellitus without complication ([place])    GERD (gastroesophageal reflux disease)    Hypertension    Menopausal vasomotor syndrome    Obesity    Pre-diabetes    RSV (respiratory syncytial virus pneumonia)     Past Surgical History:  Procedure Laterality Date   ANKLE ARTHROSCOPY Left 1985   COLONOSCOPY  2011   IN [place]=diverticulosis   DILATION AND CURETTAGE OF UTERUS N/A 08/14/2017   Procedure: DILATATION AND CURETTAGE;  Surgeon: Emily Filbert, MD;  Location: [place];  Service: Gynecology;  Laterality: N/A;   REPLACEMENT TOTAL KNEE Right 2021    There were no vitals filed for this visit.   Subjective Assessment - 11/27/20 0804     Subjective Pt states "I am doing a lot better"    Patient Stated Goals decrease pain and improve use of arm    Currently in Pain? Yes    Pain Score 2     Pain Location Shoulder    Pain Orientation Right                [facility] PT Assessment - 11/27/20 0001       Assessment   Medical Diagnosis chronic Rt shoulder pain    Referring Provider (PT) Thekkekandam    Onset Date/Surgical Date 08/27/20    Hand Dominance Right    Next MD Visit 12/10/20       AROM   Right Shoulder Flexion 139 Degrees    Right Shoulder ABduction 88 Degrees    Right Shoulder Internal Rotation 72 Degrees      Strength   Right Shoulder Flexion 3+/5    Right Shoulder ABduction 3/5                           [facility] Adult PT Treatment/Exercise - 11/27/20 0001       Shoulder Exercises: Supine   Protraction 20 reps;Right    Protraction Weight (lbs) 1    Horizontal ABduction 20 reps    Theraband Level (Shoulder Horizontal ABduction) Level 2 (Red)    Flexion AAROM;Both;10 reps   cane     Shoulder Exercises: Standing   External Rotation 20 reps;Right    Theraband Level (Shoulder External Rotation) Level 2 (Red)    Internal Rotation 20 reps;Right    Theraband Level (Shoulder Internal Rotation) Level 2 (Red)    Extension Strengthening;20 reps    Theraband Level (Shoulder Extension) Level 3 (Green)    Extension Limitations to neutral    Row Strengthening;20 reps    Theraband Level (Shoulder Row) Level  3 (Green)    Row Limitations to neutral    Other Standing Exercises bilat ER red TB x 20      Shoulder Exercises: Pulleys   Flexion 2 minutes    Scaption 2 minutes      Vasopneumatic   Number Minutes Vasopneumatic  10 minutes    Vasopnuematic Location  Shoulder    Vasopneumatic Pressure Medium    Vasopneumatic Temperature  34      Manual Therapy   Soft tissue mobilization STM Rt anterior shoulder and biceps    Passive ROM PROM Rt shoulder flexion, scaption, ER as tolerated                          PT Long Term Goals - 11/12/20 0751       PT LONG TERM GOAL #1   Title Pt will be independent with HEP    Time 6    Period Weeks    Status On-going      PT LONG TERM GOAL #2   Title Pt will improve FOTO to >= 69 to demo improved functional mobility    Time 6    Period Weeks    Status On-going      PT LONG TERM GOAL #3   Title Pt will improve Rt UE strength to 4/5 to improve ability to lift and carry with decreased pain     Time 6    Period Weeks    Status On-going      PT LONG TERM GOAL #4   Title Pt will improve Rt shoulder flexion to 160 and abduction to 130 to improve functional activity tolerance    Time 6    Period Weeks    Status On-going                   Plan - 11/27/20 0761     Clinical Impression Statement Pt with much improved exercise tolerance. Added serratus punch and horizontal abduction as well as IR/ER with bands. Pt has improved flexion and IR ROM but continues with pain with abduction    PT Next Visit Plan continue to progress strength and ROM    PT Home Exercise Plan [email or web address]    Consulted and Agree with Plan of Care Patient             Patient will benefit from skilled therapeutic intervention in order to improve the following deficits and impairments:     Visit Diagnosis: Chronic right shoulder pain  Muscle weakness (generalized)  Other symptoms and signs involving the musculoskeletal system     Problem List Patient Active Problem List   Diagnosis Date Noted   Chronic right shoulder pain 10/30/2020   Right hip pain 57/89/7847   Metabolic syndrome 84/01/8207   Major depressive disorder with single episode, in full remission ([place]) 05/19/2019   Primary osteoarthritis of right knee 11/01/2018   Depressive disorder in remission 04/20/2018   Encounter for weight management 03/02/2018   Hearing loss of right ear 02/23/2018   Hiatal hernia with GERD 01/11/2018   Postmenopausal 10/07/2017   Encounter for medication management 10/07/2017   Atelectasis of left lung 09/08/2017   Pruritus 09/07/2017   Uterine leiomyoma 07/17/2017   Abnormal uterine bleeding (AUB) 07/17/2017   Prediabetes 07/09/2017   Anxiety and depression 06/15/2017   Menopausal vasomotor syndrome 06/04/2017   Gastroesophageal reflux disease without esophagitis 03/07/2014   Insomnia 02/06/2012   Hyperlipidemia 07/29/2011   Essential hypertension  01/28/2011   Class 1 obesity due to  excess calories with serious comorbidity in adult 01/28/2011    Ithiel Liebler, PT 11/27/2020, 8:43 AM  [facility] [place], [place], [address] Phone: [phone]   Fax:  [phone]  Name: Megan [name] MRN: [id number] Date of Birth: [birth year]

## 2020-11-29 ENCOUNTER — Other Ambulatory Visit: Payer: Self-pay

## 2020-11-29 ENCOUNTER — Ambulatory Visit (INDEPENDENT_AMBULATORY_CARE_PROVIDER_SITE_OTHER): Payer: 59 | Admitting: Physical Therapy

## 2020-11-29 ENCOUNTER — Encounter: Payer: Self-pay | Admitting: Physical Therapy

## 2020-11-29 DIAGNOSIS — M25511 Pain in right shoulder: Secondary | ICD-10-CM | POA: Diagnosis not present

## 2020-11-29 DIAGNOSIS — R29898 Other symptoms and signs involving the musculoskeletal system: Secondary | ICD-10-CM

## 2020-11-29 DIAGNOSIS — M6281 Muscle weakness (generalized): Secondary | ICD-10-CM | POA: Diagnosis not present

## 2020-11-29 DIAGNOSIS — G8929 Other chronic pain: Secondary | ICD-10-CM

## 2020-11-29 NOTE — Therapy (Addendum)
[place], [place], [address] Phone: [phone]   Fax:  [phone]  Physical Therapy Treatment and Discharge  Patient Details  Name: Megan [name] MRN: [id number] Date of Birth: 1960/06/06 Referring Provider (PT): Thekkekandam   Encounter Date: 11/29/2020   PT End of Session - 11/29/20 0933     Visit Number 8    Number of Visits 12    Date for PT Re-Evaluation 12/17/20    PT Start Time 0931    PT Stop Time 4034    PT Time Calculation (min) 44 min    Activity Tolerance Patient tolerated treatment well    Behavior During Therapy [facility] for tasks assessed/performed             Past Medical History:  Diagnosis Date   Acute respiratory failure ([place])    Crohn's disease ([place])    Diabetes mellitus without complication ([place])    GERD (gastroesophageal reflux disease)    Hypertension    Menopausal vasomotor syndrome    Obesity    Pre-diabetes    RSV (respiratory syncytial virus pneumonia)     Past Surgical History:  Procedure Laterality Date   ANKLE ARTHROSCOPY Left 1985   COLONOSCOPY  2011   IN [place]=diverticulosis   DILATION AND CURETTAGE OF UTERUS N/A 08/14/2017   Procedure: DILATATION AND CURETTAGE;  Surgeon: Emily Filbert, MD;  Location: [place];  Service: Gynecology;  Laterality: N/A;   REPLACEMENT TOTAL KNEE Right 2021    There were no vitals filed for this visit.   Subjective Assessment - 11/29/20 0937     Subjective Pt reports that her Rt shoulder is improving.  She cannot sleep on her Rt side with arm up by her head, lift heavy items, or reach fully behind herself.    Patient Stated Goals decrease pain and improve use of arm    Currently in Pain? No/denies    Pain Score 0-No pain                [facility] PT Assessment - 11/29/20 0001       Assessment   Medical Diagnosis chronic Rt shoulder pain    Referring Provider (PT) Thekkekandam    Onset  Date/Surgical Date 08/27/20    Hand Dominance Right    Next MD Visit 12/10/20               [facility] Adult PT Treatment/Exercise - 11/29/20 0001       Elbow Exercises   Elbow Flexion Strengthening;Right;10 reps   4#, 2 sets     Shoulder Exercises: Standing   External Rotation Strengthening;Right;10 reps , 2 sets    Theraband Level (Shoulder External Rotation) Level 2 (Red)    Internal Rotation Strengthening;Right;10 reps , 2 sets    Theraband Level (Shoulder Internal Rotation) Level 2 (Red)    Internal Rotation Limitations IR AAROM with cane behind back x10    Flexion Right;10 reps;AAROM   cane, mirror for feedback on posture   ABduction AAROM;Right;10 reps   [employer], Geologist, engineering for feedback on form/posture   Extension AAROM;Both;10 reps   cane   Row Both;12 reps , 2 sets   Theraband Level (Shoulder Row) Level 3 (Green)    Other Standing Exercises forward punch with yelllow x10 (red was painful) , 2 sets     Shoulder Exercises: Pulleys   Flexion 2 minutes    Scaption 2 minutes      Shoulder Exercises: ROM/Strengthening  UBE (Upper Arm Bike) L1:  2 min forward, 1 min backward, standing      Shoulder Exercises: Stretch   Corner Stretch 2 reps;10 seconds   arms in W position            Vaso on medium pressure, 34 for pain relief/prevention. X 10 min at end of session.       PT Long Term Goals - 11/12/20 0751       PT LONG TERM GOAL #1   Title Pt will be independent with HEP    Time 6    Period Weeks    Status On-going      PT LONG TERM GOAL #2   Title Pt will improve FOTO to >= 69 to demo improved functional mobility    Time 6    Period Weeks    Status On-going      PT LONG TERM GOAL #3   Title Pt will improve Rt UE strength to 4/5 to improve ability to lift and carry with decreased pain    Time 6    Period Weeks    Status On-going      PT LONG TERM GOAL #4   Title Pt will improve Rt shoulder flexion to 160 and abduction to 130 to improve functional  activity tolerance    Time 6    Period Weeks    Status On-going                   Plan - 11/29/20 1006     Clinical Impression Statement Pt continues with limited strength in Rt shoulder.  She was able to tolerate red band for IR/ER, but only yellow for forward punch.  She completed all exercises without pain.  Progressing well towards LTGs.    Rehab Potential Good    PT Frequency 2x / week    PT Duration 6 weeks    PT Next Visit Plan continue to progress strength and ROM -MD note prior to upcoming appt.    PT Home Exercise Plan [email or web address]    Consulted and Agree with Plan of Care Patient             Patient will benefit from skilled therapeutic intervention in order to improve the following deficits and impairments:  Pain, Impaired UE functional use, Impaired flexibility, Decreased strength, Decreased activity tolerance, Decreased range of motion  Visit Diagnosis: Chronic right shoulder pain  Muscle weakness (generalized)  Other symptoms and signs involving the musculoskeletal system     Problem List Patient Active Problem List   Diagnosis Date Noted   Chronic right shoulder pain 10/30/2020   Right hip pain 80/16/5537   Metabolic syndrome 48/27/0786   Major depressive disorder with single episode, in full remission ([place]) 05/19/2019   Primary osteoarthritis of right knee 11/01/2018   Depressive disorder in remission 04/20/2018   Encounter for weight management 03/02/2018   Hearing loss of right ear 02/23/2018   Hiatal hernia with GERD 01/11/2018   Postmenopausal 10/07/2017   Encounter for medication management 10/07/2017   Atelectasis of left lung 09/08/2017   Pruritus 09/07/2017   Uterine leiomyoma 07/17/2017   Abnormal uterine bleeding (AUB) 07/17/2017   Prediabetes 07/09/2017   Anxiety and depression 06/15/2017   Menopausal vasomotor syndrome 06/04/2017   Gastroesophageal reflux disease without esophagitis 03/07/2014   Insomnia 02/06/2012    Hyperlipidemia 07/29/2011   Essential hypertension 01/28/2011   Class 1 obesity due to excess calories with serious comorbidity in adult 01/28/2011  PHYSICAL THERAPY DISCHARGE SUMMARY  Visits from Start of Care: 8  Current functional level related to goals / functional outcomes: Decreased pain, improved mobility   Remaining deficits: See above   Education / Equipment: HEP   Patient agrees to discharge. Patient goals were partially met. Patient is being discharged due to a change in medical status. Isabelle Course, PT,DPT11/22/229:17 AM   Kerin Perna, PTA 11/29/20 10:11 AM  [facility] [place], [place], [address] Phone: [phone]   Fax:  [phone]  Name: Idamay Hosein MRN: [id number] Date of Birth: 1960/02/24

## 2020-12-03 ENCOUNTER — Encounter: Payer: 59 | Admitting: Physical Therapy

## 2020-12-05 ENCOUNTER — Encounter: Payer: 59 | Admitting: Physical Therapy

## 2020-12-07 NOTE — Telephone Encounter (Signed)
Pt was contacted via phone to discuss her questions regarding the Humira. Pt states that she has been taking the medication since May. Pt was informed that since she has been on the medication for 6 months now that this is less likely the cause of her symptoms. Pt was was encouraged to follow up with her PCP and what they advise. Pt verbalized understanding with all questions answered.

## 2020-12-09 ENCOUNTER — Encounter: Payer: Self-pay | Admitting: Medical-Surgical

## 2020-12-10 ENCOUNTER — Ambulatory Visit (INDEPENDENT_AMBULATORY_CARE_PROVIDER_SITE_OTHER): Payer: 59

## 2020-12-10 ENCOUNTER — Other Ambulatory Visit: Payer: Self-pay

## 2020-12-10 ENCOUNTER — Telehealth: Payer: Self-pay | Admitting: General Practice

## 2020-12-10 ENCOUNTER — Ambulatory Visit (INDEPENDENT_AMBULATORY_CARE_PROVIDER_SITE_OTHER): Payer: 59 | Admitting: Sports Medicine

## 2020-12-10 DIAGNOSIS — M797 Fibromyalgia: Secondary | ICD-10-CM

## 2020-12-10 DIAGNOSIS — M25511 Pain in right shoulder: Secondary | ICD-10-CM

## 2020-12-10 DIAGNOSIS — G8929 Other chronic pain: Secondary | ICD-10-CM

## 2020-12-10 DIAGNOSIS — M75121 Complete rotator cuff tear or rupture of right shoulder, not specified as traumatic: Secondary | ICD-10-CM | POA: Diagnosis not present

## 2020-12-10 MED ORDER — AMITRIPTYLINE HCL 50 MG PO TABS: ORAL_TABLET | ORAL | 3 refills | Status: DC

## 2020-12-10 NOTE — Assessment & Plan Note (Addendum)
This is an old diagnosis, she typically presents with episodes of widespread aches and pains, she recalls being on a medication in the distant past that we have discovered to be amitriptyline, this seemed to help her widespread aches and pains considerably. She did have an ED visit for widespread aches and pains, placed on prednisone which has helped, it sounds a CBC, CMP, CK level was normal. Restarting amitriptyline at night with a slow up taper. Follow-up with PCP for dose titration in 6 weeks. If insufficient improvement after an adequate dose titration we may consider rheumatoid work-up.

## 2020-12-10 NOTE — Assessment & Plan Note (Addendum)
Megan [name] returns, she is a pleasant 60 year old female, several month history of right shoulder pain, impingement in nature, we did a subacromial injection and formal physical therapy for greater than 6 weeks, x-rays were not revealing. Persistent pain and discomfort, proceeding with MRI for operative planning. We will likely refer to [facility].  Update: MRI performed after the visit shows high-grade cuff tearing, I would like Dr. Griffin Basil to weigh in considering failure of physical therapy and subacromial injections.

## 2020-12-10 NOTE — Progress Notes (Addendum)
    Procedures performed today:    None.  Independent interpretation of notes and tests performed by another provider:   None.  Brief History, Exam, Impression, and Recommendations:    Rotator cuff tear, right Megan [name] returns, she is a pleasant 60 year old female, several month history of right shoulder pain, impingement in nature, we did a subacromial injection and formal physical therapy for greater than 6 weeks, x-rays were not revealing. Persistent pain and discomfort, proceeding with MRI for operative planning. We will likely refer to [facility].  Update: MRI performed after the visit shows high-grade cuff tearing, I would like Dr. Griffin Basil to weigh in considering failure of physical therapy and subacromial injections.  Fibromyalgia This is an old diagnosis, she typically presents with episodes of widespread aches and pains, she recalls being on a medication in the distant past that we have discovered to be amitriptyline, this seemed to help [name] widespread aches and pains considerably. She did have an ED visit for widespread aches and pains, placed on prednisone which has helped, it sounds a CBC, CMP, CK level was normal. Restarting amitriptyline at night with a slow up taper. Follow-up with PCP for dose titration in 6 weeks. If insufficient improvement after an adequate dose titration we may consider rheumatoid work-up.    ___________________________________________ Megan [name]. Megan [name], M.D., ABFM., CAQSM. Primary Care and [place] Instructor of [place] [facility]

## 2020-12-10 NOTE — Telephone Encounter (Signed)
Transition Care Management Follow-up Telephone Call Date of discharge and from where: 12/08/20 from [facility] How have you been since you were released from the hospital? Patient had OV today with Dr. Darene Lamer. Any questions or concerns? No

## 2020-12-10 NOTE — Addendum Note (Signed)
Addended by: Silverio Decamp on: 12/10/2020 12:01 PM   Modules accepted: Orders

## 2020-12-10 NOTE — Telephone Encounter (Signed)
Patient has been scheduled. She is very upset she could not see Joy. She has an appointment this morning with Dr Dianah Field.

## 2020-12-31 ENCOUNTER — Other Ambulatory Visit: Payer: Self-pay

## 2020-12-31 ENCOUNTER — Ambulatory Visit (INDEPENDENT_AMBULATORY_CARE_PROVIDER_SITE_OTHER): Payer: 59 | Admitting: Medical-Surgical

## 2020-12-31 ENCOUNTER — Encounter: Payer: Self-pay | Admitting: Medical-Surgical

## 2020-12-31 VITALS — BP 95/58 | HR 104 | Resp 20 | Ht 64.0 in | Wt 201.0 lb

## 2020-12-31 DIAGNOSIS — R700 Elevated erythrocyte sedimentation rate: Secondary | ICD-10-CM | POA: Diagnosis not present

## 2020-12-31 DIAGNOSIS — R7982 Elevated C-reactive protein (CRP): Secondary | ICD-10-CM | POA: Diagnosis not present

## 2020-12-31 DIAGNOSIS — R52 Pain, unspecified: Secondary | ICD-10-CM | POA: Diagnosis not present

## 2020-12-31 MED ORDER — PREGABALIN 25 MG PO CAPS: ORAL_CAPSULE | ORAL | 0 refills | Status: DC

## 2020-12-31 NOTE — Progress Notes (Signed)
  HPI with pertinent ROS:   CC: Body Aches  HPI: Very pleasant 60 year old female accompanied by her wife presenting for evaluation of continued body aches. For more than a month, she has been experiencing bilateral, diffuse body aches affecting her wrists, hands, hips, knees, ankles, and feet. Notes that she tried to get in at our office for evaluation but was unable to get an appointment. She was seen at the ED but they recommended being seen by her PCP. She is understandably frustrated with this turn of events and is still in quite a bit of discomfort. Cannot remember an inciting event for the pain but reports that it has progressively gotten worse despite her efforts. At the ED, she was given a prednisone taper pack which helped tremendously although she had some hyperactivity as a side effect. A couple of days after the steroid taper was finished, her pain started to return. She was seen by my partner, Dr. Dianah Field, and placed on Amitriptyline 38m nightly as this previously helped her fibromyalgia type pain. Today, she reports the pain is the worst that it has been and has not responded at all to the Amitriptyline. She is taking Ibuprofen regularly without relief. Has not had any other symptoms to accompany the discomfort. Called her GI provider regarding the possibility of it being a side effect of the Humira she is on. Reports that she was told "to get that out of her head" because she had been on the medication too long for it to be a side effect. She is desperate at this point for some relief.   I reviewed the past medical history, family history, social history, surgical history, and allergies today and no changes were needed.  Please see the problem list section below in epic for further details.   Physical exam:   General: Well Developed, well nourished, and in no acute distress.  Neuro: Alert and oriented x3.  HEENT: Normocephalic, atraumatic.  Skin: Warm and dry. Cardiac: Regular  rate and rhythm, no murmurs rubs or gallops, no lower extremity edema.  Respiratory: Clear to auscultation bilaterally. Not using accessory muscles, speaking in full sentences.  Impression and Recommendations:    1. Body aches Has not responded to Amitriptyline and is having significant dry mouth. Would like to stop the medication so recommend tapering down to 231mnightly for 1 week then stop. Response to prednisone seems to indicate widespread inflammation but cannot pinpoint etiology. Getting labs for rheumatoid workup since this hasn't been done. Discussed treatment options for pain. Longer treatment with prednisone is not recommended since she has an upcoming surgery planned. Starting Lyrica with an up taper to the goal of 5058mwice daily. Will follow up closely to determine the need for further dose titration.   Return in about 9 days (around 01/09/2021) for body aches follow up. ___________________________________________ JoyClearnce SorrelNP, APRN, FNP-BC Primary Care and [facility]

## 2021-01-01 ENCOUNTER — Encounter: Payer: Self-pay | Admitting: Medical-Surgical

## 2021-01-01 ENCOUNTER — Other Ambulatory Visit: Payer: Self-pay | Admitting: Medical-Surgical

## 2021-01-01 MED ORDER — GABAPENTIN 300 MG PO CAPS: ORAL_CAPSULE | ORAL | 3 refills | Status: DC

## 2021-01-02 NOTE — Addendum Note (Signed)
Addended bySamuel Bouche on: 01/02/2021 07:43 AM   Modules accepted: Orders

## 2021-01-04 MED ORDER — PREDNISONE 20 MG PO TABS: 20.0000 mg | ORAL_TABLET | Freq: Every day | ORAL | 0 refills | Status: DC

## 2021-01-04 NOTE — Telephone Encounter (Signed)
Patient called and up dated on medication.

## 2021-01-04 NOTE — Addendum Note (Signed)
Addended bySamuel Bouche on: 01/04/2021 10:43 AM   Modules accepted: Orders

## 2021-01-04 NOTE — Progress Notes (Signed)
Prednisone 52m daily for 3 weeks sent to the pharmacy. I will reach out to her in 2-3 weeks to see how she is doing and if she has been called for an appointment with rheumatology yet.

## 2021-01-07 ENCOUNTER — Encounter (HOSPITAL_BASED_OUTPATIENT_CLINIC_OR_DEPARTMENT_OTHER): Payer: Self-pay | Admitting: Orthopaedic Surgery

## 2021-01-07 ENCOUNTER — Other Ambulatory Visit: Payer: Self-pay

## 2021-01-08 LAB — ANA,IFA RA DIAG PNL W/RFLX TIT/PATN
Anti Nuclear Antibody (ANA): POSITIVE — AB
Cyclic Citrullin Peptide Ab: <16 UNITS
Rheumatoid fact SerPl-aCnc: <14 IU/mL (ref <14)

## 2021-01-08 LAB — C-REACTIVE PROTEIN: CRP: 25.6 mg/L — ABNORMAL HIGH (ref <8.0)

## 2021-01-08 LAB — LUPUS ANTICOAGULANT AND CARDIOLIPIN ANTIBODY PANEL WITH REFL
Anticardiolipin IgA: <2 APL-U/mL
Anticardiolipin IgG: <2 GPL-U/mL
Anticardiolipin IgM: <2 MPL-U/mL
PTT-LA Screen: 37 seconds (ref <=40)
dRVVT: 38 seconds (ref <=45)

## 2021-01-08 LAB — ANTI-NUCLEAR AB-TITER (ANA TITER): ANA Titer 1: 1:80 {titer} — ABNORMAL HIGH

## 2021-01-08 LAB — ANTI-DNA ANTIBODY, DOUBLE-STRANDED: ds DNA Ab: <1 IU/mL

## 2021-01-08 LAB — SEDIMENTATION RATE: Sed Rate: 58 mm/h — ABNORMAL HIGH (ref 0–30)

## 2021-01-09 ENCOUNTER — Ambulatory Visit (INDEPENDENT_AMBULATORY_CARE_PROVIDER_SITE_OTHER): Payer: 59 | Admitting: Medical-Surgical

## 2021-01-09 ENCOUNTER — Encounter: Payer: Self-pay | Admitting: Medical-Surgical

## 2021-01-09 DIAGNOSIS — R051 Acute cough: Secondary | ICD-10-CM

## 2021-01-09 DIAGNOSIS — R52 Pain, unspecified: Secondary | ICD-10-CM

## 2021-01-09 MED ORDER — HYDROCOD POLST-CPM POLST ER 10-8 MG/5ML PO SUER: 5.0000 mL | Freq: Two times a day (BID) | ORAL | 0 refills | Status: DC | PRN

## 2021-01-09 MED ORDER — PREDNISONE 10 MG PO TABS: ORAL_TABLET | ORAL | 0 refills | Status: AC

## 2021-01-09 NOTE — Progress Notes (Signed)
Body aches getting better

## 2021-01-09 NOTE — Progress Notes (Signed)
Virtual Visit via Video Note  I connected with Megan [name] on 01/09/21 at  1:00 PM EST by a video enabled telemedicine application and verified that I am speaking with the correct person using two identifiers.   I discussed the limitations of evaluation and management by telemedicine and the availability of in person appointments. The patient expressed understanding and agreed to proceed.  Patient location: patient home Provider locations: provider home  Subjective:    CC: Severe arthralgias follow-up  HPI: Pleasant 60 year old female presenting today for follow-up on severe arthralgias. She is doing much better today since starting the prednisone 40m daily. She does note some hyperactivity since starting on the steroid but no other significant side effects. Her body aches have improved and she is now able to perform most regular activities without difficulty. She has been scheduled with rheumatology for early January. Wonders if she should postpone her upcoming shoulder surgery because she is worried that it will be too much on top of whatever is going on right now.   Has been under the weather with upper respiratory symptoms over the last week as well but thinks this is related to the illness from her grandson. She has a lingering cough that is worse at night and wonders if she can get a cough syrup with will help.     Past medical history, Surgical history, Family history not pertinant except as noted below, Social history, Allergies, and medications have been entered into the medical record, reviewed, and corrections made.   Review of Systems: See HPI for pertinent positives and negatives.   Objective:    General: Speaking clearly in complete sentences without any shortness of breath.  Alert and oriented x3.  Normal judgment. No apparent acute distress.  Impression and Recommendations:    1. Body aches Much improvement with prednisone. Feel that this is consistent with PMR  rather than fibromyalgia at this point. Plan to slow taper prednisone to 16mafter completing 3 weeks of 2031maily. Stay at the 71m58mse for about 3 weeks then decrease to 10mg43mly. Plan to follow up with Rheumatology as scheduled. Recommend calling Dr. VarkeGriffin Basilrding her current issues. With daily prednisone therapy, it may be beneficial to postpone the surgery.   2. Acute cough Tussionex q 12 hours prn. Advised that this will cause drowsiness so aim to use mostly at night until post-viral cough improves.   I discussed the assessment and treatment plan with the patient. The patient was provided an opportunity to ask questions and all were answered. The patient agreed with the plan and demonstrated an understanding of the instructions.   The patient was advised to call back or seek an in-person evaluation if the symptoms worsen or if the condition fails to improve as anticipated.  25 minutes of non-face-to-face time was provided during this encounter.  Return if symptoms worsen or fail to improve.  Beyounce Dickens LClearnce Sorrel, APRN, FNP-BC [facility]

## 2021-01-16 ENCOUNTER — Ambulatory Visit (HOSPITAL_BASED_OUTPATIENT_CLINIC_OR_DEPARTMENT_OTHER): Admission: RE | Admit: 2021-01-16 | Payer: 59 | Source: Home / Self Care | Admitting: Orthopaedic Surgery

## 2021-01-16 DIAGNOSIS — I10 Essential (primary) hypertension: Secondary | ICD-10-CM

## 2021-01-16 HISTORY — DX: Unspecified osteoarthritis, unspecified site: M19.90

## 2021-01-16 HISTORY — DX: Fibromyalgia: M79.7

## 2021-01-16 SURGERY — SHOULDER ARTHROSCOPY WITH SUBACROMIAL DECOMPRESSION, ROTATOR CUFF REPAIR AND BICEP TENDON REPAIR
Anesthesia: Choice | Laterality: Right

## 2021-01-21 ENCOUNTER — Other Ambulatory Visit: Payer: Self-pay | Admitting: Medical-Surgical

## 2021-01-21 DIAGNOSIS — Z6832 Body mass index (BMI) 32.0-32.9, adult: Secondary | ICD-10-CM

## 2021-01-21 DIAGNOSIS — E6609 Other obesity due to excess calories: Secondary | ICD-10-CM

## 2021-02-07 NOTE — Progress Notes (Signed)
°  HPI with pertinent ROS:   CC: 42-monthfollow-up  HPI: Very pleasant 60year old female presenting today for 638-monthollow-up on:  Hypertension-taking hydrochlorothiazide 12.5 mg daily, tolerating well without side effects.  Checking blood pressure at home with readings in the 110s over 70s. Denies CP, SOB, palpitations, lower extremity edema, dizziness, headaches, or vision changes.  GERD-taking Protonix 40 mg daily, tolerating well without side effects.  Symptoms are well managed using this medication.  Weight management-taking Wegovy 2.4 mg weekly as prescribed, tolerating well without side effects.  Unfortunately she has gained some weight with the recent long taper of prednisone and this is very bothersome.  She understands that this is a pros versus cons and verbalizes that she will take being pain-free over worrying about weight gain for now.  Continues to remain active and is still practicing her dietary modifications.  Mood-taking sertraline 50 mg daily, tolerating well without side effects.  Feels that the medication keeps her mood very stable although she is currently having some depression related to having to euthanize her 1540ear old dog.  Seems to be processing her grief over this fairly well although she does have periods of sadness when she sees something that belong to him.  Arthralgias-is currently taking prednisone 50 mg daily as prescribed but will reduce this to 10 mg daily on December 31.  She has an appointment with rheumatology in January where they will address her joint pains and discuss possible treatment.  I reviewed the past medical history, family history, social history, surgical history, and allergies today and no changes were needed.  Please see the problem list section below in epic for further details.   Physical exam:   General: Well Developed, well nourished, and in no acute distress.  Neuro: Alert and oriented x3.  HEENT: Normocephalic, atraumatic.   Skin: Warm and dry. Cardiac: Regular rate and rhythm, no murmurs rubs or gallops, no lower extremity edema.  Respiratory: Clear to auscultation bilaterally. Not using accessory muscles, speaking in full sentences.  Impression and Recommendations:    1. Body aches Continue long-term prednisone taper until rheumatology evaluation.  2. Essential hypertension Well-controlled.  Continue hydrochlorothiazide 12.5 mg daily.  3. Hyperlipidemia, unspecified hyperlipidemia type Checking lipid panel.  4. Anxiety and depression Symptoms well controlled despite recent exacerbation.  Continue sertraline 50 mg daily as prescribed.  5. Prediabetes Continue WeOLMBEM Checking hemoglobin A1c.  6. Encounter for weight management Continue Wegovy.  Discussed weight gain associated with prednisone.  7. Primary insomnia Continue gabapentin as prescribed.  8. Gastroesophageal reflux disease without esophagitis Continue pantoprazole 40 mg daily.  9. Need for shingles vaccine Shingrix No. 1 given in office today.  Next will be due in 2-6 months. - Varicella-zoster vaccine IM (Shingrix)  Return in about 6 months (around 08/09/2021) for chronic disease follow up. ___________________________________________ JoClearnce SorrelDNP, APRN, FNP-BC Primary Care and [place]

## 2021-02-08 ENCOUNTER — Ambulatory Visit (INDEPENDENT_AMBULATORY_CARE_PROVIDER_SITE_OTHER): Payer: 59 | Admitting: Medical-Surgical

## 2021-02-08 ENCOUNTER — Encounter: Payer: Self-pay | Admitting: Medical-Surgical

## 2021-02-08 ENCOUNTER — Other Ambulatory Visit: Payer: Self-pay

## 2021-02-08 VITALS — BP 118/79 | HR 86 | Resp 20 | Ht 64.0 in | Wt 199.0 lb

## 2021-02-08 DIAGNOSIS — F419 Anxiety disorder, unspecified: Secondary | ICD-10-CM | POA: Diagnosis not present

## 2021-02-08 DIAGNOSIS — F32A Depression, unspecified: Secondary | ICD-10-CM

## 2021-02-08 DIAGNOSIS — Z23 Encounter for immunization: Secondary | ICD-10-CM | POA: Diagnosis not present

## 2021-02-08 DIAGNOSIS — R52 Pain, unspecified: Secondary | ICD-10-CM | POA: Diagnosis not present

## 2021-02-08 DIAGNOSIS — Z7689 Persons encountering health services in other specified circumstances: Secondary | ICD-10-CM

## 2021-02-08 DIAGNOSIS — E785 Hyperlipidemia, unspecified: Secondary | ICD-10-CM

## 2021-02-08 DIAGNOSIS — R7303 Prediabetes: Secondary | ICD-10-CM

## 2021-02-08 DIAGNOSIS — I10 Essential (primary) hypertension: Secondary | ICD-10-CM | POA: Diagnosis not present

## 2021-02-08 DIAGNOSIS — F5101 Primary insomnia: Secondary | ICD-10-CM

## 2021-02-08 DIAGNOSIS — K219 Gastro-esophageal reflux disease without esophagitis: Secondary | ICD-10-CM

## 2021-02-09 ENCOUNTER — Other Ambulatory Visit: Payer: Self-pay | Admitting: Medical-Surgical

## 2021-02-09 DIAGNOSIS — F325 Major depressive disorder, single episode, in full remission: Secondary | ICD-10-CM

## 2021-02-13 ENCOUNTER — Encounter: Payer: Self-pay | Admitting: Medical-Surgical

## 2021-02-13 LAB — HEMOGLOBIN A1C
Hgb A1c MFr Bld: 5.5 % of total Hgb (ref <5.7)
Mean Plasma Glucose: 111 mg/dL
eAG (mmol/L): 6.2 mmol/L

## 2021-02-13 LAB — LIPID PANEL
Cholesterol: 220 mg/dL — ABNORMAL HIGH (ref <200)
HDL: 94 mg/dL (ref >=50)
LDL Cholesterol (Calc): 91 mg/dL (calc)
Non-HDL Cholesterol (Calc): 126 mg/dL (calc) (ref <130)
Total CHOL/HDL Ratio: 2.3 (calc) (ref <5.0)
Triglycerides: 264 mg/dL — ABNORMAL HIGH (ref <150)

## 2021-02-15 ENCOUNTER — Other Ambulatory Visit: Payer: Self-pay | Admitting: Medical-Surgical

## 2021-02-15 DIAGNOSIS — Z6832 Body mass index (BMI) 32.0-32.9, adult: Secondary | ICD-10-CM

## 2021-02-20 ENCOUNTER — Other Ambulatory Visit: Payer: Self-pay

## 2021-02-20 ENCOUNTER — Ambulatory Visit (INDEPENDENT_AMBULATORY_CARE_PROVIDER_SITE_OTHER): Payer: 59 | Admitting: Internal Medicine

## 2021-02-20 ENCOUNTER — Encounter: Payer: Self-pay | Admitting: Internal Medicine

## 2021-02-20 VITALS — BP 114/75 | HR 98 | Resp 16 | Ht 63.0 in | Wt 200.0 lb

## 2021-02-20 DIAGNOSIS — K5090 Crohn's disease, unspecified, without complications: Secondary | ICD-10-CM | POA: Insufficient documentation

## 2021-02-20 DIAGNOSIS — R7689 Other specified abnormal immunological findings in serum: Secondary | ICD-10-CM

## 2021-02-20 DIAGNOSIS — M2540 Effusion, unspecified joint: Secondary | ICD-10-CM | POA: Diagnosis not present

## 2021-02-20 DIAGNOSIS — R768 Other specified abnormal immunological findings in serum: Secondary | ICD-10-CM | POA: Diagnosis not present

## 2021-02-20 DIAGNOSIS — K50919 Crohn's disease, unspecified, with unspecified complications: Secondary | ICD-10-CM

## 2021-02-20 DIAGNOSIS — M797 Fibromyalgia: Secondary | ICD-10-CM

## 2021-02-20 NOTE — Progress Notes (Signed)
Office Visit Note  Patient: Megan [name]             Date of Birth: 08-09-1960           MRN: [id number]             PCP: Samuel Bouche, NP Referring: Samuel Bouche, NP Visit Date: 02/20/2021   Subjective:  New Patient (Initial Visit) (Abnormal labs)   History of Present Illness: Megan [name] is a 61 y.o. female here for evaluation of joint pain in multiple areas ongoing since October with high inflammatory markers and currently treated on low to moderate prednisone. She has a history of crohn's diease diagnosed on colonoscopy last year and now on Humira treatment.  She did not have any of the current severe joint pains or swelling at that time before starting treatment.  But she does have known osteoarthritis of multiple sites and had planned right shoulder surgery for rotator cuff arthropathy but delayed due to recent symptoms.  After initial onset of symptoms in October she noticed swelling of the right wrist and in bilateral knees this quickly resolved after starting prednisone at 20 mg daily.  Her joint pain was completely improved within days.  After prednisone taper down to 10 mg she still feels much better than before initial onset and had no recurrence of swelling but does notice some stiffness particularly first thing in the morning.  Labs reviewed 12/2020 ANA 1:80 DFS dsDNA neg ACA, LA neg RF neg CCP neg ESR 58 CRP 25.6   Activities of Daily Living:  Patient reports morning stiffness for 1 hour.   Patient Reports nocturnal pain.  Difficulty dressing/grooming: Denies Difficulty climbing stairs: Denies Difficulty getting out of chair: Reports Difficulty using hands for taps, buttons, cutlery, and/or writing: Reports  Review of Systems  Constitutional:  Positive for fatigue.  HENT:  Positive for mouth dryness.   Eyes:  Negative for dryness.  Respiratory:  Negative for shortness of breath.   Cardiovascular:  Negative for swelling in legs/feet.  Gastrointestinal:   Negative for constipation.  Endocrine: Positive for heat intolerance.  Genitourinary:  Negative for difficulty urinating.  Musculoskeletal:  Positive for joint pain, joint pain, joint swelling, muscle weakness, morning stiffness and muscle tenderness.  Skin:  Negative for rash.  Allergic/Immunologic: Positive for susceptible to infections.  Neurological:  Negative for numbness.  Hematological:  Negative for bruising/bleeding tendency.  Psychiatric/Behavioral:  Positive for sleep disturbance.    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   Hiatal hernia with GERD 01/11/2018   Postmenopausal 10/07/2017   Encounter for medication management 10/07/2017   Atelectasis of left lung 09/08/2017   Pruritus 09/07/2017   Uterine leiomyoma 07/17/2017   Abnormal uterine bleeding (AUB) 07/17/2017   Prediabetes 07/09/2017   Anxiety and depression 06/15/2017   Menopausal vasomotor syndrome 06/04/2017   Gastroesophageal reflux disease without esophagitis 03/07/2014   Insomnia 02/06/2012   Hyperlipidemia 07/29/2011   Essential hypertension 01/28/2011   Class 1 obesity due  to excess calories with serious comorbidity in adult 01/28/2011    Past Medical History:  Diagnosis Date   Acute respiratory failure (HCC)    Arthritis    Crohn's disease ([place])    Fibromyalgia    GERD (gastroesophageal reflux disease)    Hypertension    Menopausal vasomotor syndrome    Obesity     Pre-diabetes    RSV (respiratory syncytial virus pneumonia)     Family History  Problem Relation Age of Onset   Heart disease Mother    Hypertension Mother    Heart disease Father    Colon polyps Father    Breast cancer Maternal Grandmother    Colon cancer Neg Hx    Esophageal cancer Neg Hx    Rectal cancer Neg Hx    Stomach cancer Neg Hx    Past Surgical History:  Procedure Laterality Date   ANKLE ARTHROSCOPY Left 1985   COLONOSCOPY  2011   IN [place]=diverticulosis   DILATION AND CURETTAGE OF UTERUS N/A 08/14/2017   Procedure: DILATATION AND CURETTAGE;  Surgeon: Emily Filbert, MD;  Location: [facility];  Service: Gynecology;  Laterality: N/A;   REPLACEMENT TOTAL KNEE Right 2021   Social History   Social History Narrative   Lives at home with wife and teenage daughter   Immunization History  Administered Date(s) Administered   Influenza,inj,Quad PF,6+ Mos 12/08/2016, 12/26/2017, 12/11/2018   Influenza-Unspecified 12/26/2017, 11/17/2020   PFIZER(Purple Top)SARS-COV-2 Vaccination 04/27/2019, 05/18/2019, 12/21/2019   Td 12/30/2006   Tdap 09/16/2018   Zoster Recombinat (Shingrix) 02/08/2021     Objective: Vital Signs: BP 114/75 (BP Location: Left Arm, Patient Position: Sitting, Cuff Size: Normal)    Pulse 98    Resp 16    Ht 5' 3" (1.6 m)    Wt 200 lb (90.7 kg)    BMI 35.43 kg/m    Physical Exam Constitutional:      Appearance: She is obese.  HENT:     Right Ear: External ear normal.     Left Ear: External ear normal.     Mouth/Throat:     Mouth: Mucous membranes are moist.     Pharynx: Oropharynx is clear.  Eyes:     Conjunctiva/sclera: Conjunctivae normal.  Cardiovascular:     Rate and Rhythm: Normal rate and regular rhythm.  Pulmonary:     Effort: Pulmonary effort is normal.     Breath sounds: Normal breath sounds.  Musculoskeletal:     Right lower leg: No edema.     Left lower leg: No edema.  Skin:    General: Skin is warm and dry.      Findings: No rash.  Neurological:     General: No focal deficit present.     Mental Status: She is alert.     Deep Tendon Reflexes: Reflexes normal.  Psychiatric:        Mood and Affect: Mood normal.     Musculoskeletal Exam:  Neck full ROM no tenderness Shoulders full ROM, right slightly restricted ROM behind back and with external rotation while abducted Elbows full ROM no tenderness or swelling Wrists full ROM no tenderness or swelling Fingers full ROM no tenderness or swelling, bony nodules present worst at thumb MCPs No paraspinal tenderness to palpation over upper and lower back Hip FADIR and FABER provoke lateral hip pain, no lateral hip tenderness to palpation Knees full ROM no tenderness or swelling Ankles full ROM no tenderness or swelling   Investigation: No additional findings.  Imaging: No results found.  Recent Labs: Lab Results  Component Value Date   WBC 5.5 09/04/2020   HGB 12.8 09/04/2020   PLT 212.0 09/04/2020   NA 139 09/04/2020   K 4.6 09/04/2020   CL 99 09/04/2020   CO2 33 (H) 09/04/2020   GLUCOSE 93 09/04/2020   BUN 14 09/04/2020   CREATININE 0.77 09/04/2020   BILITOT 0.7 09/04/2020   ALKPHOS 65 09/04/2020   AST 18 09/04/2020   ALT 14 09/04/2020   PROT 7.0 09/04/2020   ALBUMIN 4.5 09/04/2020   CALCIUM 9.9 09/04/2020   GFRAA 95 05/19/2019   QFTBGOLDPLUS NEGATIVE 05/25/2020    Speciality Comments: No specialty comments available.  Procedures:  No procedures performed Allergies: Patient has no known allergies.   Assessment / Plan:     Visit Diagnoses: Positive ANA (antinuclear antibody)  Joint swelling - Plan: RNP Antibody, Anti-Smith antibody, Sjogrens syndrome-A extractable nuclear antibody, Sedimentation rate, C-reactive protein  Positive ANA at fairly low titer no specific inflammatory symptoms on exam today but this is on a medium dose of prednisone.  We will check antibodies as above also repeat inflammatory markers.  I do not  think the current symptoms are consistent with PMR due to the described joint swelling and some peripheral joint involvement. Agree with continuing prednisone at 10 mg daily for now.  Fibromyalgia  History of fibromyalgia with some generalized pain and also has known rotator cuff disease and generalized osteoarthritis as causes of chronic pain.  These do not seem likely to explain her symptoms of swelling and very high inflammatory markers.  Recent symptoms were not very much improved with the trial of amitriptyline.  She is already on Zoloft so would not add additional medicine such as SNRI.  Crohn's disease with complication, unspecified gastrointestinal tract location [facility])  Bowel disease symptoms appear very well controlled no abdominal pain no diarrhea no recurrence or flareup ever since starting Humira treatment.  She is very concerned about medication reactions or whether this is completely treating her disease we will check antiadalimumab antibody titer today.  I discussed drug-induced autoimmune disease from TNF inhibitors usually has a different antibody profile then her work-up so far suggests and would usually have local or distant cutaneous reactions as well making this very unlikely.  Orders: Orders Placed This Encounter  Procedures   RNP Antibody   Anti-Smith antibody   Sjogrens syndrome-A extractable nuclear antibody   Sedimentation rate   C-reactive protein   Adalimumab Anti-drug Antibody for IBD   No orders of the defined types were placed in this encounter.    Follow-Up Instructions: No follow-ups on file.   Collier Salina, MD  Note - This record has been created using [employer].  Chart creation errors have been sought, but may not always  have been located. Such creation errors do not reflect on  the standard of medical care.

## 2021-02-27 ENCOUNTER — Other Ambulatory Visit: Payer: Self-pay | Admitting: Medical-Surgical

## 2021-02-27 DIAGNOSIS — Z1231 Encounter for screening mammogram for malignant neoplasm of breast: Secondary | ICD-10-CM

## 2021-02-28 ENCOUNTER — Other Ambulatory Visit: Payer: Self-pay | Admitting: Medical-Surgical

## 2021-02-28 NOTE — Telephone Encounter (Signed)
Not sure if this refill is appropriate. Please advise, thanks.

## 2021-03-01 ENCOUNTER — Encounter: Payer: Self-pay | Admitting: Internal Medicine

## 2021-03-01 LAB — SJOGRENS SYNDROME-A EXTRACTABLE NUCLEAR ANTIBODY: SSA (Ro) (ENA) Antibody, IgG: <1 AI

## 2021-03-01 LAB — ADALIMUMAB ANTI-DRUG ANTIBODY FOR IBD: ADALIMUMAB ADA,IBD: >100 AU — ABNORMAL HIGH (ref <10)

## 2021-03-01 LAB — RNP ANTIBODY: Ribonucleic Protein(ENA) Antibody, IgG: <1 AI

## 2021-03-01 LAB — SEDIMENTATION RATE: Sed Rate: 28 mm/h (ref 0–30)

## 2021-03-01 LAB — ANTI-SMITH ANTIBODY: ENA SM Ab Ser-aCnc: <1 AI

## 2021-03-01 LAB — C-REACTIVE PROTEIN: CRP: 2 mg/L (ref <8.0)

## 2021-03-01 MED ORDER — PREDNISONE 10 MG PO TABS: 10.0000 mg | ORAL_TABLET | Freq: Every day | ORAL | 0 refills | Status: DC

## 2021-03-01 NOTE — Telephone Encounter (Signed)
Next Visit: 03/13/2021  Last Visit: 02/20/2021  Last Fill: Dr. Benjamine Mola has not refilled.   DX: Positive ANA (antinuclear antibody)   Current Dose per office note on 02/20/2021: Agree with continuing prednisone at 10 mg daily for now.   Okay to refill prednisone?

## 2021-03-01 NOTE — Telephone Encounter (Signed)
Patient advised.

## 2021-03-12 NOTE — Progress Notes (Signed)
Office Visit Note  Patient: Megan [name]             Date of Birth: 02-26-1960           MRN: [id number]             PCP: Samuel Bouche, NP Referring: Samuel Bouche, NP Visit Date: 03/13/2021   Subjective:  No chief complaint on file.   History of Present Illness: Megan [name] is a 61 y.o. female here for follow up with joint pain and swelling with elevated inflammatory markers. We continued treatment for now with prednisone 10 mg daily. She was concerned about Humira contributing to problems this was not felt to be the case by her gastroenterologist and her crohn's disease has been well controlled. Lab test at last visit did show high anti-drug antibody titer. She feels some pain in bilateral shoulders and in her low back and hips worst affected, this is doing worse with the cold and weather changes. Swelling has been well controlled no recurrence with the current prednisone dose.  Previous HPI 02/20/21 Megan [name] is a 61 y.o. female here for evaluation of joint pain in multiple areas ongoing since October with high inflammatory markers and currently treated on low to moderate prednisone. She has a history of crohn's diease diagnosed on colonoscopy last year and now on Humira treatment.  She did not have any of the current severe joint pains or swelling at that time before starting treatment.  But she does have known osteoarthritis of multiple sites and had planned right shoulder surgery for rotator cuff arthropathy but delayed due to recent symptoms.  After initial onset of symptoms in October she noticed swelling of the right wrist and in bilateral knees this quickly resolved after starting prednisone at 20 mg daily.  Her joint pain was completely improved within days.  After prednisone taper down to 10 mg she still feels much better than before initial onset and had no recurrence of swelling but does notice some stiffness particularly first thing in the morning.   Review of Systems   Constitutional:  Positive for fatigue.  HENT:  Negative for mouth sores, mouth dryness and nose dryness.   Eyes:  Negative for pain, itching and dryness.  Respiratory:  Negative for shortness of breath and difficulty breathing.   Cardiovascular:  Negative for chest pain and palpitations.  Gastrointestinal:  Positive for constipation. Negative for blood in stool and diarrhea.  Endocrine: Negative for increased urination.  Genitourinary:  Negative for difficulty urinating.  Musculoskeletal:  Positive for joint pain, joint pain, joint swelling, myalgias, morning stiffness, muscle tenderness and myalgias.  Skin:  Negative for color change, rash and redness.  Allergic/Immunologic: Negative for susceptible to infections.  Neurological:  Negative for dizziness, numbness, headaches, memory loss and weakness.  Hematological:  Negative for bruising/bleeding tendency.  Psychiatric/Behavioral:  Negative for confusion.    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   Hiatal hernia with  GERD 01/11/2018   Postmenopausal 10/07/2017   Encounter for medication management 10/07/2017   Atelectasis of left lung 09/08/2017   Pruritus 09/07/2017   Uterine leiomyoma 07/17/2017   Abnormal uterine bleeding (AUB) 07/17/2017   Prediabetes 07/09/2017   Anxiety and depression 06/15/2017   Menopausal vasomotor syndrome 06/04/2017   Gastroesophageal reflux  disease without esophagitis 03/07/2014   Insomnia 02/06/2012   Hyperlipidemia 07/29/2011   Essential hypertension 01/28/2011   Class 1 obesity due to excess calories with serious comorbidity in adult 01/28/2011    Past Medical History:  Diagnosis Date   Acute respiratory failure ([place])    Arthritis    Crohn's disease ([place])    Fibromyalgia    GERD (gastroesophageal reflux disease)    Hypertension    Menopausal vasomotor syndrome    Obesity    Pre-diabetes    RSV (respiratory syncytial virus pneumonia)     Family History  Problem Relation Age of Onset   Heart disease Mother    Hypertension Mother    Heart disease Father    Colon polyps Father    Breast cancer Maternal Grandmother    Colon cancer Neg Hx    Esophageal cancer Neg Hx    Rectal cancer Neg Hx    Stomach cancer Neg Hx    Past Surgical History:  Procedure Laterality Date   ANKLE ARTHROSCOPY Left 1985   COLONOSCOPY  2011   IN [place]=diverticulosis   DILATION AND CURETTAGE OF UTERUS N/A 08/14/2017   Procedure: DILATATION AND CURETTAGE;  Surgeon: Emily Filbert, MD;  Location: [facility];  Service: Gynecology;  Laterality: N/A;   REPLACEMENT TOTAL KNEE Right 2021   Social History   Social History Narrative   Lives at home with wife and teenage daughter   Immunization History  Administered Date(s) Administered   Influenza,inj,Quad PF,6+ Mos 12/08/2016, 12/26/2017, 12/11/2018   Influenza-Unspecified 12/26/2017, 11/17/2020   PFIZER(Purple Top)SARS-COV-2 Vaccination 04/27/2019, 05/18/2019, 12/21/2019   Td 12/30/2006   Tdap 09/16/2018   Zoster Recombinat (Shingrix) 02/08/2021     Objective: Vital Signs: BP 111/80 (BP Location: Left Arm, Patient Position: Sitting, Cuff Size: Large)    Pulse 84    Ht 5' 3"  (1.6 m)    Wt 202 lb 3.2 oz (91.7 kg)    BMI 35.82 kg/m    Physical Exam Constitutional:      Appearance: She is obese.  Cardiovascular:     Rate and Rhythm: Normal rate and regular  rhythm.  Pulmonary:     Effort: Pulmonary effort is normal.     Breath sounds: Normal breath sounds.  Musculoskeletal:     Right lower leg: No edema.     Left lower leg: No edema.  Skin:    General: Skin is warm and dry.     Findings: No rash.  Neurological:     Mental Status: She is alert.  Psychiatric:        Mood and Affect: Mood normal.     Musculoskeletal Exam:  Shoulder to elbow pain provoked with speeds, ferguson's, resisted abduction, tenderness over bicipital groove no palpable swelling Wrists full ROM no tenderness or swelling Fingers full ROM no tenderness or swelling No paraspinal tenderness to palpation over low back Knees full ROM no tenderness or swelling, left knee crepitus   Investigation: No additional findings.  Imaging: No results found.  Recent Labs: Lab Results  Component Value Date   WBC 6.9 03/25/2021   HGB 11.5 (L) 03/25/2021   PLT 257.0 03/25/2021  NA 141 03/25/2021   K 4.2 03/25/2021   CL 102 03/25/2021   CO2 35 (H) 03/25/2021   GLUCOSE 85 03/25/2021   BUN 12 03/25/2021   CREATININE 0.75 03/25/2021   BILITOT 0.5 03/25/2021   ALKPHOS 45 03/25/2021   AST 13 03/25/2021   ALT 11 03/25/2021   PROT 6.6 03/25/2021   ALBUMIN 4.2 03/25/2021   CALCIUM 9.5 03/25/2021   GFRAA 95 05/19/2019   QFTBGOLDPLUS NEGATIVE 05/25/2020    Speciality Comments: No specialty comments available.  Procedures:  No procedures performed Allergies: Patient has no known allergies.   Assessment / Plan:     Visit Diagnoses:  Positive ANA (antinuclear antibody) Joint swelling Crohn's disease with complication, unspecified gastrointestinal tract location [facility])  Nonspecific exam findings for inflammatory disease. I do not suspect additional underlying autoimmune process. Cannot completely exclude some extraintestinal manifestations from her known IBD, although IBD appears to be doing pretty well. She reports some diarrhea symptoms though. Would not strongly  recommend changing treatment regimen for somewhat unclear arthralgias unless symptoms change. We will need her to taper off the prednisone 10 mg daily this may provoke more apparent symptoms. Checking ADA trough level though with her having high anti-drug antibodies titer.  Nontraumatic complete tear of right rotator cuff  Shoulder and upper arm pain consistent with rotator cuff arthropathy.  Primary osteoarthritis of right knee  Right knee is well now. Left knee crepitus but not as advanced symptoms compared to her right before replacement.  Adalimumab long-term use - Plan: Adalimumab Level for IBD  Fibromyalgia  Fairly diffuse body pains currently better with steroids but will need to reduce this. Has several existing structural causes for joint pain as well.  Orders: Orders Placed This Encounter  Procedures   Adalimumab Level for IBD   No orders of the defined types were placed in this encounter.    Follow-Up Instructions: No follow-ups on file.   Collier Salina, MD  Note - This record has been created using [employer].  Chart creation errors have been sought, but may not always  have been located. Such creation errors do not reflect on  the standard of medical care.

## 2021-03-13 ENCOUNTER — Encounter: Payer: Self-pay | Admitting: Internal Medicine

## 2021-03-13 ENCOUNTER — Other Ambulatory Visit: Payer: Self-pay

## 2021-03-13 ENCOUNTER — Ambulatory Visit (INDEPENDENT_AMBULATORY_CARE_PROVIDER_SITE_OTHER): Payer: 59 | Admitting: Internal Medicine

## 2021-03-13 VITALS — BP 111/80 | HR 84 | Ht 63.0 in | Wt 202.2 lb

## 2021-03-13 DIAGNOSIS — M1711 Unilateral primary osteoarthritis, right knee: Secondary | ICD-10-CM

## 2021-03-13 DIAGNOSIS — M75121 Complete rotator cuff tear or rupture of right shoulder, not specified as traumatic: Secondary | ICD-10-CM | POA: Diagnosis not present

## 2021-03-13 DIAGNOSIS — R768 Other specified abnormal immunological findings in serum: Secondary | ICD-10-CM | POA: Diagnosis not present

## 2021-03-13 DIAGNOSIS — M797 Fibromyalgia: Secondary | ICD-10-CM

## 2021-03-13 DIAGNOSIS — M2540 Effusion, unspecified joint: Secondary | ICD-10-CM | POA: Diagnosis not present

## 2021-03-13 DIAGNOSIS — K50919 Crohn's disease, unspecified, with unspecified complications: Secondary | ICD-10-CM

## 2021-03-13 DIAGNOSIS — Z79620 Long term (current) use of immunosuppressive biologic: Secondary | ICD-10-CM

## 2021-03-15 ENCOUNTER — Other Ambulatory Visit: Payer: Self-pay | Admitting: Medical-Surgical

## 2021-03-15 DIAGNOSIS — E6609 Other obesity due to excess calories: Secondary | ICD-10-CM

## 2021-03-15 DIAGNOSIS — Z6832 Body mass index (BMI) 32.0-32.9, adult: Secondary | ICD-10-CM

## 2021-03-18 ENCOUNTER — Encounter: Payer: Self-pay | Admitting: Medical-Surgical

## 2021-03-19 ENCOUNTER — Telehealth: Payer: Self-pay

## 2021-03-19 NOTE — Telephone Encounter (Signed)
Medication: WEGOVY 2.4 MG/0.75ML SOAJ Prior authorization submitted via CoverMyMeds on 03/19/2021 [place] submission pending

## 2021-03-21 LAB — ADALIMUMAB LEVEL FOR IBD: ADALIMUMAB LEVEL,IBD: <0.8 ug/mL

## 2021-03-22 NOTE — Progress Notes (Signed)
The blood test for Humira level shows no detectable medicine in her system. Based on the high anti-Humira antibodies I suspect she is developing resistance to this medicine and might benefit to consider switching medicine. I will send a message to her GI doctor since she has a visit coming up.

## 2021-03-25 ENCOUNTER — Ambulatory Visit (INDEPENDENT_AMBULATORY_CARE_PROVIDER_SITE_OTHER): Payer: 59 | Admitting: Gastroenterology

## 2021-03-25 ENCOUNTER — Other Ambulatory Visit (INDEPENDENT_AMBULATORY_CARE_PROVIDER_SITE_OTHER): Payer: 59

## 2021-03-25 ENCOUNTER — Other Ambulatory Visit: Payer: Self-pay

## 2021-03-25 ENCOUNTER — Encounter: Payer: Self-pay | Admitting: Gastroenterology

## 2021-03-25 VITALS — BP 122/82 | HR 75 | Ht 63.0 in | Wt 205.0 lb

## 2021-03-25 DIAGNOSIS — K449 Diaphragmatic hernia without obstruction or gangrene: Secondary | ICD-10-CM

## 2021-03-25 DIAGNOSIS — K50019 Crohn's disease of small intestine with unspecified complications: Secondary | ICD-10-CM

## 2021-03-25 DIAGNOSIS — K219 Gastro-esophageal reflux disease without esophagitis: Secondary | ICD-10-CM | POA: Diagnosis not present

## 2021-03-25 LAB — CBC
HCT: 36.8 % (ref 36.0–46.0)
Hemoglobin: 11.5 g/dL — ABNORMAL LOW (ref 12.0–15.0)
MCHC: 31.2 g/dL (ref 30.0–36.0)
MCV: 75.7 fl — ABNORMAL LOW (ref 78.0–100.0)
Platelets: 257 10*3/uL (ref 150.0–400.0)
RBC: 4.85 Mil/uL (ref 3.87–5.11)
RDW: 17.9 % — ABNORMAL HIGH (ref 11.5–15.5)
WBC: 6.9 10*3/uL (ref 4.0–10.5)

## 2021-03-25 LAB — COMPREHENSIVE METABOLIC PANEL
ALT: 11 U/L (ref 0–35)
AST: 13 U/L (ref 0–37)
Albumin: 4.2 g/dL (ref 3.5–5.2)
Alkaline Phosphatase: 45 U/L (ref 39–117)
BUN: 12 mg/dL (ref 6–23)
CO2: 35 mEq/L — ABNORMAL HIGH (ref 19–32)
Calcium: 9.5 mg/dL (ref 8.4–10.5)
Chloride: 102 mEq/L (ref 96–112)
Creatinine, Ser: 0.75 mg/dL (ref 0.40–1.20)
GFR: 86.02 mL/min (ref >60.00)
Glucose, Bld: 85 mg/dL (ref 70–99)
Potassium: 4.2 mEq/L (ref 3.5–5.1)
Sodium: 141 mEq/L (ref 135–145)
Total Bilirubin: 0.5 mg/dL (ref 0.2–1.2)
Total Protein: 6.6 g/dL (ref 6.0–8.3)

## 2021-03-25 LAB — C-REACTIVE PROTEIN: CRP: <1 mg/dL (ref 0.5–20.0)

## 2021-03-25 LAB — VITAMIN B12: Vitamin B-12: 340 pg/mL (ref 211–911)

## 2021-03-25 LAB — HEPATITIS B SURFACE ANTIGEN: Hepatitis B Surface Ag: NONREACTIVE

## 2021-03-25 NOTE — Progress Notes (Signed)
Chief Complaint: FU  Referring Provider:  Samuel Bouche, NP      ASSESSMENT AND PLAN;    #1. Ileal Crohn's Disease with stricture. Dx 03/2020 on colon. Assoc IDA.  Started Humira 07/2020. Developed Abs #2. GERD with large HH   Plan: - Check CBC, CMP, CRP, B12. TB gold, HBsAg -Switch Humira to Entiyo 366m (week 0, 2, 6), then 3067mIV Q8weeks. - Continue Protonix 40 mg p.o. QD - Avoid NSAIDs.    HPI:    JuDanilynn Jemisons a [age].o. female   FU   Diarrhea Occ 2-3/day every 2-3 days. Cramps with lower abdominal discomfort. No melena or hematochezia.  Had done very well with Humira until recently when her level was undetectable with positive antibodies s/o rejection.  Denies having any upper GI symptoms.  She got Shingrix vaccine from PCP.   No nausea or vomiting.  No history suggestive of inflammatory arthritis.  No skin rash.  Past GI work-up:  Colonoscopy 04/10/2020 - Two 2 to 4 mm polyps in the proximal ascending colon and in the mid ascending colon, removed with a cold biopsy forceps. Resected and retrieved. - Moderate predominantly sigmoid diverticulosis. - Terminal ileitis with terminal ileal stricture-highly suggestive of Crohn's disease. - The examination was otherwise normal on direct and retroflexion views.  EGD 05/2020 - Large hiatal hernia. - Gastritis. Biopsied. - A single gastric polyp. Biopsied.  CTE 05/2020: 1.  CT Enterography of the abdomen and pelvis shows 2 areas of involvement of the ileum.  2.  Distal segment of ileum shows mucosal enhancement consistent with active disease. Incomplete distention of this area may be related to spasm. Structures not excluded. No evidence of obstruction.  3.  Mucosal enhancement region of the terminal ileum consistent with active disease. Incomplete distention of this area may be related to spasm. Structures not excluded. No evidence of obstruction.  4.  Possible active disease proximal portion of the  appendix. No periappendiceal inflammation or enlargement of the appendix.  5.  No evidence of fistula formation or abscess.  6.  Large sliding hiatal hernia.  Past Medical History:  Diagnosis Date   Acute respiratory failure (HCC)    Arthritis    Crohn's disease ([facility]   Fibromyalgia    GERD (gastroesophageal reflux disease)    Hypertension    Menopausal vasomotor syndrome    Obesity    Pre-diabetes    RSV (respiratory syncytial virus pneumonia)     Past Surgical History:  Procedure Laterality Date   ANKLE ARTHROSCOPY Left 1985   COLONOSCOPY  2011   IN [place]=diverticulosis   DILATION AND CURETTAGE OF UTERUS N/A 08/14/2017   Procedure: DILATATION AND CURETTAGE;  Surgeon: DoEmily FilbertMD;  [place] Service: [place]cology;  Laterality: N/A;   REPLACEMENT TOTAL KNEE Right 2021    Family History  Problem Relation Age of Onset   Heart disease Mother    Hypertension Mother    Heart disease Father    Colon polyps Father    Breast cancer Maternal Grandmother    Colon cancer Neg Hx    Esophageal cancer Neg Hx    Rectal cancer Neg Hx    Stomach cancer Neg Hx     Social History   Tobacco Use   Smoking status: Never   Smokeless tobacco: Never  Vaping Use   Vaping Use: Never used  Substance Use Topics   Alcohol use: Not Currently   Drug use: Never  Current Outpatient Medications  Medication Sig Dispense Refill   Adalimumab (HUMIRA) 40 MG/0.4ML PSKT Inject 1 pen into the skin every 14 (fourteen) days. 2 each 10   amitriptyline (ELAVIL) 50 MG tablet TAKE 1/2 TABLET BY MOUTH EVERY NIGHT AT BEDTIME FOR 1 WEEK THEN TAKE 1 TABLET BY MOUTH EVERY NIGHT AT BEDTIME     gabapentin (NEURONTIN) 300 MG capsule One tab PO qHS for a week, then BID for a week, then TID. May double weekly to a max of 3,665m/day 180 capsule 3   hydrochlorothiazide (HYDRODIURIL) 25 MG tablet Take 1 tablet (25 mg total) by mouth daily. (Patient taking differently: Take 12.5  tablets by mouth daily.) 90 tablet 1   predniSONE (DELTASONE) 10 MG tablet Take 1 tablet (10 mg total) by mouth daily with breakfast. 30 tablet 0   sertraline (ZOLOFT) 50 MG tablet TAKE 1 TABLET(50 MG) BY MOUTH DAILY 90 tablet 1   WEGOVY 2.4 MG/0.75ML SOAJ INJECT 1 PEN SUBCUTANEOUSLY ONCE A WEEK 4 mL 0   No current facility-administered medications for this visit.    No Known Allergies  Review of Systems:  neg     Physical Exam:    BP 122/82    Pulse 75    Ht 5' 3"  (1.6 m)    Wt 205 lb (93 kg)    SpO2 98%    BMI 36.31 kg/m  Wt Readings from Last 3 Encounters:  03/25/21 205 lb (93 kg)  03/13/21 202 lb 3.2 oz (91.7 kg)  02/20/21 200 lb (90.7 kg)   Constitutional:  Well-developed, in no acute distress. Psychiatric: Normal mood and affect. Behavior is normal. HEENT: Pupils normal.  Conjunctivae are normal. No scleral icterus. Abdominal: Soft, nondistended. Nontender. Bowel sounds active throughout. There are no masses palpable. No hepatomegaly. Rectal: Deferred Neurological: Alert and oriented to person place and time. Skin: Skin is warm and dry. No rashes noted.  Data Reviewed: I have personally reviewed following labs and imaging studies  CBC: CBC Latest Ref Rng & Units 09/04/2020 05/25/2020 05/19/2019  WBC 4.0 - 10.5 K/uL 5.5 4.5 5.2  Hemoglobin 12.0 - 15.0 g/dL 12.8 11.3(L) 10.9(L)  Hematocrit 36.0 - 46.0 % 38.9 34.5(L) 36.4  Platelets 150.0 - 400.0 K/uL 212.0 232.0 289    CMP: CMP Latest Ref Rng & Units 09/04/2020 05/25/2020 05/19/2019  Glucose 70 - 99 mg/dL 93 96 104(H)  BUN 6 - 23 mg/dL 14 15 15   Creatinine 0.40 - 1.20 mg/dL 0.77 0.75 0.79  Sodium 135 - 145 mEq/L 139 140 139  Potassium 3.5 - 5.1 mEq/L 4.6 4.0 5.0  Chloride 96 - 112 mEq/L 99 97 101  CO2 19 - 32 mEq/L 33(H) 35(H) 29  Calcium 8.4 - 10.5 mg/dL 9.9 9.8 9.7  Total Protein 6.0 - 8.3 g/dL 7.0 6.7 6.7  Total Bilirubin 0.2 - 1.2 mg/dL 0.7 0.6 0.5  Alkaline Phos 39 - 117 U/L 65 79 -  AST 0 - 37 U/L 18 15 31   ALT  0 - 35 U/L 14 12 32(H)      Radiology Studies: No results found.     RCarmell Austria MD 03/25/2021, 9:51 AM  Cc: JSamuel Bouche NP

## 2021-03-25 NOTE — Patient Instructions (Signed)
If you are age 62 or older, your body mass index should be between 23-30. Your Body mass index is 36.31 kg/m. If this is out of the aforementioned range listed, please consider follow up with your Primary Care Provider.  If you are age 68 or younger, your body mass index should be between 19-25. Your Body mass index is 36.31 kg/m. If this is out of the aformentioned range listed, please consider follow up with your Primary Care Provider.   __________________________________________________________  The [facility] providers would like to encourage you to use [facility] to communicate with providers for non-urgent requests or questions.  Due to long hold times on the telephone, sending your provider a message by [facility] may be a faster and more efficient way to get a response.  Please allow 48 business hours for a response.  Please remember that this is for non-urgent requests.   Please go to the lab on the 2nd floor [address] before you leave the office today.   Switch from Humira to [place], this will need a prior authorization from your insurance company. We will be in contact with the outcome.  Continue Protonix 40 mg  Avoid NSAIDS.  It was a pleasure to see you today!  Jackquline Denmark, M.D.

## 2021-03-26 ENCOUNTER — Telehealth: Payer: Self-pay

## 2021-03-26 ENCOUNTER — Encounter: Payer: Self-pay | Admitting: Medical-Surgical

## 2021-03-26 NOTE — Telephone Encounter (Addendum)
Initiated Prior authorization JJY:PGATAO 2.4 MG/0.75ML SOAJ Via: Covermymeds OLDZ/WAC:[id number] Status: Pending as of 03/19/21 Reason:Called this request is still pending as of 03/28/21, this was previously submitted by another user pt's insurance has not reached a decision as of yet PA's can take up to 15 days we are currently on day 9. Notified Pt via: Mychart Answered questions Fish farm manager Prior Authorization [id number]

## 2021-03-27 ENCOUNTER — Other Ambulatory Visit: Payer: Self-pay | Admitting: Internal Medicine

## 2021-03-27 NOTE — Telephone Encounter (Signed)
Next Visit: not on file  Last Visit: 03/13/2021  Last Fill:03/01/2021  Dx: Nontraumatic complete tear of right rotator cuff  Current Dose per office note on 03/13/2021: not mentioned  Okay to refill Prednisone?  When should patient be scheduled for follow up?

## 2021-03-28 LAB — QUANTIFERON-TB GOLD PLUS
Mitogen-NIL: >10 IU/mL
NIL: 0.03 IU/mL
QuantiFERON-TB Gold Plus: NEGATIVE
TB1-NIL: 0 IU/mL
TB2-NIL: 0 IU/mL

## 2021-03-28 NOTE — Telephone Encounter (Signed)
Okay to refill. She is switching medicine for crohn's per GI plan we can follow up in 2-3 months to reassess her progress with this and plan steroid taper.

## 2021-03-29 ENCOUNTER — Encounter: Payer: Self-pay | Admitting: Medical-Surgical

## 2021-03-29 NOTE — Telephone Encounter (Signed)
Received Notice of Approval  Approved from 03/19/2021 - 03/19/2022  Placed in [place]

## 2021-04-01 NOTE — Progress Notes (Signed)
Thanks for the update

## 2021-04-03 ENCOUNTER — Other Ambulatory Visit: Payer: Self-pay

## 2021-04-04 ENCOUNTER — Encounter: Payer: Self-pay | Admitting: Gastroenterology

## 2021-04-10 ENCOUNTER — Ambulatory Visit (INDEPENDENT_AMBULATORY_CARE_PROVIDER_SITE_OTHER): Payer: 59

## 2021-04-10 ENCOUNTER — Other Ambulatory Visit: Payer: Self-pay

## 2021-04-10 DIAGNOSIS — Z1231 Encounter for screening mammogram for malignant neoplasm of breast: Secondary | ICD-10-CM | POA: Diagnosis not present

## 2021-04-11 DIAGNOSIS — Z1231 Encounter for screening mammogram for malignant neoplasm of breast: Secondary | ICD-10-CM

## 2021-04-12 ENCOUNTER — Other Ambulatory Visit: Payer: Self-pay | Admitting: Medical-Surgical

## 2021-04-12 DIAGNOSIS — R928 Other abnormal and inconclusive findings on diagnostic imaging of breast: Secondary | ICD-10-CM

## 2021-04-15 ENCOUNTER — Encounter: Payer: Self-pay | Admitting: Gastroenterology

## 2021-04-17 ENCOUNTER — Telehealth: Payer: Self-pay | Admitting: Pharmacy Technician

## 2021-04-17 ENCOUNTER — Other Ambulatory Visit: Payer: Self-pay | Admitting: Pharmacy Technician

## 2021-04-17 NOTE — Telephone Encounter (Signed)
Patient has been enrolled with [employer] co-pay card. Awaiting response.

## 2021-04-17 NOTE — Telephone Encounter (Signed)
Auth Submission: Pending ?Payer: [employer] ?Medication & CPT/J Code(s) submitted: Entyvio (Vedolizumab) [id number] ?Route of submission (phone, fax, portal): phone ?[phone] ?Auth type: Buy/Bill ?Units/visits requested: 8 visits ?Reference number:  ? ? ?Will update once we receive a response. ? ?  ?

## 2021-04-17 NOTE — Telephone Encounter (Signed)
Auth Submission: APPROVED ?Payer: [employer] ?Medication & CPT/J Code(s) submitted: Entyvio (Vedolizumab) [id number] ?Route of submission (phone, fax, portal): PHONE ?[phone] ?Auth type: Buy/Bill ?Units/visits requested: 9 VISITS ?Reference number: [id number] ?Approval from: 04/17/21 to 04/18/22  ?Patient will be scheduled as soon as possible.

## 2021-04-19 ENCOUNTER — Other Ambulatory Visit: Payer: Self-pay | Admitting: Medical-Surgical

## 2021-04-19 DIAGNOSIS — E6609 Other obesity due to excess calories: Secondary | ICD-10-CM

## 2021-04-22 NOTE — Telephone Encounter (Signed)
Patient has been approved for [facility] co-pay card: ?ID# [id number] ?GR# [id number] ?BIN# [id number] ?PCN# [id number] ?$20,000/YR

## 2021-04-23 ENCOUNTER — Ambulatory Visit
Admission: RE | Admit: 2021-04-23 | Discharge: 2021-04-23 | Disposition: A | Discharge status: Home / Self Care | Payer: 59 | Source: Ambulatory Visit | Attending: Medical-Surgical | Admitting: Medical-Surgical

## 2021-04-23 ENCOUNTER — Encounter: Payer: Self-pay | Admitting: Gastroenterology

## 2021-04-23 ENCOUNTER — Other Ambulatory Visit: Payer: Self-pay | Admitting: Medical-Surgical

## 2021-04-23 DIAGNOSIS — R928 Other abnormal and inconclusive findings on diagnostic imaging of breast: Secondary | ICD-10-CM

## 2021-04-23 DIAGNOSIS — N6310 Unspecified lump in the right breast, unspecified quadrant: Secondary | ICD-10-CM

## 2021-04-25 ENCOUNTER — Ambulatory Visit (INDEPENDENT_AMBULATORY_CARE_PROVIDER_SITE_OTHER): Payer: 59

## 2021-04-25 ENCOUNTER — Other Ambulatory Visit: Payer: Self-pay

## 2021-04-25 VITALS — BP 125/78 | HR 84 | Temp 99.1°F | Resp 16 | Ht 63.0 in | Wt 206.0 lb

## 2021-04-25 DIAGNOSIS — K50018 Crohn's disease of small intestine with other complication: Secondary | ICD-10-CM

## 2021-04-25 MED ORDER — ALBUTEROL SULFATE HFA 108 (90 BASE) MCG/ACT IN AERS: 2.0000 | INHALATION_SPRAY | Freq: Once | RESPIRATORY_TRACT | Status: DC | PRN

## 2021-04-25 MED ORDER — SODIUM CHLORIDE 0.9 % IV SOLN: Freq: Once | INTRAVENOUS | Status: DC | PRN

## 2021-04-25 MED ORDER — DIPHENHYDRAMINE HCL 50 MG/ML IJ SOLN: 50.0000 mg | Freq: Once | INTRAMUSCULAR | Status: DC | PRN

## 2021-04-25 MED ORDER — VEDOLIZUMAB 300 MG IV SOLR
300.0000 mg | Freq: Once | INTRAVENOUS | Status: AC
  Administered 2021-04-25: 09:00:00 300 mg via INTRAVENOUS
  Filled 2021-04-25: qty 5

## 2021-04-25 MED ORDER — METHYLPREDNISOLONE SODIUM SUCC 125 MG IJ SOLR: 125.0000 mg | Freq: Once | INTRAMUSCULAR | Status: DC | PRN

## 2021-04-25 MED ORDER — FAMOTIDINE IN NACL 20-0.9 MG/50ML-% IV SOLN: 20.0000 mg | Freq: Once | INTRAVENOUS | Status: DC | PRN

## 2021-04-25 MED ORDER — EPINEPHRINE 0.3 MG/0.3ML IJ SOAJ: 0.3000 mg | Freq: Once | INTRAMUSCULAR | Status: DC | PRN

## 2021-04-25 NOTE — Progress Notes (Addendum)
Diagnosis: Crohn's Disease ? ?Provider:  Marshell Garfinkel, MD ? ?Procedure: Infusion ? ?IV Type: Peripheral, IV Location: L Antecubital ? ?Entyvio (Vedolizumab), Dose: 300 mg ? ?Infusion Start Time: [phone] ? ?Infusion Stop Time: 2330 ? ?Post Infusion IV Care: Observation period completed ? ?Discharge: Condition: Good, Destination: Home . AVS provided to patient.  ? ?Performed by:  Paul Dykes, RN  ?  ?

## 2021-04-29 ENCOUNTER — Encounter: Payer: Self-pay | Admitting: Medical-Surgical

## 2021-04-29 ENCOUNTER — Ambulatory Visit (INDEPENDENT_AMBULATORY_CARE_PROVIDER_SITE_OTHER): Payer: 59

## 2021-04-29 ENCOUNTER — Ambulatory Visit (INDEPENDENT_AMBULATORY_CARE_PROVIDER_SITE_OTHER): Payer: 59 | Admitting: Medical-Surgical

## 2021-04-29 ENCOUNTER — Other Ambulatory Visit: Payer: Self-pay

## 2021-04-29 VITALS — BP 125/86 | HR 88 | Temp 99.0°F | Resp 20 | Ht 63.0 in | Wt 204.9 lb

## 2021-04-29 DIAGNOSIS — R059 Cough, unspecified: Secondary | ICD-10-CM

## 2021-04-29 DIAGNOSIS — R0689 Other abnormalities of breathing: Secondary | ICD-10-CM | POA: Diagnosis not present

## 2021-04-29 DIAGNOSIS — R062 Wheezing: Secondary | ICD-10-CM | POA: Diagnosis not present

## 2021-04-29 DIAGNOSIS — R509 Fever, unspecified: Secondary | ICD-10-CM | POA: Diagnosis not present

## 2021-04-29 DIAGNOSIS — J329 Chronic sinusitis, unspecified: Secondary | ICD-10-CM

## 2021-04-29 DIAGNOSIS — J40 Bronchitis, not specified as acute or chronic: Secondary | ICD-10-CM

## 2021-04-29 LAB — POCT INFLUENZA A/B
Influenza A, POC: NEGATIVE
Influenza B, POC: NEGATIVE

## 2021-04-29 MED ORDER — AMOXICILLIN-POT CLAVULANATE 875-125 MG PO TABS: 1.0000 | ORAL_TABLET | Freq: Two times a day (BID) | ORAL | 0 refills | Status: AC

## 2021-04-29 MED ORDER — PROMETHAZINE-DM 6.25-15 MG/5ML PO SYRP
5.0000 mL | ORAL_SOLUTION | Freq: Four times a day (QID) | ORAL | 0 refills | Status: DC | PRN
Start: 2021-04-29 — End: 2021-06-20

## 2021-04-29 MED ORDER — PREDNISONE 20 MG PO TABS: 20.0000 mg | ORAL_TABLET | Freq: Every day | ORAL | 0 refills | Status: DC

## 2021-04-29 NOTE — Progress Notes (Signed)
?  HPI with pertinent ROS:  ? ?CC: Cough ? ?HPI: ?Pleasant 61 year old female presenting today with reports of 10 days of upper respiratory symptoms including sinus congestion, sore throat, headache, and cough productive of clear to yellow sputum.  She has had a low-grade fever for the past week along with a decreased appetite.  Has had some nausea and vomiting but is not sure if this is related to her current illness or if its related to her recent Entyvio infusion.  Having difficulty sleeping at night due to the severity of her cough.  Feels short of breath but no chest pain. ? ?I reviewed the past medical history, family history, social history, surgical history, and allergies today and no changes were needed.  Please see the problem list section below in epic for further details. ? ? ?Physical exam:  ? ?General: Well Developed, well nourished, and in no acute distress.  ?Neuro: Alert and oriented x3.  ?HEENT: Normocephalic, atraumatic.  ?Skin: Warm and dry. ?Cardiac: Regular rate and rhythm, no murmurs rubs or gallops, no lower extremity edema.  ?Respiratory: Expiratory wheezes and coarse crackles to all lung fields.  Not using accessory muscles, speaking in full sentences. ? ?Impression and Recommendations:   ? ?1. Cough in adult ?X-ray today.  POCT influenza negative.  COVID test sent to lab. ?- Novel Coronavirus, NAA ([facility]) ?- POCT Influenza A/B ?- DG Chest 2 View; Future ? ?2. Sinobronchitis ?After 10 days of symptom, treating with Augmentin twice daily x10 days.  Sending in additional prednisone to increase to 30 mg total x5 days.  Also sending in Promethazine DM 4 times daily as needed for cough.  Advised to use this cautiously as it may cause sedation. ? ?Return if symptoms worsen or fail to improve. ?___________________________________________ ?Clearnce Sorrel, DNP, APRN, FNP-BC ?Primary Care and Sports Medicine ?[place] ?

## 2021-04-30 ENCOUNTER — Encounter: Payer: Self-pay | Admitting: Medical-Surgical

## 2021-04-30 DIAGNOSIS — B3731 Acute candidiasis of vulva and vagina: Secondary | ICD-10-CM

## 2021-04-30 MED ORDER — FLUCONAZOLE 150 MG PO TABS: 150.0000 mg | ORAL_TABLET | Freq: Once | ORAL | 0 refills | Status: AC

## 2021-05-01 LAB — NOVEL CORONAVIRUS, NAA: SARS-CoV-2, NAA: NOT DETECTED

## 2021-05-09 ENCOUNTER — Other Ambulatory Visit: Payer: Self-pay

## 2021-05-09 ENCOUNTER — Ambulatory Visit (INDEPENDENT_AMBULATORY_CARE_PROVIDER_SITE_OTHER): Payer: 59

## 2021-05-09 VITALS — BP 121/77 | HR 82 | Temp 99.1°F | Resp 16 | Ht 64.0 in | Wt 205.0 lb

## 2021-05-09 DIAGNOSIS — K50018 Crohn's disease of small intestine with other complication: Secondary | ICD-10-CM | POA: Diagnosis not present

## 2021-05-09 MED ORDER — VEDOLIZUMAB 300 MG IV SOLR
300.0000 mg | Freq: Once | INTRAVENOUS | Status: AC
  Administered 2021-05-09: 300 mg via INTRAVENOUS
  Filled 2021-05-09: qty 5

## 2021-05-09 NOTE — Progress Notes (Signed)
Diagnosis: Crohn's Disease ? ?Provider:  Marshell Garfinkel, MD ? ?Procedure: Infusion ? ?IV Type: Peripheral, IV Location: R Hand ? ?Entyvio (Vedolizumab), , Dose: 300 mg ? ?Infusion Start Time: [phone] ? ?Infusion Stop Time: 1661 ? ?Post Infusion IV Care: Peripheral IV Discontinued ? ?Discharge: Condition: Good, Destination: Home . AVS provided to patient.  ? ?Performed by:  Koren Shiver, RN  ?  ?

## 2021-05-27 ENCOUNTER — Ambulatory Visit: Payer: 59 | Admitting: Internal Medicine

## 2021-06-04 ENCOUNTER — Other Ambulatory Visit: Payer: Self-pay | Admitting: Gastroenterology

## 2021-06-04 DIAGNOSIS — K219 Gastro-esophageal reflux disease without esophagitis: Secondary | ICD-10-CM

## 2021-06-06 ENCOUNTER — Ambulatory Visit (INDEPENDENT_AMBULATORY_CARE_PROVIDER_SITE_OTHER): Payer: 59

## 2021-06-06 VITALS — BP 126/84 | HR 70 | Temp 98.2°F | Resp 18 | Ht 63.0 in | Wt 207.2 lb

## 2021-06-06 DIAGNOSIS — K50018 Crohn's disease of small intestine with other complication: Secondary | ICD-10-CM | POA: Diagnosis not present

## 2021-06-06 MED ORDER — VEDOLIZUMAB 300 MG IV SOLR
300.0000 mg | Freq: Once | INTRAVENOUS | Status: AC
  Administered 2021-06-06: 300 mg via INTRAVENOUS
  Filled 2021-06-06: qty 5

## 2021-06-06 NOTE — Progress Notes (Signed)
Diagnosis: Crohn's Disease ? ?Provider:  Marshell Garfinkel, MD ? ?Procedure: Infusion ? ?IV Type: Peripheral, IV Location: L Antecubital ? ?Entyvio (Vedolizumab), Dose: 300 mg ? ?Infusion Start Time: 6190 ? ?Infusion Stop Time: 1222 ? ?Post Infusion IV Care: Peripheral IV Discontinued ? ?Discharge: Condition: Good, Destination: Home . AVS provided to patient.  ? ?Performed by:  Paul Dykes, RN  ?  ?

## 2021-06-18 ENCOUNTER — Ambulatory Visit: Payer: 59 | Admitting: Physician Assistant

## 2021-06-20 ENCOUNTER — Ambulatory Visit (INDEPENDENT_AMBULATORY_CARE_PROVIDER_SITE_OTHER): Payer: 59 | Admitting: Medical-Surgical

## 2021-06-20 ENCOUNTER — Encounter: Payer: Self-pay | Admitting: Medical-Surgical

## 2021-06-20 ENCOUNTER — Encounter: Payer: Self-pay | Admitting: Gastroenterology

## 2021-06-20 VITALS — BP 120/85 | HR 98 | Resp 20 | Ht 63.0 in | Wt 200.3 lb

## 2021-06-20 DIAGNOSIS — J40 Bronchitis, not specified as acute or chronic: Secondary | ICD-10-CM

## 2021-06-20 DIAGNOSIS — J329 Chronic sinusitis, unspecified: Secondary | ICD-10-CM

## 2021-06-20 DIAGNOSIS — L918 Other hypertrophic disorders of the skin: Secondary | ICD-10-CM

## 2021-06-20 MED ORDER — CEFDINIR 300 MG PO CAPS: 300.0000 mg | ORAL_CAPSULE | Freq: Two times a day (BID) | ORAL | 0 refills | Status: DC

## 2021-06-20 MED ORDER — PROMETHAZINE-DM 6.25-15 MG/5ML PO SYRP
5.0000 mL | ORAL_SOLUTION | Freq: Four times a day (QID) | ORAL | 0 refills | Status: DC | PRN
Start: 2021-06-20 — End: 2021-07-12

## 2021-06-20 MED ORDER — FLUCONAZOLE 150 MG PO TABS: 150.0000 mg | ORAL_TABLET | Freq: Once | ORAL | 0 refills | Status: AC

## 2021-06-20 NOTE — Telephone Encounter (Signed)
Pt stated that she feels like the Entyvio  infusions are making her sick and she is requesting an alternative medication. Pt also states that she continues to have abdominal pain along with Diarrhea: Pt scheduled for an office visit with Dr. Lyndel Safe on 07/12/2021 at 8:30 AM: Pt made aware ?Pt verbalized understanding with all questions answered.  ? ?

## 2021-06-20 NOTE — Progress Notes (Signed)
?  HPI with pertinent ROS:  ? ?CC: sinus congestion, ear ache, cough ? ?HPI: ?Pleasant 61 year old female presenting today for evaluation of sore throat, cough, earache, and sinus congestion that has been going on for about 1 week.  Approximately 1 month ago, she had pinkeye an ear infection.  [facility] where they provided her with prednisone, Augmentin, and Promethazine DM.  Today she reports that she is feeling pretty bad and is having difficulty getting some rest.  She is dizzy and wonders if she has an ear infection.  She has tried NyQuil but its not helping. ? ?Has a small spot above her eye that is very bothersome and she would like to have this frozen today. ? ?I reviewed the past medical history, family history, social history, surgical history, and allergies today and no changes were needed.  Please see the problem list section below in epic for further details. ? ?Physical exam:  ? ?General: Well Developed, well nourished, and in no acute distress.  ?Neuro: Alert and oriented x3.  ?HEENT: Normocephalic, atraumatic, pupils equal round reactive to light, neck supple, no masses, no lymphadenopathy, thyroid nonpalpable.  ?Skin: Warm and dry. ?Cardiac: Regular rate and rhythm, no murmurs rubs or gallops, no lower extremity edema.  ?Respiratory: Clear to auscultation bilaterally. Not using accessory muscles, speaking in full sentences. ? ?Impression and Recommendations:   ? ?1. Sinobronchitis ?Cefdinir 324m BID x 7 days. Holding off on further steroids for now. Refilling Promethazine-DM. Use Delsym during the day. Start Floase BID x 3-5 days then reduce to once daily. Diflucan for VVC prophylaxis. Recommend contacting GI regarding Entyvio as she has been recurrently ill since starting this medication.  ? ?2. Skin tag ?Cryotherapy template ?Procedure: Cryodestruction of: skin tag of right eyelid ?Consent obtained and verified. ?Time-out conducted. ?Noted no overlying erythema,  induration, or other signs of local infection. ?Completed without difficulty using Cryo-Gun. ?Advised to call if fevers/chills, erythema, induration, drainage, or persistent bleeding. ? ?Return if symptoms worsen or fail to improve. ?___________________________________________ ?JClearnce Sorrel DNP, APRN, FNP-BC ?Primary Care and Sports Medicine ?[facility]?

## 2021-06-21 ENCOUNTER — Other Ambulatory Visit: Payer: Self-pay | Admitting: Medical-Surgical

## 2021-06-21 DIAGNOSIS — E6609 Other obesity due to excess calories: Secondary | ICD-10-CM

## 2021-07-12 ENCOUNTER — Encounter: Payer: Self-pay | Admitting: Gastroenterology

## 2021-07-12 ENCOUNTER — Ambulatory Visit (INDEPENDENT_AMBULATORY_CARE_PROVIDER_SITE_OTHER): Payer: 59 | Admitting: Gastroenterology

## 2021-07-12 ENCOUNTER — Other Ambulatory Visit (INDEPENDENT_AMBULATORY_CARE_PROVIDER_SITE_OTHER): Payer: 59

## 2021-07-12 VITALS — BP 138/86 | HR 60 | Ht 63.0 in | Wt 199.0 lb

## 2021-07-12 DIAGNOSIS — K50019 Crohn's disease of small intestine with unspecified complications: Secondary | ICD-10-CM | POA: Diagnosis not present

## 2021-07-12 DIAGNOSIS — K219 Gastro-esophageal reflux disease without esophagitis: Secondary | ICD-10-CM

## 2021-07-12 DIAGNOSIS — K449 Diaphragmatic hernia without obstruction or gangrene: Secondary | ICD-10-CM

## 2021-07-12 LAB — CBC WITH DIFFERENTIAL/PLATELET
Basophils Absolute: 0 10*3/uL (ref 0.0–0.1)
Basophils Relative: 0.5 % (ref 0.0–3.0)
Eosinophils Absolute: 0.1 10*3/uL (ref 0.0–0.7)
Eosinophils Relative: 1.6 % (ref 0.0–5.0)
HCT: 34.3 % — ABNORMAL LOW (ref 36.0–46.0)
Hemoglobin: 11.3 g/dL — ABNORMAL LOW (ref 12.0–15.0)
Lymphocytes Relative: 25.4 % (ref 12.0–46.0)
Lymphs Abs: 1.1 10*3/uL (ref 0.7–4.0)
MCHC: 33.1 g/dL (ref 30.0–36.0)
MCV: 77.7 fl — ABNORMAL LOW (ref 78.0–100.0)
Monocytes Absolute: 0.3 10*3/uL (ref 0.1–1.0)
Monocytes Relative: 7.5 % (ref 3.0–12.0)
Neutro Abs: 2.9 10*3/uL (ref 1.4–7.7)
Neutrophils Relative %: 65 % (ref 43.0–77.0)
Platelets: 241 10*3/uL (ref 150.0–400.0)
RBC: 4.41 Mil/uL (ref 3.87–5.11)
RDW: 16.3 % — ABNORMAL HIGH (ref 11.5–15.5)
WBC: 4.4 10*3/uL (ref 4.0–10.5)

## 2021-07-12 LAB — COMPREHENSIVE METABOLIC PANEL
ALT: 14 U/L (ref 0–35)
AST: 19 U/L (ref 0–37)
Albumin: 4.3 g/dL (ref 3.5–5.2)
Alkaline Phosphatase: 59 U/L (ref 39–117)
BUN: 10 mg/dL (ref 6–23)
CO2: 28 mEq/L (ref 19–32)
Calcium: 9.6 mg/dL (ref 8.4–10.5)
Chloride: 103 mEq/L (ref 96–112)
Creatinine, Ser: 0.77 mg/dL (ref 0.40–1.20)
GFR: 83.18 mL/min (ref >60.00)
Glucose, Bld: 87 mg/dL (ref 70–99)
Potassium: 3.5 mEq/L (ref 3.5–5.1)
Sodium: 141 mEq/L (ref 135–145)
Total Bilirubin: 0.6 mg/dL (ref 0.2–1.2)
Total Protein: 7.1 g/dL (ref 6.0–8.3)

## 2021-07-12 LAB — C-REACTIVE PROTEIN: CRP: <1 mg/dL (ref 0.5–20.0)

## 2021-07-12 MED ORDER — DICYCLOMINE HCL 10 MG PO CAPS: 10.0000 mg | ORAL_CAPSULE | Freq: Two times a day (BID) | ORAL | 2 refills | Status: DC

## 2021-07-12 NOTE — Patient Instructions (Addendum)
If you are age 62 or older, your body mass index should be between 23-30. Your Body mass index is 35.25 kg/m. If this is out of the aforementioned range listed, please consider follow up with your Primary Care Provider.  If you are age 71 or younger, your body mass index should be between 19-25. Your Body mass index is 35.25 kg/m. If this is out of the aformentioned range listed, please consider follow up with your Primary Care Provider.   ________________________________________________________  The [place] providers would like to encourage you to use [facility] to communicate with providers for non-urgent requests or questions.  Due to long hold times on the telephone, sending your provider a message by [facility] may be a faster and more efficient way to get a response.  Please allow 48 business hours for a response.  Please remember that this is for non-urgent requests.  _______________________________________________________  Your provider has requested that you go to the basement level for lab work before leaving today. Press "B" on the elevator. The lab is located at the first door on the left as you exit the elevator.  Continue Enityo and please call us after next infusion with an update to the nurse.  We have sent the following medications to your pharmacy for you to pick up at your convenience: Bentyl  Continue Protonix and avoid NSAID's   Thank you,  Dr. Jackquline Denmark

## 2021-07-12 NOTE — Progress Notes (Signed)
Chief Complaint: FU  Referring Provider:  Samuel Bouche, NP      ASSESSMENT AND PLAN;    #1. Ileal Crohn's Disease with stricture. Dx 03/2020 on colon. Assoc IDA.  Started Humira 07/2020. Developed Abs. Entiyo started 03/2021, has been having flu like symptoms #2. GERD with large HH   Plan: - Check CBC, CMP, CRP - Stool for GI path/calprotectin - Continue Enityo 300 mg IV every 8 weekly.  (Next dose - mid June).  If she still has flulike symptoms after the next dose, may switch to Stelara - Trial of Bentyl 70m po BID 1/2hr before meals #60), 2 RF - [facility] - Continue Protonix 40 mg p.o. QD - Avoid NSAIDs.    HPI:    Megan VANCAMPis a 61y.o. female   FU   Having recurrent sinusitis/flulike symptoms ever since she started EFiji  Next dose is due mid June.  He wants to try 1 more dose before switching to other meds.  We did talk about Stelara today.  Continues to have baseline diarrhea - 2-3/day every 2-3 days. After eating.  No nocturnal symptoms. Minimal lower abdominal cramps. No melena or hematochezia.  Denies having any upper GI symptoms.  She got Shingrix vaccine from PCP previously.   No joint pains.  No history suggestive of inflammatory arthritis.  No skin rash.  No weight loss.  Past GI work-up:  Colonoscopy 04/10/2020 - Two 2 to 4 mm polyps in the proximal ascending colon and in the mid ascending colon, removed with a cold biopsy forceps. Resected and retrieved. - Moderate predominantly sigmoid diverticulosis. - Terminal ileitis with terminal ileal stricture-highly suggestive of Crohn's disease. - The examination was otherwise normal on direct and retroflexion views.  EGD 05/2020 - Large hiatal hernia. - Gastritis. Biopsied. - A single gastric polyp. Biopsied.  CTE 05/2020: 1.  CT Enterography of the abdomen and pelvis shows 2 areas of involvement of the ileum.  2.  Distal segment of ileum shows  mucosal enhancement consistent with active disease. Incomplete distention of this area may be related to spasm. Structures not excluded. No evidence of obstruction.  3.  Mucosal enhancement region of the terminal ileum consistent with active disease. Incomplete distention of this area may be related to spasm. Structures not excluded. No evidence of obstruction.  4.  Possible active disease proximal portion of the appendix. No periappendiceal inflammation or enlargement of the appendix.  5.  No evidence of fistula formation or abscess.  6.  Large sliding hiatal hernia.  Past Medical History:  Diagnosis Date   Acute respiratory failure (HCC)    Arthritis    [place]    Fibromyalgia    GERD (gastroesophageal reflux disease)    Hypertension    Menopausal vasomotor syndrome    Obesity    Pre-diabetes    RSV (respiratory syncytial virus pneumonia)     Past Surgical History:  Procedure Laterality Date   ANKLE ARTHROSCOPY Left 1985   COLONOSCOPY  2011   IN [place]=diverticulosis   DILATION AND CURETTAGE OF UTERUS N/A 08/14/2017   Procedure: DILATATION AND CURETTAGE;  Surgeon: DEmily Filbert MD;  [place]  Service: Gynecology;  Laterality: N/A;   REPLACEMENT TOTAL KNEE Right 2021    Family History  Problem Relation Age of Onset   Heart disease Mother    Hypertension Mother    Heart disease Father    Colon polyps  Father    Breast cancer Maternal Grandmother    Colon cancer Neg Hx    Esophageal cancer Neg Hx    Rectal cancer Neg Hx    Stomach cancer Neg Hx     Social History   Tobacco Use   Smoking status: Never   Smokeless tobacco: Never  Vaping Use   Vaping Use: Never used  Substance Use Topics   Alcohol use: Not Currently   Drug use: Never    Current Outpatient Medications  Medication Sig Dispense Refill   hydrochlorothiazide (HYDRODIURIL) 25 MG tablet Take 1 tablet (25 mg total) by mouth daily. (Patient taking differently:  Take 12.5 tablets by mouth daily.) 90 tablet 1   pantoprazole (PROTONIX) 40 MG tablet TAKE 1 TABLET(40 MG) BY MOUTH DAILY 90 tablet 4   sertraline (ZOLOFT) 50 MG tablet TAKE 1 TABLET(50 MG) BY MOUTH DAILY 90 tablet 1   Vedolizumab (ENTYVIO IV) Inject into the vein.     WEGOVY 2.4 MG/0.75ML SOAJ INJECT 2.4 MG SUBCUTANEOUSLY ONCE A WEEK 9 mL 1   No current facility-administered medications for this visit.    No Known Allergies  Review of Systems:  neg     Physical Exam:    BP 138/86   Pulse 60   Ht 5' 3"  (1.6 m)   Wt 199 lb (90.3 kg)   SpO2 94%   BMI 35.25 kg/m  Wt Readings from Last 3 Encounters:  07/12/21 199 lb (90.3 kg)  06/20/21 200 lb 4.8 oz (90.9 kg)  06/06/21 207 lb 3.2 oz (94 kg)   Constitutional:  Well-developed, in no acute distress. Psychiatric: Normal mood and affect. Behavior is normal. HEENT: Pupils normal.  Conjunctivae are normal. No scleral icterus. Abdominal: Soft, nondistended. Nontender. Bowel sounds active throughout. There are no masses palpable. No hepatomegaly. Rectal: Deferred Neurological: Alert and oriented to person place and time. Skin: Skin is warm and dry. No rashes noted.  Data Reviewed: I have personally reviewed following labs and imaging studies  CBC:    Latest Ref Rng & Units 03/25/2021   10:26 AM 09/04/2020    9:01 AM 05/25/2020    9:36 AM  CBC  WBC 4.0 - 10.5 K/uL 6.9   5.5   4.5    Hemoglobin 12.0 - 15.0 g/dL 11.5   12.8   11.3    Hematocrit 36.0 - 46.0 % 36.8   38.9   34.5    Platelets 150.0 - 400.0 K/uL 257.0   212.0   232.0      CMP:    Latest Ref Rng & Units 03/25/2021   10:26 AM 09/04/2020    9:01 AM 05/25/2020    9:36 AM  CMP  Glucose 70 - 99 mg/dL 85   93   96    BUN 6 - 23 mg/dL 12   14   15     Creatinine 0.40 - 1.20 mg/dL 0.75   0.77   0.75    Sodium 135 - 145 mEq/L 141   139   140    Potassium 3.5 - 5.1 mEq/L 4.2   4.6   4.0    Chloride 96 - 112 mEq/L 102   99   97    CO2 19 - 32 mEq/L 35   33   35    Calcium 8.4  - 10.5 mg/dL 9.5   9.9   9.8    Total Protein 6.0 - 8.3 g/dL 6.6   7.0   6.7  Total Bilirubin 0.2 - 1.2 mg/dL 0.5   0.7   0.6    Alkaline Phos 39 - 117 U/L 45   65   79    AST 0 - 37 U/L 13   18   15     ALT 0 - 35 U/L 11   14   12         Radiology Studies: No results found.     Carmell Austria, MD 07/12/2021, 8:41 AM  Cc: Samuel Bouche, NP

## 2021-07-16 ENCOUNTER — Telehealth: Payer: Self-pay | Admitting: Gastroenterology

## 2021-07-16 ENCOUNTER — Telehealth: Payer: Self-pay

## 2021-07-16 LAB — GI PROFILE, STOOL, PCR

## 2021-07-16 NOTE — Telephone Encounter (Signed)
Pt was made aware of recent results. Documented under result notes Pt verbalized understanding with all questions answered.

## 2021-07-16 NOTE — Telephone Encounter (Signed)
Received call from [place] at [facility] and was notified that pt is positive for Campylobacter:

## 2021-07-16 NOTE — Telephone Encounter (Signed)
Patient returned your call, please advise. 

## 2021-07-18 NOTE — Telephone Encounter (Signed)
Lets treat her with azithromycin 500 mg p.o. daily x 3 days RG

## 2021-07-19 ENCOUNTER — Other Ambulatory Visit: Payer: Self-pay

## 2021-07-19 MED ORDER — FLUCONAZOLE 150 MG PO TABS: ORAL_TABLET | ORAL | 0 refills | Status: DC

## 2021-07-19 MED ORDER — AZITHROMYCIN 500 MG PO TABS: 500.0000 mg | ORAL_TABLET | Freq: Every day | ORAL | 0 refills | Status: AC

## 2021-07-19 NOTE — Telephone Encounter (Signed)
Patient notified of results and recommendation. She will also receive, per Dr Lyndel Safe, Diflucan 150 mg to take once antibiotics are completed if she develops vaginal yeast infection. Patient expresses understanding and agreement to this plan of care.

## 2021-07-25 LAB — CALPROTECTIN, FECAL: Calprotectin, Fecal: 175 ug/g — ABNORMAL HIGH (ref 0–120)

## 2021-08-01 ENCOUNTER — Ambulatory Visit (INDEPENDENT_AMBULATORY_CARE_PROVIDER_SITE_OTHER): Payer: 59

## 2021-08-01 VITALS — BP 107/73 | HR 71 | Temp 98.6°F | Resp 16 | Ht 63.0 in | Wt 189.0 lb

## 2021-08-01 DIAGNOSIS — K50018 Crohn's disease of small intestine with other complication: Secondary | ICD-10-CM | POA: Diagnosis not present

## 2021-08-01 MED ORDER — VEDOLIZUMAB 300 MG IV SOLR
300.0000 mg | Freq: Once | INTRAVENOUS | Status: AC
  Administered 2021-08-01: 300 mg via INTRAVENOUS
  Filled 2021-08-01: qty 5

## 2021-08-01 NOTE — Progress Notes (Signed)
Diagnosis: Crohn's Disease  Provider:  Marshell Garfinkel, MD  Procedure: Infusion  IV Type: Peripheral, IV Location: L Antecubital  Entyvio (Vedolizumab), Dose: 300 mg  Infusion Start Time: 7011  Infusion Stop Time: 1033  Post Infusion IV Care: Peripheral IV Discontinued  Discharge: Condition: Good, Destination: Home . AVS provided to patient.   Performed by:  Koren Shiver, RN

## 2021-08-12 ENCOUNTER — Encounter: Payer: Self-pay | Admitting: Medical-Surgical

## 2021-08-12 ENCOUNTER — Ambulatory Visit (INDEPENDENT_AMBULATORY_CARE_PROVIDER_SITE_OTHER): Payer: 59 | Admitting: Medical-Surgical

## 2021-08-12 VITALS — BP 111/78 | HR 91 | Resp 20 | Ht 63.0 in | Wt 193.0 lb

## 2021-08-12 DIAGNOSIS — Z23 Encounter for immunization: Secondary | ICD-10-CM | POA: Diagnosis not present

## 2021-08-12 DIAGNOSIS — R7303 Prediabetes: Secondary | ICD-10-CM | POA: Diagnosis not present

## 2021-08-12 DIAGNOSIS — E785 Hyperlipidemia, unspecified: Secondary | ICD-10-CM

## 2021-08-12 DIAGNOSIS — F325 Major depressive disorder, single episode, in full remission: Secondary | ICD-10-CM

## 2021-08-12 DIAGNOSIS — K219 Gastro-esophageal reflux disease without esophagitis: Secondary | ICD-10-CM

## 2021-08-12 DIAGNOSIS — Z7689 Persons encountering health services in other specified circumstances: Secondary | ICD-10-CM

## 2021-08-12 DIAGNOSIS — F419 Anxiety disorder, unspecified: Secondary | ICD-10-CM | POA: Diagnosis not present

## 2021-08-12 DIAGNOSIS — F32A Depression, unspecified: Secondary | ICD-10-CM

## 2021-08-12 DIAGNOSIS — I10 Essential (primary) hypertension: Secondary | ICD-10-CM

## 2021-08-12 LAB — POCT GLYCOSYLATED HEMOGLOBIN (HGB A1C): Hemoglobin A1C: 5.3 % (ref 4.0–5.6)

## 2021-08-12 MED ORDER — SERTRALINE HCL 50 MG PO TABS: ORAL_TABLET | ORAL | 1 refills | Status: DC

## 2021-08-13 ENCOUNTER — Encounter: Payer: Self-pay | Admitting: Medical-Surgical

## 2021-08-13 DIAGNOSIS — E785 Hyperlipidemia, unspecified: Secondary | ICD-10-CM

## 2021-08-13 LAB — LIPID PANEL
Cholesterol: 232 mg/dL — ABNORMAL HIGH (ref <200)
HDL: 78 mg/dL (ref >=50)
LDL Cholesterol (Calc): 121 mg/dL (calc) — ABNORMAL HIGH
Non-HDL Cholesterol (Calc): 154 mg/dL (calc) — ABNORMAL HIGH (ref <130)
Total CHOL/HDL Ratio: 3 (calc) (ref <5.0)
Triglycerides: 214 mg/dL — ABNORMAL HIGH (ref <150)

## 2021-08-13 MED ORDER — ATORVASTATIN CALCIUM 10 MG PO TABS: 10.0000 mg | ORAL_TABLET | Freq: Every day | ORAL | 3 refills | Status: DC

## 2021-09-26 ENCOUNTER — Ambulatory Visit (INDEPENDENT_AMBULATORY_CARE_PROVIDER_SITE_OTHER): Payer: 59

## 2021-09-26 VITALS — BP 123/80 | HR 75 | Temp 98.4°F | Resp 18 | Ht 63.0 in | Wt 191.6 lb

## 2021-09-26 DIAGNOSIS — K50018 Crohn's disease of small intestine with other complication: Secondary | ICD-10-CM

## 2021-09-26 MED ORDER — VEDOLIZUMAB 300 MG IV SOLR
300.0000 mg | Freq: Once | INTRAVENOUS | Status: AC
  Administered 2021-09-26: 300 mg via INTRAVENOUS
  Filled 2021-09-26: qty 5

## 2021-09-26 NOTE — Progress Notes (Signed)
Diagnosis: Crohn's Disease  Provider:  Marshell Garfinkel, MD  Procedure: Infusion  IV Type: Peripheral, IV Location: L Forearm  Entyvio (Vedolizumab), Dose: 300 mg  Infusion Start Time: 9806  Infusion Stop Time: 9996  Post Infusion IV Care: Peripheral IV Discontinued  Discharge: Condition: Good, Destination: Home . AVS provided to patient.   Performed by:  Adelina Mings, LPN

## 2021-10-24 ENCOUNTER — Other Ambulatory Visit: Payer: Self-pay | Admitting: Medical-Surgical

## 2021-10-24 DIAGNOSIS — F32A Depression, unspecified: Secondary | ICD-10-CM

## 2021-10-24 DIAGNOSIS — F325 Major depressive disorder, single episode, in full remission: Secondary | ICD-10-CM

## 2021-10-25 ENCOUNTER — Ambulatory Visit
Admission: RE | Admit: 2021-10-25 | Discharge: 2021-10-25 | Disposition: A | Discharge status: Home / Self Care | Payer: 59 | Source: Ambulatory Visit | Attending: Medical-Surgical | Admitting: Medical-Surgical

## 2021-10-25 DIAGNOSIS — N6310 Unspecified lump in the right breast, unspecified quadrant: Secondary | ICD-10-CM

## 2021-10-25 DIAGNOSIS — R928 Other abnormal and inconclusive findings on diagnostic imaging of breast: Secondary | ICD-10-CM

## 2021-11-21 ENCOUNTER — Ambulatory Visit (INDEPENDENT_AMBULATORY_CARE_PROVIDER_SITE_OTHER): Payer: 59

## 2021-11-21 VITALS — BP 142/87 | HR 83 | Temp 98.2°F | Resp 18 | Ht 63.0 in | Wt 189.4 lb

## 2021-11-21 DIAGNOSIS — K50018 Crohn's disease of small intestine with other complication: Secondary | ICD-10-CM

## 2021-11-21 MED ORDER — VEDOLIZUMAB 300 MG IV SOLR
300.0000 mg | Freq: Once | INTRAVENOUS | Status: AC
  Administered 2021-11-21: 300 mg via INTRAVENOUS
  Filled 2021-11-21: qty 5

## 2021-11-21 NOTE — Progress Notes (Signed)
Diagnosis: Crohn's Disease  Provider:  Marshell Garfinkel MD  Procedure: Infusion  IV Type: Peripheral, IV Location: L Forearm  Entyvio (Vedolizumab), Dose: 300 mg  Infusion Start Time: 0912  Infusion Stop Time: 6440  Post Infusion IV Care: Peripheral IV Discontinued  Discharge: Condition: Good, Destination: Home . AVS provided to patient.   Performed by:  Arnoldo Morale, RN

## 2021-12-06 ENCOUNTER — Other Ambulatory Visit: Payer: Self-pay | Admitting: Medical-Surgical

## 2021-12-06 DIAGNOSIS — E6609 Other obesity due to excess calories: Secondary | ICD-10-CM

## 2021-12-13 ENCOUNTER — Other Ambulatory Visit: Payer: Self-pay | Admitting: Medical-Surgical

## 2021-12-13 DIAGNOSIS — E6609 Other obesity due to excess calories: Secondary | ICD-10-CM

## 2022-01-16 ENCOUNTER — Ambulatory Visit (INDEPENDENT_AMBULATORY_CARE_PROVIDER_SITE_OTHER): Payer: 59

## 2022-01-16 VITALS — BP 127/78 | HR 77 | Temp 98.3°F | Resp 18 | Ht 63.0 in | Wt 186.4 lb

## 2022-01-16 DIAGNOSIS — K50018 Crohn's disease of small intestine with other complication: Secondary | ICD-10-CM

## 2022-01-16 MED ORDER — VEDOLIZUMAB 300 MG IV SOLR
300.0000 mg | Freq: Once | INTRAVENOUS | Status: AC
  Administered 2022-01-16: 300 mg via INTRAVENOUS
  Filled 2022-01-16: qty 5

## 2022-01-16 NOTE — Progress Notes (Signed)
Diagnosis: Crohn's Disease  Provider:  Marshell Garfinkel MD  Procedure: Infusion  IV Type: Peripheral, IV Location: L Forearm  Entyvio (Vedolizumab), Dose: 300 mg  Infusion Start Time: 0923  Infusion Stop Time: 0958  Post Infusion IV Care: Peripheral IV Discontinued  Discharge: Condition: Good, Destination: Home . AVS provided to patient.   Performed by:  Arnoldo Morale, RN

## 2022-01-17 ENCOUNTER — Other Ambulatory Visit: Payer: Self-pay

## 2022-02-11 ENCOUNTER — Ambulatory Visit: Payer: 59 | Admitting: Medical-Surgical

## 2022-02-11 DIAGNOSIS — Z91199 Patient's noncompliance with other medical treatment and regimen due to unspecified reason: Secondary | ICD-10-CM

## 2022-02-11 NOTE — Progress Notes (Unsigned)
   Established Patient Office Visit  Subjective   Patient ID: Megan [name], female   DOB: [birth year] Age: 61 y.o. MRN: [id number]   No chief complaint on file.   HPI Pleasant 61 year old female presenting today for the following:  Hypertension:  Prediabetes:  Obesity:   Objective:    There were no vitals filed for this visit.  Physical Exam   No results found for this or any previous visit (from the past 24 hour(s)).   {Labs (Optional):23779}  The 10-year ASCVD risk score (Arnett DK, et al., 2019) is: 4.9%   Values used to calculate the score:     Age: 55 years     Sex: Female     Is Non-Hispanic African American: No     Diabetic: No     Tobacco smoker: No     Systolic Blood Pressure: 127 mmHg     Is BP treated: Yes     HDL Cholesterol: 78 mg/dL     Total Cholesterol: 232 mg/dL   Assessment & Plan:   No problem-specific Assessment & Plan notes found for this encounter.   No follow-ups on file.  ___________________________________________ Thayer Ohm, DNP, APRN, FNP-BC Primary Care and Sports Medicine [facility]

## 2022-02-13 ENCOUNTER — Encounter: Payer: Self-pay | Admitting: Medical-Surgical

## 2022-02-13 NOTE — Addendum Note (Signed)
Addended bySamuel Bouche on: 02/13/2022 12:22 PM   Modules accepted: Level of Service

## 2022-03-07 ENCOUNTER — Other Ambulatory Visit: Payer: Self-pay | Admitting: Medical-Surgical

## 2022-03-07 DIAGNOSIS — E6609 Other obesity due to excess calories: Secondary | ICD-10-CM

## 2022-03-13 ENCOUNTER — Encounter: Payer: Self-pay | Admitting: Medical-Surgical

## 2022-03-13 ENCOUNTER — Ambulatory Visit (INDEPENDENT_AMBULATORY_CARE_PROVIDER_SITE_OTHER): Payer: 59 | Admitting: *Deleted

## 2022-03-13 VITALS — BP 114/80 | HR 75 | Temp 98.1°F | Resp 18 | Ht 64.0 in | Wt 187.4 lb

## 2022-03-13 DIAGNOSIS — K50018 Crohn's disease of small intestine with other complication: Secondary | ICD-10-CM | POA: Diagnosis not present

## 2022-03-13 DIAGNOSIS — I10 Essential (primary) hypertension: Secondary | ICD-10-CM

## 2022-03-13 MED ORDER — VEDOLIZUMAB 300 MG IV SOLR
300.0000 mg | Freq: Once | INTRAVENOUS | Status: AC
  Administered 2022-03-13: 300 mg via INTRAVENOUS
  Filled 2022-03-13: qty 5

## 2022-03-13 NOTE — Progress Notes (Signed)
Diagnosis: Crohn's Disease  Provider:  Marshell Garfinkel MD  Procedure: Infusion  IV Type: Peripheral, IV Location: L Forearm  Entyvio (Vedolizumab), Dose: 300 mg  Infusion Start Time: 9150 am  Infusion Stop Time: 1010 am  Post Infusion IV Care: Observation period completed and Peripheral IV Discontinued  Discharge: Condition: Good, Destination: Home . AVS provided to patient.   Performed by:  Oren Beckmann, RN

## 2022-03-14 ENCOUNTER — Telehealth: Payer: Self-pay

## 2022-03-14 MED ORDER — HYDROCHLOROTHIAZIDE 25 MG PO TABS: 25.0000 mg | ORAL_TABLET | Freq: Every day | ORAL | 0 refills | Status: DC

## 2022-03-14 NOTE — Telephone Encounter (Addendum)
Initiated Prior authorization [id number] 2.4MG/0.75ML auto-injectors Via: [email or web address] Case/Key:[id number] Status: Denied as of 03/14/22 Reason: Failure to show the pt has lost more then 5% of their baseline weight, chart notes received did not indicate the pt has had recent visit since 08/12/21,rx benefits was unable to authorize coverage at this time. An appeal is available. Notified Pt via: Mychart   Drug/Service Name: WEGOVY 2.4 MG/0.75 ML PEN Physician/Nurse: Samuel Bouche EOC ID: [id number] Status: Denied Date Requested: 04/01/2022 09:50:31 Date Closed: 04/07/2022 16:18:02 Dispensing Location: Pharmacy   Est.Time of Completion: N/A

## 2022-03-20 NOTE — Telephone Encounter (Signed)
Patient is due for follow up. Unfortunately, she had COVID when she was supposed to come in for this right after Christmas. Please contact her to facilitate scheduling for follow up on mood/weight/HTN. Ok to work her in but preferably would like to see her in the next couple of weeks. Needs in person visit.   ___________________________________________ Clearnce Sorrel, DNP, APRN, FNP-BC Primary Care and [place]

## 2022-03-21 ENCOUNTER — Encounter: Payer: Self-pay | Admitting: Medical-Surgical

## 2022-03-24 NOTE — Telephone Encounter (Signed)
Pt Scheduled 04/10/2022 @ 9:00 tvt

## 2022-03-26 ENCOUNTER — Telehealth: Payer: Self-pay

## 2022-03-26 NOTE — Telephone Encounter (Signed)
Patient called stating that she sent a message 03/21/2022 at 10:42 AM about [facility]. She stated that someone called her about weight loss and she wanted to know is she not going to get this medication?

## 2022-03-27 ENCOUNTER — Other Ambulatory Visit: Payer: Self-pay | Admitting: Pharmacy Technician

## 2022-03-28 NOTE — Telephone Encounter (Signed)
Spoke with patient. She is aware that we had to re-file the prior authorization with her new insurance and will contact her once we receive a determination.

## 2022-04-04 ENCOUNTER — Telehealth: Payer: Self-pay | Admitting: Pharmacy Technician

## 2022-04-04 NOTE — Telephone Encounter (Addendum)
PA RENEWAL - APPROVED  Auth Submission: APPROVED Payer: [employer] Medication & CPT/J Code(s) submitted: Entyvio (Vedolizumab) [id number] Route of submission (phone, fax, portal):  Phone [phone] Fax [phone] Auth type: Buy/Bill Units/visits requested: '300MG'$  Q8WKS Reference number: [id number] Approval from:  04/14/22 - 04/13/23

## 2022-04-10 ENCOUNTER — Ambulatory Visit (INDEPENDENT_AMBULATORY_CARE_PROVIDER_SITE_OTHER): Payer: 59

## 2022-04-10 ENCOUNTER — Ambulatory Visit (INDEPENDENT_AMBULATORY_CARE_PROVIDER_SITE_OTHER): Payer: 59 | Admitting: Medical-Surgical

## 2022-04-10 ENCOUNTER — Telehealth: Payer: Self-pay

## 2022-04-10 ENCOUNTER — Encounter: Payer: Self-pay | Admitting: Medical-Surgical

## 2022-04-10 VITALS — BP 136/84 | HR 81 | Resp 18 | Ht 64.0 in | Wt 193.1 lb

## 2022-04-10 DIAGNOSIS — F325 Major depressive disorder, single episode, in full remission: Secondary | ICD-10-CM | POA: Diagnosis not present

## 2022-04-10 DIAGNOSIS — G8929 Other chronic pain: Secondary | ICD-10-CM

## 2022-04-10 DIAGNOSIS — M25531 Pain in right wrist: Secondary | ICD-10-CM

## 2022-04-10 DIAGNOSIS — F419 Anxiety disorder, unspecified: Secondary | ICD-10-CM

## 2022-04-10 DIAGNOSIS — E785 Hyperlipidemia, unspecified: Secondary | ICD-10-CM

## 2022-04-10 DIAGNOSIS — I10 Essential (primary) hypertension: Secondary | ICD-10-CM | POA: Diagnosis not present

## 2022-04-10 DIAGNOSIS — M25532 Pain in left wrist: Secondary | ICD-10-CM

## 2022-04-10 DIAGNOSIS — F32A Depression, unspecified: Secondary | ICD-10-CM

## 2022-04-10 HISTORY — DX: Depression, unspecified: F32.A

## 2022-04-10 MED ORDER — ATORVASTATIN CALCIUM 10 MG PO TABS: 10.0000 mg | ORAL_TABLET | Freq: Every day | ORAL | 3 refills | Status: DC

## 2022-04-10 MED ORDER — SERTRALINE HCL 50 MG PO TABS: ORAL_TABLET | ORAL | 3 refills | Status: DC

## 2022-04-10 MED ORDER — CELECOXIB 200 MG PO CAPS: 200.0000 mg | ORAL_CAPSULE | Freq: Two times a day (BID) | ORAL | 1 refills | Status: DC

## 2022-04-10 MED ORDER — HYDROCHLOROTHIAZIDE 25 MG PO TABS: 25.0000 mg | ORAL_TABLET | Freq: Every day | ORAL | 3 refills | Status: DC

## 2022-04-10 NOTE — Progress Notes (Signed)
Established Patient Office Visit  Subjective   Patient ID: Megan [name], female   DOB: 03-Apr-1960 Age: 62 y.o. MRN: [id number]   Chief Complaint  Patient presents with   Follow-up    Mood/weight f/u Havent been on wegovy for 5 weeks  Arthritis in wrist Alzheimer's concern     HPI Pleasant 63 year old female presenting today for the following:  Weight: previously taking Wegovy 2.61m weekly but her insurance has not approved the medication. Has been out of the medication for 5 weeks and has noted that she has started gaining weight. Wants to get restarted back on the medication as soon as possible.   HLD: taking Atorvastatin 175mdaily, tolerating well without side effects. Following a low fat, heart healthy diet.   HTN: taking HCTZ 12.5-25mg daily as needed. Averaging about 2-3 doses per week depending on how her blood pressure looks. Tolerating the medication well without side effects. Following a low sodium diet. Denies CP, SOB, palpitations, lower extremity edema, dizziness, headaches, or vision changes.  Mood: taking Sertraline 5092maily, tolerating well without side effects. Feels that this medication is doing well to control her symptoms. Does have some additional life stress with the new granddaughter but this is manageable. Denies SI/HI.  Wrist pain: has been having pain in her bilateral wrists for the last 3-4 months. Taking Ibuprofen as needed but it isn't helping much.   Objective:    Vitals:   04/10/22 0857  BP: 136/84  Pulse: 81  Resp: 18  Height: 5' 4"$  (1.626 m)  Weight: 193 lb 1.9 oz (87.6 kg)  SpO2: 98%  BMI (Calculated): 33.13    Physical Exam Vitals reviewed.  Constitutional:      General: She is not in acute distress.    Appearance: Normal appearance. She is not ill-appearing.  HENT:     Head: Normocephalic and atraumatic.  Cardiovascular:     Rate and Rhythm: Normal rate and regular rhythm.     Pulses: Normal pulses.     Heart sounds: Normal  heart sounds.  Pulmonary:     Effort: Pulmonary effort is normal. No respiratory distress.     Breath sounds: Normal breath sounds. No wheezing, rhonchi or rales.  Skin:    General: Skin is warm and dry.  Neurological:     Mental Status: She is alert and oriented to person, place, and time.  Psychiatric:        Mood and Affect: Mood normal.        Behavior: Behavior normal.        Thought Content: Thought content normal.        Judgment: Judgment normal.   No results found for this or any previous visit (from the past 24 hour(s)).     The 10-year ASCVD risk score (Arnett DK, et al., 2019) is: 5.6%   Values used to calculate the score:     Age: [age]     Sex: Female     Is Non-Hispanic African American: No     Diabetic: No     Tobacco smoker: No     Systolic Blood Pressure: 136XX123456Hg     Is BP treated: Yes     HDL Cholesterol: 78 mg/dL     Total Cholesterol: 232 mg/dL   Assessment & Plan:   1. Essential hypertension BP looks ok today. Checking labs as below. Continue HCTZ 12.5-25mg daily.  - hydrochlorothiazide (HYDRODIURIL) 25 MG tablet; Take 1 tablet (25 mg total) by mouth  daily.  Dispense: 90 tablet; Refill: 3 - CBC with Differential/Platelet - COMPLETE METABOLIC PANEL WITH GFR - Lipid panel  2. Anxiety and depression Stable. Continue Zoloft 64m daily.  - sertraline (ZOLOFT) 50 MG tablet; TAKE 1 TABLET(50 MG) BY MOUTH AT BEDTIME  Dispense: 90 tablet; Refill: 3  3. Major depressive disorder with single episode, in full remission ([place] Continue sertraline as above. - sertraline (ZOLOFT) 50 MG tablet; TAKE 1 TABLET(50 MG) BY MOUTH AT BEDTIME  Dispense: 90 tablet; Refill: 3  4. Hyperlipidemia, unspecified hyperlipidemia type Checking labs today.  Continue atorvastatin 10 mg daily. - COMPLETE METABOLIC PANEL WITH GFR - Lipid panel  5. Bilateral wrist pain Getting bilateral wrist x-rays.  Restarting Celebrex 200 mg twice daily as needed. - DG Wrist Complete Left;  Future - DG Wrist Complete Right; Future  Return in about 6 months (around 10/09/2022) for chronic disease follow up.  ___________________________________________ JClearnce Sorrel DNP, APRN, FNP-BC [facility] [place]

## 2022-04-10 NOTE — Telephone Encounter (Addendum)
Initiated appeal [id number] 2.'4MG'$ /0.75ML auto-injectors Via: rx b prompt Case/Key:n/a Status: approved  as of 04/10/22 Reason:04/21/23 Notified Pt via: Mychart

## 2022-04-11 ENCOUNTER — Encounter: Payer: Self-pay | Admitting: Gastroenterology

## 2022-04-11 LAB — COMPLETE METABOLIC PANEL WITH GFR
AG Ratio: 1.8 (calc) (ref 1.0–2.5)
ALT: 9 U/L (ref 6–29)
AST: 14 U/L (ref 10–35)
Albumin: 4.4 g/dL (ref 3.6–5.1)
Alkaline phosphatase (APISO): 89 U/L (ref 37–153)
BUN: 15 mg/dL (ref 7–25)
CO2: 26 mmol/L (ref 20–32)
Calcium: 9.5 mg/dL (ref 8.6–10.4)
Chloride: 110 mmol/L (ref 98–110)
Creat: 0.81 mg/dL (ref 0.50–1.05)
Globulin: 2.4 g/dL (calc) (ref 1.9–3.7)
Glucose, Bld: 108 mg/dL — ABNORMAL HIGH (ref 65–99)
Potassium: 4.4 mmol/L (ref 3.5–5.3)
Sodium: 144 mmol/L (ref 135–146)
Total Bilirubin: 0.7 mg/dL (ref 0.2–1.2)
Total Protein: 6.8 g/dL (ref 6.1–8.1)
eGFR: 82 mL/min/{1.73_m2} (ref >=60)

## 2022-04-11 LAB — CBC WITH DIFFERENTIAL/PLATELET
Absolute Monocytes: 354 cells/uL (ref 200–950)
Basophils Absolute: 18 cells/uL (ref 0–200)
Basophils Relative: 0.4 %
Eosinophils Absolute: 69 cells/uL (ref 15–500)
Eosinophils Relative: 1.5 %
HCT: 34.7 % — ABNORMAL LOW (ref 35.0–45.0)
Hemoglobin: 11.3 g/dL — ABNORMAL LOW (ref 11.7–15.5)
Lymphs Abs: 1058 cells/uL (ref 850–3900)
MCH: 26.7 pg — ABNORMAL LOW (ref 27.0–33.0)
MCHC: 32.6 g/dL (ref 32.0–36.0)
MCV: 82 fL (ref 80.0–100.0)
MPV: 12.3 fL (ref 7.5–12.5)
Monocytes Relative: 7.7 %
Neutro Abs: 3100 cells/uL (ref 1500–7800)
Neutrophils Relative %: 67.4 %
Platelets: 207 10*3/uL (ref 140–400)
RBC: 4.23 10*6/uL (ref 3.80–5.10)
RDW: 14 % (ref 11.0–15.0)
Total Lymphocyte: 23 %
WBC: 4.6 10*3/uL (ref 3.8–10.8)

## 2022-04-11 LAB — LIPID PANEL
Cholesterol: 177 mg/dL (ref <200)
HDL: 86 mg/dL (ref >=50)
LDL Cholesterol (Calc): 70 mg/dL (calc)
Non-HDL Cholesterol (Calc): 91 mg/dL (calc) (ref <130)
Total CHOL/HDL Ratio: 2.1 (calc) (ref <5.0)
Triglycerides: 120 mg/dL (ref <150)

## 2022-04-14 ENCOUNTER — Encounter: Payer: Self-pay | Admitting: Gastroenterology

## 2022-04-23 ENCOUNTER — Encounter: Payer: Self-pay | Admitting: Medical-Surgical

## 2022-04-23 NOTE — Telephone Encounter (Signed)
Should be all right to take Celebrex.  Always lesser the better RG

## 2022-05-08 ENCOUNTER — Ambulatory Visit (INDEPENDENT_AMBULATORY_CARE_PROVIDER_SITE_OTHER): Payer: 59

## 2022-05-08 ENCOUNTER — Encounter: Payer: Self-pay | Admitting: Gastroenterology

## 2022-05-08 VITALS — BP 127/85 | HR 76 | Temp 98.0°F | Resp 18 | Ht 63.0 in | Wt 193.0 lb

## 2022-05-08 DIAGNOSIS — K50018 Crohn's disease of small intestine with other complication: Secondary | ICD-10-CM

## 2022-05-08 MED ORDER — VEDOLIZUMAB 300 MG IV SOLR
300.0000 mg | Freq: Once | INTRAVENOUS | Status: AC
  Administered 2022-05-08: 300 mg via INTRAVENOUS
  Filled 2022-05-08: qty 5

## 2022-05-08 NOTE — Progress Notes (Signed)
Diagnosis: Crohn's Disease  Provider:  Marshell Garfinkel MD  Procedure: Infusion  IV Type: Peripheral, IV Location: L Antecubital  Entyvio (Vedolizumab), Dose: 300 mg  Infusion Start Time: 0929  Infusion Stop Time: 1005  Post Infusion IV Care: Observation period completed and Peripheral IV Discontinued  Discharge: Condition: Good, Destination: Home . AVS Declined  Performed by:  Fraser Din [place], RN

## 2022-05-08 NOTE — Progress Notes (Signed)
Diagnosis: Crohn's Disease  Provider:  Marshell Garfinkel MD  Procedure: Infusion  IV Type: Peripheral, IV Location: L Forearm  Entyvio (Vedolizumab), Dose: 300 mg  Infusion Start Time: 0929  Infusion Stop Time: 1005 am  Post Infusion IV Care: Observation period completed and Peripheral IV Discontinued  Discharge: Condition: Good, Destination: Home . AVS Declined  Performed by:  Cleophus Molt, RN

## 2022-05-12 ENCOUNTER — Other Ambulatory Visit: Payer: Self-pay

## 2022-05-12 ENCOUNTER — Other Ambulatory Visit: Payer: Self-pay | Admitting: Medical-Surgical

## 2022-05-12 DIAGNOSIS — Z1231 Encounter for screening mammogram for malignant neoplasm of breast: Secondary | ICD-10-CM

## 2022-05-14 ENCOUNTER — Ambulatory Visit (INDEPENDENT_AMBULATORY_CARE_PROVIDER_SITE_OTHER): Payer: 59

## 2022-05-14 DIAGNOSIS — Z1231 Encounter for screening mammogram for malignant neoplasm of breast: Secondary | ICD-10-CM | POA: Diagnosis not present

## 2022-05-21 ENCOUNTER — Encounter: Payer: Self-pay | Admitting: Medical-Surgical

## 2022-05-21 MED ORDER — FLUCONAZOLE 150 MG PO TABS: 150.0000 mg | ORAL_TABLET | Freq: Once | ORAL | 0 refills | Status: AC

## 2022-06-09 ENCOUNTER — Other Ambulatory Visit: Payer: Self-pay | Admitting: Medical-Surgical

## 2022-07-01 ENCOUNTER — Other Ambulatory Visit: Payer: Self-pay | Admitting: Gastroenterology

## 2022-07-01 DIAGNOSIS — K219 Gastro-esophageal reflux disease without esophagitis: Secondary | ICD-10-CM

## 2022-07-03 ENCOUNTER — Ambulatory Visit (INDEPENDENT_AMBULATORY_CARE_PROVIDER_SITE_OTHER): Payer: 59 | Admitting: *Deleted

## 2022-07-03 VITALS — BP 129/80 | HR 70 | Temp 98.1°F | Resp 16 | Ht 63.0 in | Wt 193.0 lb

## 2022-07-03 DIAGNOSIS — K50018 Crohn's disease of small intestine with other complication: Secondary | ICD-10-CM

## 2022-07-03 MED ORDER — VEDOLIZUMAB 300 MG IV SOLR
300.0000 mg | Freq: Once | INTRAVENOUS | Status: AC
  Administered 2022-07-03: 300 mg via INTRAVENOUS
  Filled 2022-07-03: qty 5

## 2022-07-03 NOTE — Progress Notes (Signed)
Diagnosis: Crohn's Disease  Provider:  Chilton Greathouse MD  Procedure: IV Infusion  IV Type: Peripheral, IV Location: L Forearm  Entyvio (Vedolizumab), Dose: 300 mg  Infusion Start Time: 1027  am  Infusion Stop Time: 1059 am  Post Infusion IV Care: Observation period completed and Peripheral IV Discontinued  Discharge: Condition: Good, Destination: Home . AVS Provided  Performed by:  Forrest Moron, RN

## 2022-07-10 ENCOUNTER — Other Ambulatory Visit: Payer: Self-pay | Admitting: Medical-Surgical

## 2022-07-10 DIAGNOSIS — E6609 Other obesity due to excess calories: Secondary | ICD-10-CM

## 2022-08-13 ENCOUNTER — Other Ambulatory Visit: Payer: Self-pay | Admitting: Medical-Surgical

## 2022-08-28 ENCOUNTER — Ambulatory Visit (INDEPENDENT_AMBULATORY_CARE_PROVIDER_SITE_OTHER): Payer: Managed Care, Other (non HMO)

## 2022-08-28 ENCOUNTER — Encounter: Payer: Self-pay | Admitting: Gastroenterology

## 2022-08-28 VITALS — BP 130/81 | HR 72 | Temp 98.1°F | Resp 18 | Ht 63.0 in | Wt 198.0 lb

## 2022-08-28 DIAGNOSIS — K50018 Crohn's disease of small intestine with other complication: Secondary | ICD-10-CM

## 2022-08-28 MED ORDER — VEDOLIZUMAB 300 MG IV SOLR
300.0000 mg | Freq: Once | INTRAVENOUS | Status: AC
  Administered 2022-08-28: 300 mg via INTRAVENOUS
  Filled 2022-08-28: qty 5

## 2022-08-28 NOTE — Progress Notes (Signed)
Diagnosis: Crohn's Disease  Provider:  Chilton Greathouse MD  Procedure: IV Infusion  IV Type: Peripheral, IV Location: L Hand  Entyvio (Vedolizumab), Dose: 300 mg  Infusion Start Time: 0911  Infusion Stop Time: 0950  Post Infusion IV Care: Peripheral IV Discontinued  Discharge: Condition: Good, Destination: Home . AVS Declined  Performed by:  Garnette Czech, RN

## 2022-09-24 ENCOUNTER — Ambulatory Visit: Payer: Managed Care, Other (non HMO)

## 2022-09-24 ENCOUNTER — Ambulatory Visit (INDEPENDENT_AMBULATORY_CARE_PROVIDER_SITE_OTHER): Payer: Managed Care, Other (non HMO) | Admitting: Sports Medicine

## 2022-09-24 DIAGNOSIS — M1611 Unilateral primary osteoarthritis, right hip: Secondary | ICD-10-CM | POA: Diagnosis not present

## 2022-09-24 MED ORDER — ACETAMINOPHEN ER 650 MG PO TBCR
EXTENDED_RELEASE_TABLET | ORAL | Status: AC
Start: 2022-09-24 — End: ?

## 2022-09-24 NOTE — Assessment & Plan Note (Signed)
This is a very pleasant 61 year old female, chronic right hip pain localized anteriorly, worse with weightbearing. On exam she has pain with internal rotation, I did personally review her x-rays from 2022 which do show right worse than left hip osteoarthritis. She takes Celebrex only occasionally, we will have her increase her Celebrex to twice daily for about 2 weeks and then as needed. We will also have her add Tylenol 650 twice daily with the Celebrex for 2 weeks and then as needed. I would like updated x-rays and she will do some home physical therapy, return to see me in about 6 weeks, injection if not better.

## 2022-09-24 NOTE — Progress Notes (Signed)
    Procedures performed today:    None.  Independent interpretation of notes and tests performed by another provider:   Hip x-rays from 2022 personally reviewed and do show right worse than left hip osteoarthritis with subchondral sclerosis, peripheral osteophytosis.  Brief History, Exam, Impression, and Recommendations:    Primary osteoarthritis of right hip This is a very pleasant 62 year old female, chronic right hip pain localized anteriorly, worse with weightbearing. On exam she has pain with internal rotation, I did personally review her x-rays from 2022 which do show right worse than left hip osteoarthritis. She takes Celebrex only occasionally, we will have her increase her Celebrex to twice daily for about 2 weeks and then as needed. We will also have her add Tylenol 650 twice daily with the Celebrex for 2 weeks and then as needed. I would like updated x-rays and she will do some home physical therapy, return to see me in about 6 weeks, injection if not better.    ____________________________________________ Ihor Austin. Benjamin Stain, M.D., ABFM., CAQSM., AME. Primary Care and Sports Medicine [facility]  Adjunct Professor of Family Medicine  [place] [employer] [employer]  Restaurant manager, fast food

## 2022-09-29 ENCOUNTER — Encounter: Payer: Self-pay | Admitting: Medical-Surgical

## 2022-09-30 ENCOUNTER — Encounter: Payer: Self-pay | Admitting: Medical-Surgical

## 2022-10-03 ENCOUNTER — Other Ambulatory Visit: Payer: Self-pay | Admitting: Medical-Surgical

## 2022-10-03 DIAGNOSIS — E6609 Other obesity due to excess calories: Secondary | ICD-10-CM

## 2022-10-09 ENCOUNTER — Ambulatory Visit (INDEPENDENT_AMBULATORY_CARE_PROVIDER_SITE_OTHER): Payer: Managed Care, Other (non HMO) | Admitting: Medical-Surgical

## 2022-10-09 ENCOUNTER — Encounter: Payer: Self-pay | Admitting: Medical-Surgical

## 2022-10-09 VITALS — BP 122/86 | HR 87 | Ht 64.0 in | Wt 197.1 lb

## 2022-10-09 DIAGNOSIS — M797 Fibromyalgia: Secondary | ICD-10-CM | POA: Diagnosis not present

## 2022-10-09 DIAGNOSIS — Z124 Encounter for screening for malignant neoplasm of cervix: Secondary | ICD-10-CM

## 2022-10-09 DIAGNOSIS — K50019 Crohn's disease of small intestine with unspecified complications: Secondary | ICD-10-CM

## 2022-10-09 DIAGNOSIS — I10 Essential (primary) hypertension: Secondary | ICD-10-CM

## 2022-10-09 DIAGNOSIS — E785 Hyperlipidemia, unspecified: Secondary | ICD-10-CM

## 2022-10-09 DIAGNOSIS — F419 Anxiety disorder, unspecified: Secondary | ICD-10-CM

## 2022-10-09 DIAGNOSIS — K219 Gastro-esophageal reflux disease without esophagitis: Secondary | ICD-10-CM

## 2022-10-09 DIAGNOSIS — E6609 Other obesity due to excess calories: Secondary | ICD-10-CM

## 2022-10-09 DIAGNOSIS — F32A Depression, unspecified: Secondary | ICD-10-CM

## 2022-10-09 MED ORDER — ESTRADIOL 1 MG PO TABS: 1.0000 mg | ORAL_TABLET | Freq: Every day | ORAL | 0 refills | Status: DC

## 2022-10-09 NOTE — Progress Notes (Signed)
        Established patient visit  History, exam, impression, and plan:  1. Essential hypertension Pleasant 62 year old female presenting today with a history of hypertension currently taking hydrochlorothiazide 25 mg daily, tolerating well without side effects.  Following a low-sodium diet.  Activity as tolerated although this is limited by orthopedic concerns.Denies CP, SOB, palpitations, lower extremity edema, dizziness, headaches, or vision changes.  Blood pressure is at goal.  Continue hydrochlorothiazide as prescribed.  2. Anxiety and depression Currently taking Zoloft 50 mg daily, tolerating well without side effects.  She has been on this long-term and feels the medication still works well.  Mood, thought pattern, affect, speech, and cognition normal.  Denies SI/HI.  Continue sertraline as prescribed.  3. Fibromyalgia History of fibromyalgia complicated by orthopedic concerns.  Using Celebrex 200 mg twice daily as needed.  Feels this works well for her although she does still have breakthrough pains.  Aware of recommendations for weight loss and regular intentional exercise to help with symptoms.  Continue Celebrex as needed.  Follow-up with sports medicine/orthopedics as instructed.  4. Gastroesophageal reflux disease without esophagitis Managed by GI.  Taking Protonix 40 mg daily which seems to work well for her.  5. Hyperlipidemia, unspecified hyperlipidemia type Taking atorvastatin 10 mg daily, tolerating well without side effects.  Following a low-fat diet.  Up-to-date on lipid checks.  Continue atorvastatin.  6. Class 1 obesity due to excess calories with serious comorbidity and body mass index (BMI) of 33.0 to 33.9 in adult Taking Wegovy 2.4 mg weekly and has been on this long-term.  She asked that the maximum dose and feels that the medication is not working as well for her.  Does not Mitt that she is not exercising regularly due to orthopedic issues and this is likely affecting  her results.  Discussed working on dietary modifications and aiming for increased intentional exercise to maximize benefit.  7. Crohn's disease of ileum with complication (HCC) Managed by GI.  8. Cervical cancer screening Unsure when her last Pap smear was but reports that she has never been with a female partner and is worried about the procedure being painful.  She had tried this in the past with a different provider but it was too painful and ended up being sent to gynecology.  She is already scheduled to have this updated here in our office.  Reviewed options to help make this more manageable.  Adding estradiol 1 mg tablets intravaginally at bedtime until her procedure next week.   Procedures performed this visit: None.  Return in about 6 months (around 04/11/2023) for chronic disease follow up.  __________________________________ Thayer Ohm, DNP, APRN, FNP-BC Primary Care and Sports Medicine [facility] [place]

## 2022-10-10 ENCOUNTER — Ambulatory Visit: Payer: Managed Care, Other (non HMO) | Admitting: Medical-Surgical

## 2022-10-14 ENCOUNTER — Ambulatory Visit (INDEPENDENT_AMBULATORY_CARE_PROVIDER_SITE_OTHER): Payer: Managed Care, Other (non HMO) | Admitting: Medical-Surgical

## 2022-10-14 ENCOUNTER — Other Ambulatory Visit (HOSPITAL_COMMUNITY)
Admission: RE | Admit: 2022-10-14 | Discharge: 2022-10-14 | Disposition: A | Discharge status: Home / Self Care | Payer: Managed Care, Other (non HMO) | Source: Ambulatory Visit | Attending: Medical-Surgical | Admitting: Medical-Surgical

## 2022-10-14 ENCOUNTER — Encounter: Payer: Self-pay | Admitting: Medical-Surgical

## 2022-10-14 VITALS — BP 120/85 | HR 93 | Ht 64.0 in | Wt 196.1 lb

## 2022-10-14 DIAGNOSIS — Z124 Encounter for screening for malignant neoplasm of cervix: Secondary | ICD-10-CM

## 2022-10-14 NOTE — Progress Notes (Signed)
        Established patient visit  History, exam, impression, and plan:  1. Cervical cancer screening Pleasant 62 year old female presenting today for completion of a Pap smear for cervical cancer screening.  This been quite a while since she has had her last Pap smear.  She is in a same-sex relationship and reports never having a female sexual partner.  Her last Pap smear was completed at a gynecologist due to intolerance of the procedure and primary care.  Today, her exam was completed using a small speculum with minimal vaginal dilation.  A sample was obtained and will be sent for cytology as well as HPV cotesting.  Bimanual exam completed, well-tolerated.  Reviewed screening recommendations.  If her Pap smear is normal with negative HPV, plan to review need for rescreening at the age of 64 but this will be optional unless there are concerning findings. - Cytology - PAP  Physical Exam Exam conducted with a chaperone present.  Constitutional:      General: She is not in acute distress.    Appearance: Normal appearance. She is obese. She is not ill-appearing.  HENT:     Head: Normocephalic and atraumatic.  Cardiovascular:     Rate and Rhythm: Normal rate.  Pulmonary:     Effort: Pulmonary effort is normal. No respiratory distress.  Abdominal:     General: There is no distension.     Palpations: Abdomen is soft.     Tenderness: There is no abdominal tenderness.  Genitourinary:    Exam position: Lithotomy position.     Labia:        Right: No rash, tenderness, lesion or injury.        Left: No rash, tenderness, lesion or injury.      Vagina: Normal.     Cervix: Normal.     Uterus: Normal.      Adnexa: Right adnexa normal and left adnexa normal.     Comments: Procedure completed with moderate discomfort with attempt to open the  speculum. Limited visibility during exam and collection of the specimen.  Skin:    General: Skin is warm and dry.  Neurological:     Mental Status: She is  alert and oriented to person, place, and time.  Psychiatric:        Mood and Affect: Mood normal.        Behavior: Behavior normal.        Thought Content: Thought content normal.        Judgment: Judgment normal.    Procedures performed this visit: None.  Return if symptoms worsen or fail to improve.  __________________________________ Thayer Ohm, DNP, APRN, FNP-BC Primary Care and Sports Medicine [facility] [place]

## 2022-10-15 LAB — CYTOLOGY - PAP
Comment: NEGATIVE
Diagnosis: NEGATIVE
High risk HPV: NEGATIVE

## 2022-10-23 ENCOUNTER — Ambulatory Visit: Payer: Managed Care, Other (non HMO)

## 2022-10-23 VITALS — BP 108/72 | HR 75 | Temp 98.0°F | Resp 16 | Ht 63.0 in | Wt 198.8 lb

## 2022-10-23 DIAGNOSIS — K50018 Crohn's disease of small intestine with other complication: Secondary | ICD-10-CM | POA: Diagnosis not present

## 2022-10-23 MED ORDER — VEDOLIZUMAB 300 MG IV SOLR
300.0000 mg | Freq: Once | INTRAVENOUS | Status: AC
  Administered 2022-10-23: 300 mg via INTRAVENOUS
  Filled 2022-10-23: qty 5

## 2022-10-23 NOTE — Progress Notes (Signed)
Diagnosis: Acute Anemia  Provider:  Chilton Greathouse MD  Procedure: IV Infusion  IV Type: Peripheral, IV Location: Left wrist  Venofer (Iron Sucrose), Dose: 300 mg  Infusion Start Time: 0906  Infusion Stop Time: 0936  Post Infusion IV Care: Patient declined observation and Peripheral IV Discontinued  Discharge: Condition: Good, Destination: Home . AVS Declined  Performed by:  Nat Math, RN

## 2022-11-05 ENCOUNTER — Ambulatory Visit (INDEPENDENT_AMBULATORY_CARE_PROVIDER_SITE_OTHER): Payer: Managed Care, Other (non HMO) | Admitting: Sports Medicine

## 2022-11-05 DIAGNOSIS — M1611 Unilateral primary osteoarthritis, right hip: Secondary | ICD-10-CM

## 2022-11-05 NOTE — Progress Notes (Signed)
    Procedures performed today:    None.  Independent interpretation of notes and tests performed by another provider:   None.  Brief History, Exam, Impression, and Recommendations:    Primary osteoarthritis of right hip Very pleasant 63 year old female with bilateral right worse than left hip osteoarthritis confirmed on x-rays, Celebrex twice daily and arthritis Tylenol twice daily as well as home PT have been effective, her pain is minimal, does not affect her quality of life, she can live with it for now.    ____________________________________________ Ihor Austin. Benjamin Stain, M.D., ABFM., CAQSM., AME. Primary Care and Sports Medicine [facility]  Adjunct Professor of Family Medicine  [place] [employer]  Restaurant manager, fast food

## 2022-11-05 NOTE — Assessment & Plan Note (Signed)
Very pleasant 62 year old female with bilateral right worse than left hip osteoarthritis confirmed on x-rays, Celebrex twice daily and arthritis Tylenol twice daily as well as home PT have been effective, her pain is minimal, does not affect her quality of life, she can live with it for now.

## 2022-11-06 ENCOUNTER — Other Ambulatory Visit: Payer: Self-pay | Admitting: Medical-Surgical

## 2022-11-11 ENCOUNTER — Other Ambulatory Visit: Payer: Self-pay | Admitting: Medical-Surgical

## 2022-11-12 ENCOUNTER — Encounter: Payer: Self-pay | Admitting: Medical-Surgical

## 2022-11-14 ENCOUNTER — Other Ambulatory Visit: Payer: Self-pay | Admitting: Medical-Surgical

## 2022-11-14 MED ORDER — ATORVASTATIN CALCIUM 10 MG PO TABS: 10.0000 mg | ORAL_TABLET | Freq: Every day | ORAL | 3 refills | Status: DC

## 2022-11-27 ENCOUNTER — Other Ambulatory Visit: Payer: Self-pay | Admitting: Medical-Surgical

## 2022-11-27 MED ORDER — CELECOXIB 200 MG PO CAPS: 200.0000 mg | ORAL_CAPSULE | Freq: Two times a day (BID) | ORAL | 3 refills | Status: DC

## 2022-12-11 ENCOUNTER — Encounter: Payer: Self-pay | Admitting: Gastroenterology

## 2022-12-18 ENCOUNTER — Ambulatory Visit (INDEPENDENT_AMBULATORY_CARE_PROVIDER_SITE_OTHER): Payer: Managed Care, Other (non HMO) | Admitting: *Deleted

## 2022-12-18 VITALS — BP 139/83 | HR 68 | Temp 98.3°F | Resp 20 | Ht 63.0 in | Wt 198.0 lb

## 2022-12-18 DIAGNOSIS — K50018 Crohn's disease of small intestine with other complication: Secondary | ICD-10-CM | POA: Diagnosis not present

## 2022-12-18 MED ORDER — VEDOLIZUMAB 300 MG IV SOLR
300.0000 mg | Freq: Once | INTRAVENOUS | Status: AC
Start: 2022-12-18 — End: 2022-12-18
  Administered 2022-12-18: 300 mg via INTRAVENOUS
  Filled 2022-12-18: qty 5

## 2022-12-18 NOTE — Progress Notes (Signed)
Diagnosis: Crohn's Disease  Provider:  Chilton Greathouse MD  Procedure: IV Infusion  IV Type: Peripheral, IV Location: L Forearm  Entyvio (Vedolizumab), Dose: 300 mg  Infusion Start Time: 0851 am  Infusion Stop Time: 0930 am  Post Infusion IV Care: Observation period completed and Peripheral IV Discontinued  Discharge: Condition: Good, Destination: Home . AVS Declined  Performed by:  Forrest Moron, RN

## 2022-12-27 ENCOUNTER — Other Ambulatory Visit: Payer: Self-pay | Admitting: Medical-Surgical

## 2022-12-27 DIAGNOSIS — Z6832 Body mass index (BMI) 32.0-32.9, adult: Secondary | ICD-10-CM

## 2022-12-27 DIAGNOSIS — E6609 Other obesity due to excess calories: Secondary | ICD-10-CM

## 2022-12-27 DIAGNOSIS — F325 Major depressive disorder, single episode, in full remission: Secondary | ICD-10-CM

## 2022-12-27 DIAGNOSIS — F419 Anxiety disorder, unspecified: Secondary | ICD-10-CM

## 2023-02-05 ENCOUNTER — Other Ambulatory Visit: Payer: Self-pay | Admitting: Medical-Surgical

## 2023-02-08 ENCOUNTER — Encounter: Payer: Self-pay | Admitting: Medical-Surgical

## 2023-02-09 MED ORDER — ATORVASTATIN CALCIUM 10 MG PO TABS: 10.0000 mg | ORAL_TABLET | Freq: Every day | ORAL | 2 refills | Status: DC

## 2023-02-12 ENCOUNTER — Ambulatory Visit: Payer: Managed Care, Other (non HMO) | Admitting: *Deleted

## 2023-02-12 VITALS — BP 129/83 | HR 73 | Temp 98.3°F | Resp 16 | Ht 66.0 in | Wt 194.2 lb

## 2023-02-12 DIAGNOSIS — K50018 Crohn's disease of small intestine with other complication: Secondary | ICD-10-CM

## 2023-02-12 MED ORDER — VEDOLIZUMAB 300 MG IV SOLR
300.0000 mg | Freq: Once | INTRAVENOUS | Status: AC
Start: 2023-02-12 — End: 2023-02-12
  Administered 2023-02-12: 300 mg via INTRAVENOUS
  Filled 2023-02-12: qty 5

## 2023-02-12 NOTE — Progress Notes (Signed)
Diagnosis: Crohn's Disease  Provider:  Chilton Greathouse MD  Procedure: IV Infusion  IV Type: Peripheral, IV Location: R Hand  Entyvio (Vedolizumab), Dose: 300 mg  Infusion Start Time: 0923  am  Infusion Stop Time: 0955 am  Post Infusion IV Care: Observation period completed  Discharge: Condition: Good, Destination: Home . AVS Declined  Performed by:  Forrest Moron, RN

## 2023-03-18 ENCOUNTER — Other Ambulatory Visit: Payer: Self-pay | Admitting: Medical-Surgical

## 2023-03-18 DIAGNOSIS — E66811 Obesity, class 1: Secondary | ICD-10-CM

## 2023-04-07 ENCOUNTER — Other Ambulatory Visit: Payer: Self-pay | Admitting: Gastroenterology

## 2023-04-09 ENCOUNTER — Ambulatory Visit: Payer: Managed Care, Other (non HMO)

## 2023-04-13 ENCOUNTER — Ambulatory Visit (INDEPENDENT_AMBULATORY_CARE_PROVIDER_SITE_OTHER): Payer: Managed Care, Other (non HMO) | Admitting: Medical-Surgical

## 2023-04-13 ENCOUNTER — Other Ambulatory Visit: Payer: Self-pay | Admitting: Medical-Surgical

## 2023-04-13 ENCOUNTER — Encounter: Payer: Self-pay | Admitting: Medical-Surgical

## 2023-04-13 VITALS — BP 125/84 | HR 91 | Resp 20 | Ht 66.0 in | Wt 197.0 lb

## 2023-04-13 DIAGNOSIS — I10 Essential (primary) hypertension: Secondary | ICD-10-CM

## 2023-04-13 DIAGNOSIS — Z6833 Body mass index (BMI) 33.0-33.9, adult: Secondary | ICD-10-CM

## 2023-04-13 DIAGNOSIS — K449 Diaphragmatic hernia without obstruction or gangrene: Secondary | ICD-10-CM

## 2023-04-13 DIAGNOSIS — F5101 Primary insomnia: Secondary | ICD-10-CM

## 2023-04-13 DIAGNOSIS — F419 Anxiety disorder, unspecified: Secondary | ICD-10-CM

## 2023-04-13 DIAGNOSIS — E66811 Obesity, class 1: Secondary | ICD-10-CM

## 2023-04-13 DIAGNOSIS — F32A Depression, unspecified: Secondary | ICD-10-CM

## 2023-04-13 DIAGNOSIS — K219 Gastro-esophageal reflux disease without esophagitis: Secondary | ICD-10-CM

## 2023-04-13 DIAGNOSIS — E6609 Other obesity due to excess calories: Secondary | ICD-10-CM

## 2023-04-13 DIAGNOSIS — E785 Hyperlipidemia, unspecified: Secondary | ICD-10-CM | POA: Diagnosis not present

## 2023-04-13 DIAGNOSIS — K50019 Crohn's disease of small intestine with unspecified complications: Secondary | ICD-10-CM

## 2023-04-13 DIAGNOSIS — F325 Major depressive disorder, single episode, in full remission: Secondary | ICD-10-CM

## 2023-04-13 MED ORDER — HYDROCHLOROTHIAZIDE 25 MG PO TABS: 25.0000 mg | ORAL_TABLET | Freq: Every day | ORAL | 3 refills | Status: AC

## 2023-04-13 MED ORDER — SERTRALINE HCL 50 MG PO TABS: ORAL_TABLET | ORAL | 3 refills | Status: AC

## 2023-04-13 MED ORDER — WEGOVY 2.4 MG/0.75ML ~~LOC~~ SOAJ: 2.4000 mg | SUBCUTANEOUS | 3 refills | Status: DC

## 2023-04-13 MED ORDER — BUPROPION HCL ER (XL) 150 MG PO TB24: 150.0000 mg | ORAL_TABLET | Freq: Every day | ORAL | 1 refills | Status: DC

## 2023-04-13 MED ORDER — ATORVASTATIN CALCIUM 10 MG PO TABS: 10.0000 mg | ORAL_TABLET | Freq: Every day | ORAL | 3 refills | Status: AC

## 2023-04-13 NOTE — Progress Notes (Unsigned)
 Subjective:  Patient ID: Megan [name], female    DOB: Oct 20, 1960, 63 y.o.   MRN: [id number]  Patient Care Team: Christen Butter, NP as PCP - General (Nurse Practitioner)   Chief Complaint:  Hypertension and Weight Check   HPI:  Megan [name] is a 63 y.o. female presenting on 04/13/2023 for Hypertension and Weight Check   History, Exam,  Impression and Plan  1. Essential hypertension (Primary) Pleasant 63 year old Caucasian female presents for hypertension follow-up.  Patient taking hydrochlorothiazide 25 mg without problem or reported side effects.  Patient denies any lower extremity swelling, chest pain, palpitations, arm pain, or blurry vision.  Patient reports occasionally missing an occasional dose of hydrochlorothiazide states that her systolic blood pressure only gets up to 140s systolic.  However while taking hydrochlorothiazide blood pressure runs 120s to130s systolic. BP is at goal continue hydrochlorothiazide.   - hydrochlorothiazide (HYDRODIURIL) 25 MG tablet; Take 1 tablet (25 mg total) by mouth daily.  Dispense: 90 tablet; Refill: 3 - CBC with Differential/Platelet - CMP14+EGFR - Lipid panel  2. Hiatal hernia with GERD Symptoms well-controlled on Protonix denies any side effect or problems. Denies reflux except when she forgets to take Protonix. Pt educated on increased risk of bone loss and GI infections or complications. Continue Protonix daily as prescribed. Denies any need of refills  3. Hyperlipidemia, unspecified hyperlipidemia type Patient admits to not walking or exercising as much as she would like, or needs, to however she stays active taking care of grandchild. Pt continuing to make dietary modifications. CMP and Lipid panel today. Continue atorvastatin. -  Refill and continue atorvastatin (LIPITOR) 10 MG tablet; Take 1 tablet (10 mg total) by mouth daily.  Dispense: 90 tablet; Refill: 3 - CMP14+EGFR - Lipid panel  4. Primary insomnia Reports difficulty  getting back to sleep after up to take care of her grandchild when her daughter leaves for work at 0300 in the morning. Offered medication to improve sleep quality patient deferred for now.  5. Class 1 obesity due to excess calories with serious comorbidity and body mass index (BMI) of 33.0 to 33.9 in adult [name] weight holding steady at 197 pounds. Patient mildly frustrated that she has "plateaued" in regard to weight loss. Tolerating Wegovy without complications or difficulty.  Start Wellbutrin as adjunct to [facility] for weight loss and appetite suppression. Continue Wegovy as prescribed. -    Refill and continue Semaglutide-Weight Management (WEGOVY) 2.4 MG/0.75ML SOAJ; Inject 2.4 mg into the skin once a week.  Dispense: 12 mL; Refill: 3 -     Start as adjunct to buPROPion (WELLBUTRIN XL) 150 MG 24 hr tablet; Take 1 tablet (150 mg total) by mouth daily.  6. Anxiety and depression 7. Major depressive disorder with single episode, in full remission (HCC) States her anxiety and depression with associated irritability are well manged by Zoloft.  Denies any complications or side effects from Zoloft.   Denies SI/HI.  Patient not interested in changing regimen at this time.  Observed energetic and interactive affect without overt signs of depression or anxiety.  Continue Zoloft. - Refill and continue sertraline (ZOLOFT) 50 MG tablet; TAKE 1 TABLET(50 MG) BY MOUTH AT BEDTIME  Dispense: 90 tablet; Refill: 3  8. Crohn's disease of ileum with complication (HCC) Managed by Dr. Chilton Greathouse gastroenterology.  Historical treatment with Entyvio IV since diagnosis at last colonoscopy on 04/10/2021. Last Entyvio IV infusion was 10/23/2022   Continue all other maintenance medications.  Follow up plan: Return  in about 6 months for chronic diseases (around 10/11/2023).  Relevant past medical, surgical, family, and social history reviewed and updated as indicated.  Allergies and medications reviewed and updated.  Data reviewed: Chart in Epic.   Past Medical History:  Diagnosis Date   Acute respiratory failure (HCC)    Anxiety and depression 04/10/2022   Arthritis    Crohn's disease (HCC)    Fibromyalgia    GERD (gastroesophageal reflux disease)    Hypertension    Major depressive disorder with single episode, in full remission (HCC) 06/15/2017   Menopausal vasomotor syndrome    Obesity    Pre-diabetes    RSV (respiratory syncytial virus pneumonia)     Past Surgical History:  Procedure Laterality Date   ANKLE ARTHROSCOPY Left 1985   COLONOSCOPY  2011   IN [place]=diverticulosis   DILATION AND CURETTAGE OF UTERUS N/A 08/14/2017   Procedure: DILATATION AND CURETTAGE;  Surgeon: Allie Bossier, MD;  Location: [facility];  Service: Gynecology;  Laterality: N/A;   REPLACEMENT TOTAL KNEE Right 2021    Social History   Socioeconomic History   Marital status: Married    Spouse name: Not on file   Number of children: 4   Years of education: Not on file   Highest education level: 12th grade  Occupational History   Not on file  Tobacco Use   Smoking status: Never   Smokeless tobacco: Never  Vaping Use   Vaping status: Never Used  Substance and Sexual Activity   Alcohol use: Not Currently   Drug use: Never   Sexual activity: Yes    Birth control/protection: Post-menopausal  Other Topics Concern   Not on file  Social History Narrative   Lives at home with wife and teenage daughter   Social Drivers of Health   Financial Resource Strain: Low Risk  (04/10/2023)   Overall Financial Resource Strain (CARDIA)    Difficulty of Paying Living Expenses: Not hard at all  Food Insecurity: No Food Insecurity (04/10/2023)   Hunger Vital Sign    Worried About Running Out of Food in the Last Year: Never true    Ran Out of Food in the Last Year: Never true  Transportation Needs: No Transportation Needs (04/10/2023)   PRAPARE - Administrator, Civil Service (Medical):  No    Lack of Transportation (Non-Medical): No  Physical Activity: Insufficiently Active (04/10/2023)   Exercise Vital Sign    Days of Exercise per Week: 4 days    Minutes of Exercise per Session: 20 min  Stress: No Stress Concern Present (04/10/2023)   [employer] of Occupational Health - Occupational Stress Questionnaire    Feeling of Stress : Only a little  Social Connections: Moderately Integrated (04/10/2023)   Social Connection and Isolation Panel [NHANES]    Frequency of Communication with Friends and Family: More than three times a week    Frequency of Social Gatherings with Friends and Family: More than three times a week    Attends Religious Services: More than 4 times per year    Active Member of [employer] or Organizations: No    Attends Banker Meetings: Not on file    Marital Status: Married  Intimate Partner Violence: Unknown (05/21/2021)   Received from [employer]   HITS    Physically Hurt: Not on file    Insult or Talk Down To: Not on file    Threaten Physical Harm: Not on file  Scream or Curse: Not on file    Outpatient Encounter Medications as of 04/13/2023  Medication Sig   acetaminophen (TYLENOL) 650 MG CR tablet 1 tablet by mouth twice daily with Celebrex   buPROPion (WELLBUTRIN XL) 150 MG 24 hr tablet Take 1 tablet (150 mg total) by mouth daily.   celecoxib (CELEBREX) 200 MG capsule Take 1 capsule (200 mg total) by mouth 2 (two) times daily.   pantoprazole (PROTONIX) 40 MG tablet TAKE 1 TABLET(40 MG) BY MOUTH DAILY   Vedolizumab (ENTYVIO IV) Inject into the vein.   [DISCONTINUED] atorvastatin (LIPITOR) 10 MG tablet Take 1 tablet (10 mg total) by mouth daily.   [DISCONTINUED] hydrochlorothiazide (HYDRODIURIL) 25 MG tablet Take 1 tablet (25 mg total) by mouth daily.   [DISCONTINUED] sertraline (ZOLOFT) 50 MG tablet TAKE 1 TABLET(50 MG) BY MOUTH AT BEDTIME   [DISCONTINUED] WEGOVY 2.4 MG/0.75ML SOAJ INJECT 1 SYRINGE (2.4 MG)   SUBCUTANEOUSLY ONCE A WEEK   atorvastatin (LIPITOR) 10 MG tablet Take 1 tablet (10 mg total) by mouth daily.   hydrochlorothiazide (HYDRODIURIL) 25 MG tablet Take 1 tablet (25 mg total) by mouth daily.   Semaglutide-Weight Management (WEGOVY) 2.4 MG/0.75ML SOAJ Inject 2.4 mg into the skin once a week.   sertraline (ZOLOFT) 50 MG tablet TAKE 1 TABLET(50 MG) BY MOUTH AT BEDTIME   [DISCONTINUED] estradiol (ESTRACE) 1 MG tablet Take 1 tablet (1 mg total) by mouth daily.   No facility-administered encounter medications on file as of 04/13/2023.    No Known Allergies  Review of Systems      Objective:  BP 125/84 (BP Location: Left Arm, Cuff Size: Normal)   Pulse 91   Resp 20   Ht 5\' 6"  (1.676 m)   Wt 197 lb (89.4 kg)   SpO2 97%   BMI 31.80 kg/m    Wt Readings from Last 3 Encounters:  04/13/23 197 lb (89.4 kg)  02/12/23 194 lb 3.2 oz (88.1 kg)  12/18/22 198 lb (89.8 kg)    Physical Exam  Results for orders placed or performed in visit on 10/14/22  Cytology - PAP   Collection Time: 10/14/22  9:07 AM  Result Value Ref Range   High risk HPV Negative    Adequacy      Satisfactory for evaluation; transformation zone component PRESENT.   Diagnosis      - Negative for intraepithelial lesion or malignancy (NILM)   Comment Normal Reference Range HPV - Negative        Pertinent labs & imaging results that were available during my care of the patient were reviewed by me and considered in my medical decision making.   Continue healthy lifestyle choices, including diet (rich in fruits, vegetables, and lean proteins, and low in salt and simple carbohydrates) and exercise (at least 30 minutes of moderate physical activity daily).   The above assessment and management plan was discussed with the patient. The patient verbalized understanding of and has agreed to the management plan. Patient is aware to call the clinic if they develop any new symptoms or if symptoms persist or worsen.  Patient is aware when to return to the clinic for a follow-up visit. Patient educated on when it is appropriate to go to the emergency department.   Maryelizabeth Kaufmann Student AGNP

## 2023-04-14 ENCOUNTER — Encounter: Payer: Self-pay | Admitting: Medical-Surgical

## 2023-04-14 ENCOUNTER — Ambulatory Visit: Payer: Managed Care, Other (non HMO)

## 2023-04-14 LAB — CBC WITH DIFFERENTIAL/PLATELET
Basophils Absolute: 0 10*3/uL (ref 0.0–0.2)
Basos: 1 %
EOS (ABSOLUTE): 0.1 10*3/uL (ref 0.0–0.4)
Eos: 2 %
Hematocrit: 36.6 % (ref 34.0–46.6)
Hemoglobin: 12.1 g/dL (ref 11.1–15.9)
Immature Grans (Abs): 0 10*3/uL (ref 0.0–0.1)
Immature Granulocytes: 0 %
Lymphocytes Absolute: 1 10*3/uL (ref 0.7–3.1)
Lymphs: 23 %
MCH: 27.9 pg (ref 26.6–33.0)
MCHC: 33.1 g/dL (ref 31.5–35.7)
MCV: 84 fL (ref 79–97)
Monocytes Absolute: 0.3 10*3/uL (ref 0.1–0.9)
Monocytes: 7 %
Neutrophils Absolute: 3 10*3/uL (ref 1.4–7.0)
Neutrophils: 67 %
Platelets: 200 10*3/uL (ref 150–450)
RBC: 4.34 x10E6/uL (ref 3.77–5.28)
RDW: 13.9 % (ref 11.7–15.4)
WBC: 4.4 10*3/uL (ref 3.4–10.8)

## 2023-04-14 LAB — LIPID PANEL
Chol/HDL Ratio: 2.2 {ratio} (ref 0.0–4.4)
Cholesterol, Total: 159 mg/dL (ref 100–199)
HDL: 72 mg/dL (ref >39)
LDL Chol Calc (NIH): 58 mg/dL (ref 0–99)
Triglycerides: 175 mg/dL — ABNORMAL HIGH (ref 0–149)
VLDL Cholesterol Cal: 29 mg/dL (ref 5–40)

## 2023-04-14 LAB — CMP14+EGFR
ALT: 16 [IU]/L (ref 0–32)
AST: 22 [IU]/L (ref 0–40)
Albumin: 4.3 g/dL (ref 3.9–4.9)
Alkaline Phosphatase: 93 [IU]/L (ref 44–121)
BUN/Creatinine Ratio: 17 (ref 12–28)
BUN: 13 mg/dL (ref 8–27)
Bilirubin Total: 0.6 mg/dL (ref 0.0–1.2)
CO2: 22 mmol/L (ref 20–29)
Calcium: 9.5 mg/dL (ref 8.7–10.3)
Chloride: 103 mmol/L (ref 96–106)
Creatinine, Ser: 0.76 mg/dL (ref 0.57–1.00)
Globulin, Total: 2.2 g/dL (ref 1.5–4.5)
Glucose: 88 mg/dL (ref 70–99)
Potassium: 5.2 mmol/L (ref 3.5–5.2)
Sodium: 143 mmol/L (ref 134–144)
Total Protein: 6.5 g/dL (ref 6.0–8.5)
eGFR: 88 mL/min/{1.73_m2} (ref >59)

## 2023-04-14 NOTE — Progress Notes (Signed)
 Medical screening examination/treatment was performed by qualified clinical staff member and as supervising provider I was immediately available for consultation/collaboration. I have reviewed documentation and agree with assessment and plan.  Thayer Ohm, DNP, APRN, FNP-BC [facility]

## 2023-04-21 ENCOUNTER — Telehealth: Payer: Self-pay | Admitting: Pharmacy Technician

## 2023-04-21 NOTE — Telephone Encounter (Signed)
 Auth Submission: APPROVED Site of care: Site of care: [facility] Payer: [employer] Medication & CPT/J Code(s) submitted: Entyvio (Vedolizumab) [id number] Route of submission (phone, fax, portal):  Phone # Fax # Auth type: Buy/Bill PB Units/visits requested: 300MG  Q8WKS Reference number: [id number] Approval from: 04/14/23 to 04/13/24

## 2023-05-04 ENCOUNTER — Ambulatory Visit (INDEPENDENT_AMBULATORY_CARE_PROVIDER_SITE_OTHER)

## 2023-05-04 VITALS — BP 145/89 | HR 70 | Temp 98.6°F | Resp 18 | Ht 63.0 in | Wt 194.4 lb

## 2023-05-04 DIAGNOSIS — K50018 Crohn's disease of small intestine with other complication: Secondary | ICD-10-CM | POA: Diagnosis not present

## 2023-05-04 MED ORDER — VEDOLIZUMAB 300 MG IV SOLR
300.0000 mg | Freq: Once | INTRAVENOUS | Status: AC
  Administered 2023-05-04: 300 mg via INTRAVENOUS
  Filled 2023-05-04: qty 5

## 2023-05-04 NOTE — Progress Notes (Signed)
 Diagnosis: Crohn's Disease  Provider:  Chilton Greathouse MD  Procedure: IV Infusion  IV Type: Peripheral, IV Location: L Forearm  Entyvio (Vedolizumab), Dose: 300 mg  Infusion Start Time: 0911  Infusion Stop Time: 0947  Post Infusion IV Care: Peripheral IV Discontinued  Discharge: Condition: Good, Destination: Home . AVS Declined  Performed by:  Garnette Czech, RN

## 2023-05-29 ENCOUNTER — Other Ambulatory Visit: Payer: Self-pay | Admitting: Radiology

## 2023-06-10 ENCOUNTER — Other Ambulatory Visit: Payer: Self-pay | Admitting: Medical-Surgical

## 2023-06-10 DIAGNOSIS — E66811 Obesity, class 1: Secondary | ICD-10-CM

## 2023-06-12 ENCOUNTER — Encounter: Payer: Self-pay | Admitting: Medical-Surgical

## 2023-06-12 DIAGNOSIS — E6609 Other obesity due to excess calories: Secondary | ICD-10-CM

## 2023-06-12 MED ORDER — WEGOVY 2.4 MG/0.75ML ~~LOC~~ SOAJ: 2.4000 mg | SUBCUTANEOUS | 3 refills | Status: DC

## 2023-06-15 ENCOUNTER — Telehealth: Payer: Self-pay

## 2023-06-15 ENCOUNTER — Encounter: Payer: Self-pay | Admitting: Gastroenterology

## 2023-06-15 ENCOUNTER — Other Ambulatory Visit (HOSPITAL_COMMUNITY): Payer: Self-pay

## 2023-06-15 NOTE — Telephone Encounter (Signed)
 Pharmacy Patient Advocate Encounter   Received notification from Patient Pharmacy that prior authorization for Wegovy  2.4 is required/requested.   Insurance verification completed.   The patient is insured through [facility] .   Per test claim: PA required; PA submitted to above mentioned insurance via CoverMyMeds Key/confirmation #/EOC [id number] Status is pending

## 2023-06-16 ENCOUNTER — Other Ambulatory Visit (HOSPITAL_BASED_OUTPATIENT_CLINIC_OR_DEPARTMENT_OTHER): Payer: Self-pay | Admitting: Medical-Surgical

## 2023-06-16 DIAGNOSIS — Z1231 Encounter for screening mammogram for malignant neoplasm of breast: Secondary | ICD-10-CM

## 2023-06-17 ENCOUNTER — Other Ambulatory Visit (HOSPITAL_COMMUNITY): Payer: Self-pay

## 2023-06-17 ENCOUNTER — Encounter: Payer: Self-pay | Admitting: Gastroenterology

## 2023-06-18 ENCOUNTER — Encounter: Payer: Self-pay | Admitting: Gastroenterology

## 2023-06-18 ENCOUNTER — Other Ambulatory Visit (HOSPITAL_COMMUNITY): Payer: Self-pay

## 2023-06-18 NOTE — Telephone Encounter (Signed)
 Pharmacy Patient Advocate Encounter   Received notification from CoverMyMeds that prior authorization for Wegovy  is required/requested.   Insurance verification completed.   The patient is insured through [facility] ADVANTAGE/RX ADVANCE .   Per test claim: PA required; PA submitted to above mentioned insurance via Prompt PA Key/confirmation #/EOC [id number] Status is pending

## 2023-06-19 ENCOUNTER — Ambulatory Visit (INDEPENDENT_AMBULATORY_CARE_PROVIDER_SITE_OTHER): Admitting: Sports Medicine

## 2023-06-19 ENCOUNTER — Other Ambulatory Visit (INDEPENDENT_AMBULATORY_CARE_PROVIDER_SITE_OTHER)

## 2023-06-19 ENCOUNTER — Other Ambulatory Visit: Payer: Self-pay | Admitting: Medical-Surgical

## 2023-06-19 ENCOUNTER — Encounter: Payer: Self-pay | Admitting: Sports Medicine

## 2023-06-19 DIAGNOSIS — M1611 Unilateral primary osteoarthritis, right hip: Secondary | ICD-10-CM | POA: Diagnosis not present

## 2023-06-19 DIAGNOSIS — E66811 Obesity, class 1: Secondary | ICD-10-CM

## 2023-06-19 MED ORDER — TRIAMCINOLONE ACETONIDE 40 MG/ML IJ SUSP
40.0000 mg | Freq: Once | INTRAMUSCULAR | Status: AC
  Administered 2023-06-19: 40 mg via INTRAMUSCULAR

## 2023-06-19 NOTE — Addendum Note (Signed)
 Addended by: OLIVA-AVELLANEDA, Eulalio Reamy L on: 06/19/2023 11:05 AM   Modules accepted: Orders

## 2023-06-19 NOTE — Progress Notes (Signed)
    Procedures performed today:    Procedure: Real-time Ultrasound Guided injection of the right hip joint Device: Samsung HS60  Verbal informed consent obtained.  Time-out conducted.  Noted no overlying erythema, induration, or other signs of local infection.  Skin prepped in a sterile fashion.  Local anesthesia: Topical Ethyl chloride.  With sterile technique and under real time ultrasound guidance: Arthritic joint noted, 1 cc Kenalog  40, 2 cc lidocaine , 2 cc bupivacaine  injected easily Completed without difficulty  Advised to call if fevers/chills, erythema, induration, drainage, or persistent bleeding.  Images permanently stored and available for review in PACS.  Impression: Technically successful ultrasound guided injection.  Independent interpretation of notes and tests performed by another provider:   None.  Brief History, Exam, Impression, and Recommendations:    Primary osteoarthritis of right hip Known right hip osteoarthritis, initially controlled with Celebrex  twice daily, Tylenol  and home PT, now having increasing pain, reproducible with internal rotation and referral to the groin. We injected her hip joint today under ultrasound guidance, she walked out pain-free, return to see me 6 weeks.    ____________________________________________ Joselyn Nicely. Sandy Crumb, M.D., ABFM., CAQSM., AME. Primary Care and Sports Medicine [facility]  Adjunct Professor of [facility] Medicine  [employer]  Restaurant manager, fast food

## 2023-06-19 NOTE — Assessment & Plan Note (Signed)
 Known right hip osteoarthritis, initially controlled with Celebrex  twice daily, Tylenol  and home PT, now having increasing pain, reproducible with internal rotation and referral to the groin. We injected her hip joint today under ultrasound guidance, she walked out pain-free, return to see me 6 weeks.

## 2023-06-24 ENCOUNTER — Ambulatory Visit

## 2023-06-24 DIAGNOSIS — Z1231 Encounter for screening mammogram for malignant neoplasm of breast: Secondary | ICD-10-CM

## 2023-06-24 IMAGING — DX DG SHOULDER 2+V*R*
3 series · 3 of 3 positions shown · non-contrast
Comparison: None.

CLINICAL DATA: Acute right shoulder pain after lifting heavy boxes
3 weeks ago.

EXAM:
RIGHT SHOULDER - 2+ VIEW

[shoulder grashey]
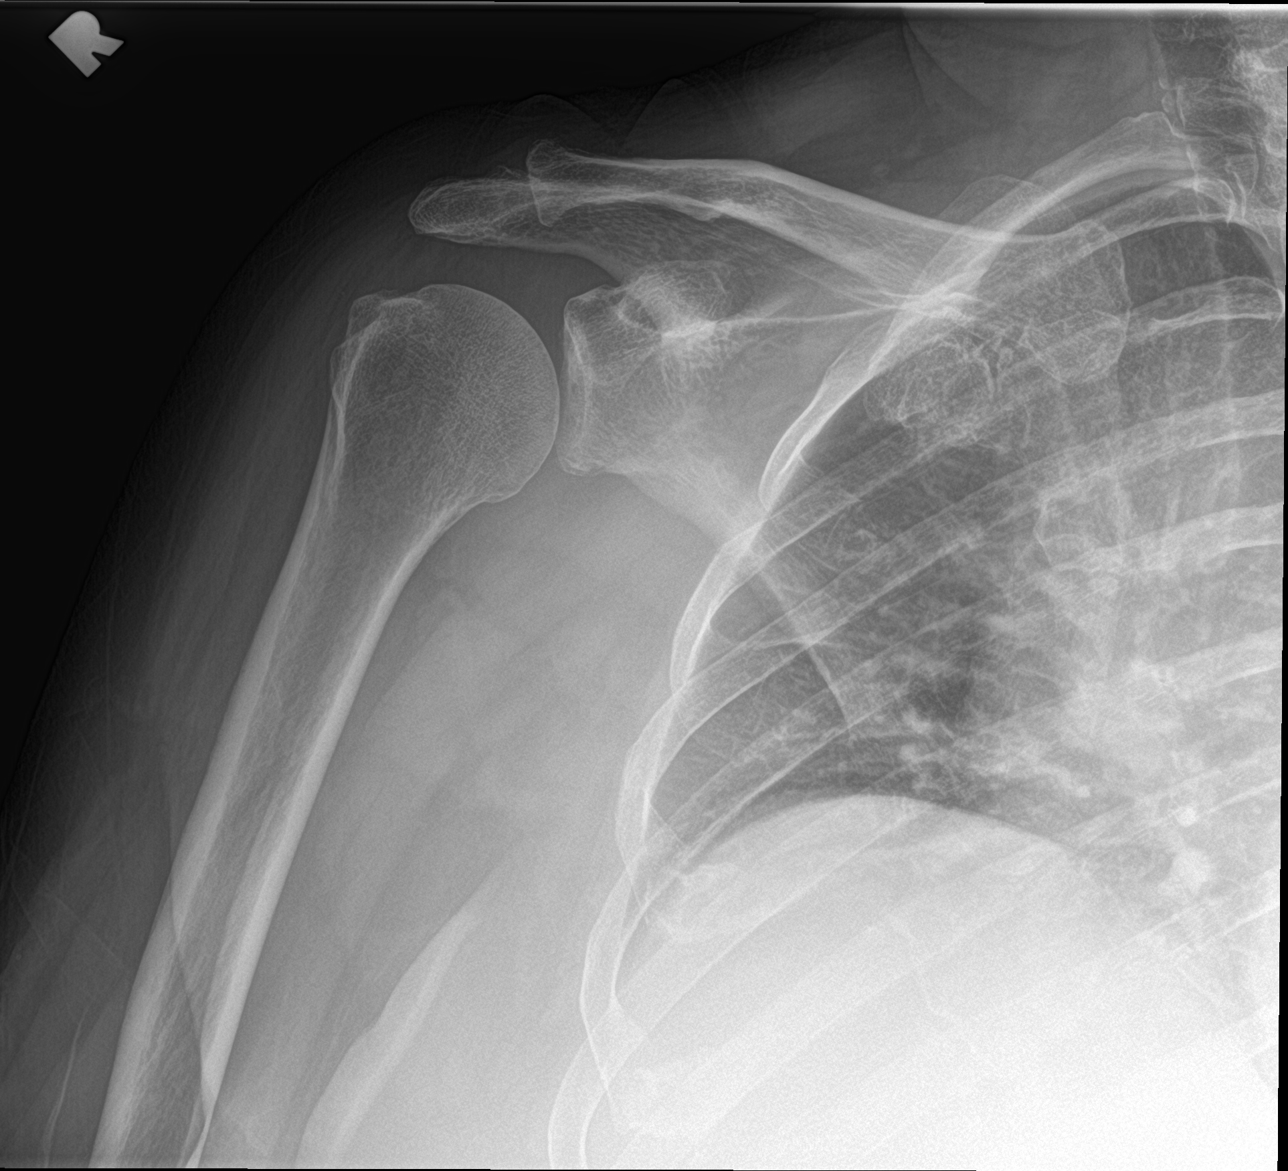

[shoulder y view]
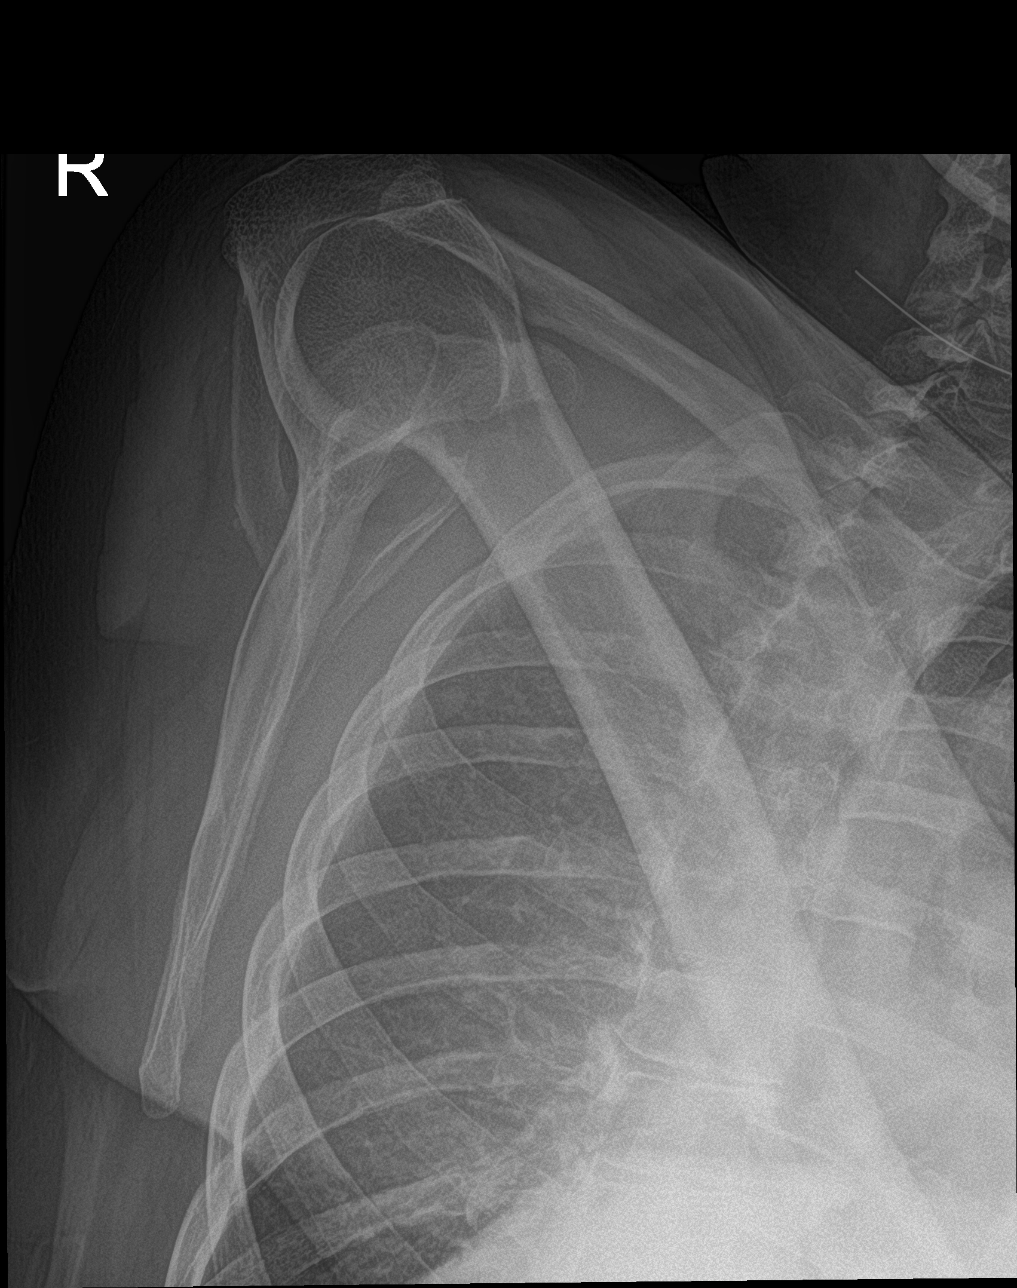

[shoulder axillary]
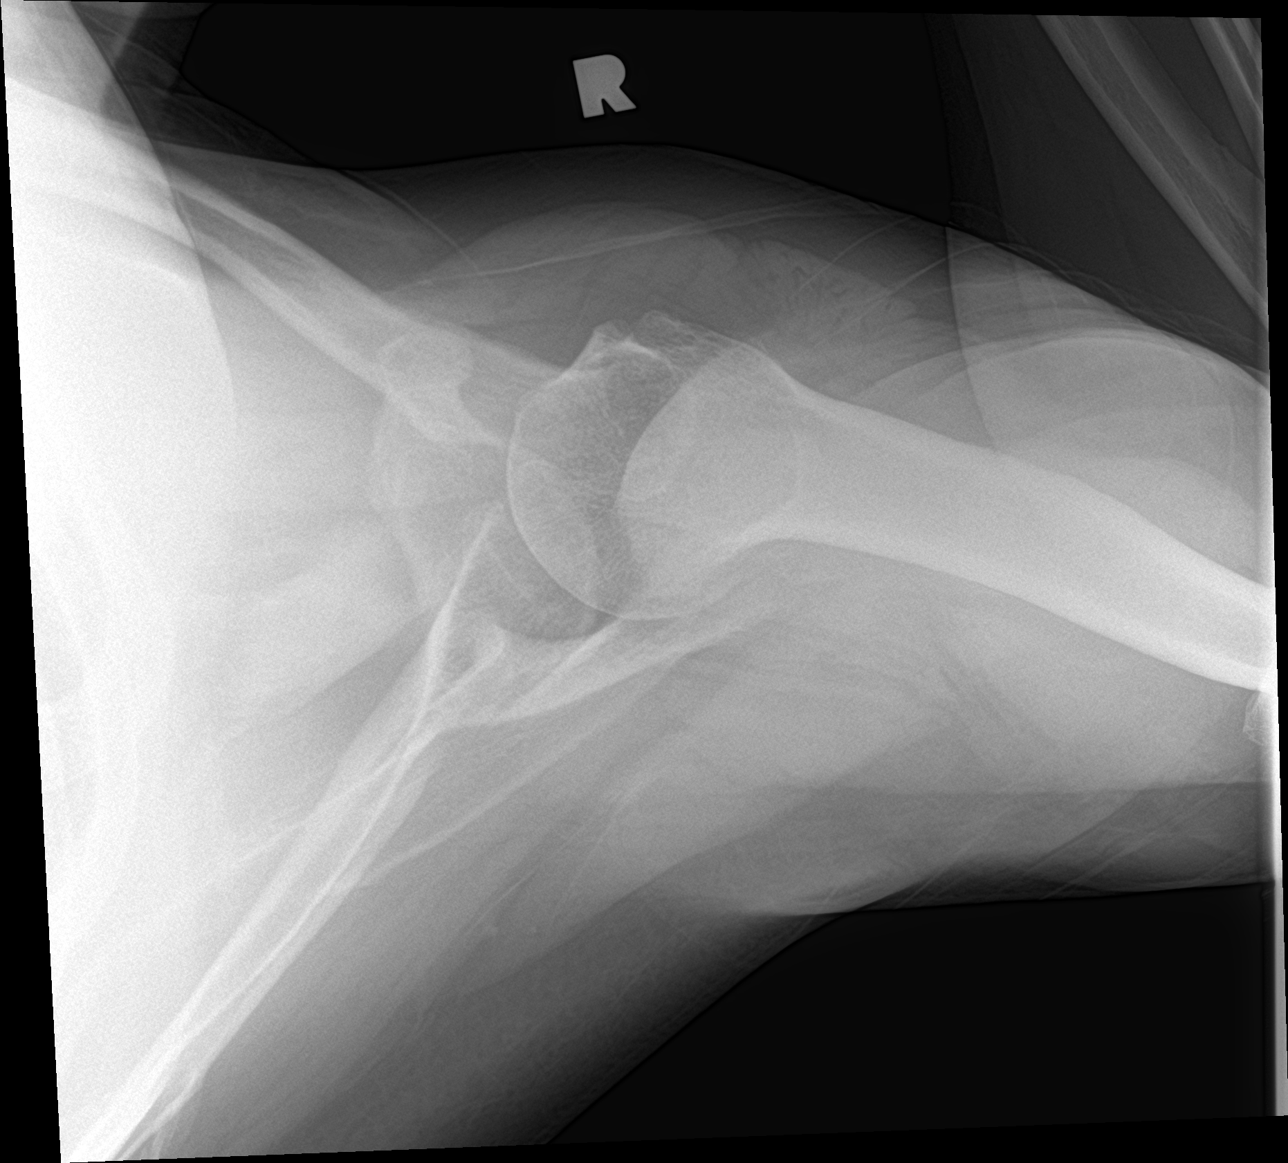

[3 of 3 positions shown; findings below may reference images not displayed]

FINDINGS: There is no evidence of fracture or dislocation. There is no
evidence of arthropathy or other focal bone abnormality. Soft
tissues are unremarkable.
IMPRESSION: Negative.

## 2023-06-25 ENCOUNTER — Encounter: Payer: Self-pay | Admitting: Medical-Surgical

## 2023-06-25 NOTE — Telephone Encounter (Signed)
 Additional information has been requested from the patient's insurance in order to proceed with the prior authorization request. Requested information has been sent, or form has been filled out and faxed back to [phone]

## 2023-06-26 MED ORDER — WEGOVY 2.4 MG/0.75ML ~~LOC~~ SOAJ
2.4000 mg | SUBCUTANEOUS | 3 refills | Status: AC
Start: 2023-06-26 — End: ?

## 2023-06-26 NOTE — Telephone Encounter (Signed)
 Pharmacy Patient Advocate Encounter  Received notification from [facility] that Prior Authorization for WEGOVY  2.4MG /0.75ML  has been APPROVED from 06/25/2023 to 06/23/2024.   PA #/Case ID/Reference #: [id number]

## 2023-06-30 ENCOUNTER — Other Ambulatory Visit (HOSPITAL_COMMUNITY): Payer: Self-pay

## 2023-07-01 ENCOUNTER — Other Ambulatory Visit: Payer: Self-pay | Admitting: Gastroenterology

## 2023-07-01 DIAGNOSIS — K219 Gastro-esophageal reflux disease without esophagitis: Secondary | ICD-10-CM

## 2023-07-03 ENCOUNTER — Ambulatory Visit

## 2023-07-03 VITALS — BP 121/84 | HR 76 | Temp 98.2°F | Resp 18 | Ht 63.0 in | Wt 194.2 lb

## 2023-07-03 DIAGNOSIS — K50018 Crohn's disease of small intestine with other complication: Secondary | ICD-10-CM

## 2023-07-03 MED ORDER — VEDOLIZUMAB 300 MG IV SOLR
300.0000 mg | Freq: Once | INTRAVENOUS | Status: AC
  Administered 2023-07-03: 300 mg via INTRAVENOUS
  Filled 2023-07-03: qty 5

## 2023-07-03 NOTE — Progress Notes (Signed)
 Diagnosis: Crohn's Disease  Provider:  Praveen Mannam MD  Procedure: IV Infusion  IV Type: Peripheral, IV Location: LWrist   Entyvio  (Vedolizumab ), Dose: 300 mg  Infusion Start Time: 0842  Infusion Stop Time: 0916  Post Infusion IV Care: Peripheral IV Discontinued  Discharge: Condition: Good, Destination: Home . AVS Declined  Performed by:  Star East, LPN

## 2023-07-07 ENCOUNTER — Other Ambulatory Visit: Payer: Self-pay | Admitting: Medical-Surgical

## 2023-07-09 ENCOUNTER — Other Ambulatory Visit (INDEPENDENT_AMBULATORY_CARE_PROVIDER_SITE_OTHER)

## 2023-07-09 ENCOUNTER — Ambulatory Visit (INDEPENDENT_AMBULATORY_CARE_PROVIDER_SITE_OTHER): Admitting: Sports Medicine

## 2023-07-09 DIAGNOSIS — M7542 Impingement syndrome of left shoulder: Secondary | ICD-10-CM | POA: Insufficient documentation

## 2023-07-09 MED ORDER — TRIAMCINOLONE ACETONIDE 40 MG/ML IJ SUSP
40.0000 mg | Freq: Once | INTRAMUSCULAR | Status: AC
  Administered 2023-07-09: 40 mg via INTRA_ARTICULAR

## 2023-07-09 NOTE — Progress Notes (Signed)
    Procedures performed today:    Procedure: Real-time Ultrasound Guided injection of the left subacromial bursa Device: Samsung HS60  Verbal informed consent obtained.  Time-out conducted.  Noted no overlying erythema, induration, or other signs of local infection.  Skin prepped in a sterile fashion.  Local anesthesia: Topical Ethyl chloride.  With sterile technique and under real time ultrasound guidance: Intact cuff noted, 1 cc Kenalog  40, 1 cc lidocaine , 1 cc bupivacaine  injected easily Completed without difficulty  Advised to call if fevers/chills, erythema, induration, drainage, or persistent bleeding.  Images permanently stored and available for review in PACS.  Impression: Technically successful ultrasound guided injection.  Independent interpretation of notes and tests performed by another provider:   None.  Brief History, Exam, Impression, and Recommendations:    Impingement syndrome, shoulder, left This is a very pleasant 63 year old female, increasing pain in left shoulder for the last 2 weeks, localized over the deltoid, worse with abduction, positive impingement signs on exam without much weakness, patient is desiring aggressive interventional treatment today so we did a subacromial injection, she will get x-rays, formal PT, return to see me in 6 weeks.    ____________________________________________ Joselyn Nicely. Sandy Crumb, M.D., ABFM., CAQSM., AME. Primary Care and Sports Medicine [facility]  Adjunct Professor of [facility] Medicine  [employer]  Restaurant manager, fast food

## 2023-07-09 NOTE — Assessment & Plan Note (Addendum)
 This is a very pleasant 63 year old female, increasing pain in left shoulder for the last 2 weeks, localized over the deltoid, worse with abduction, positive impingement signs on exam without much weakness, patient is desiring aggressive interventional treatment today so we did a subacromial injection, she will get x-rays, home PT, return to see me in 6 weeks.

## 2023-07-09 NOTE — Addendum Note (Signed)
 Addended by: Montgomery Apgar on: 07/09/2023 10:21 AM   Modules accepted: Orders

## 2023-07-20 ENCOUNTER — Encounter: Payer: Self-pay | Admitting: Sports Medicine

## 2023-07-21 ENCOUNTER — Ambulatory Visit

## 2023-07-21 DIAGNOSIS — M7542 Impingement syndrome of left shoulder: Secondary | ICD-10-CM | POA: Diagnosis not present

## 2023-08-03 ENCOUNTER — Telehealth: Payer: Self-pay

## 2023-08-03 NOTE — Telephone Encounter (Signed)
 Attempted call to patient. Left a voice mail message requesting a return call.

## 2023-08-03 NOTE — Telephone Encounter (Signed)
 Copied from CRM [id number]. Topic: Appointments - Appointment Scheduling >> Aug 03, 2023 11:05 AM [name] H wrote: Patient is wanting to speak to Dr. Elva Hamburger nurse about her appointment on 06/17 please assist, patients callback number is [phone].

## 2023-08-03 NOTE — Telephone Encounter (Signed)
 Spoke with patient who reports doing great after the injection into the hip and continues to do the exercises. She wants to cancel her appt for tomorrow and will call back if problems arise.

## 2023-08-03 NOTE — Telephone Encounter (Signed)
 Patient states she was returning  a call to Dr. Ernestine Heads nurse - she had called to see reason for appt schld for 08/04/2023 at 8:15am She states this was scheduled for follow up from hip.  She states she had her shot and is doing the recommended exercises. She is no longer in pain - she wants to know if she should keep appt scheduled for tomorrow at 8:15am - requesting a call back today .

## 2023-08-04 ENCOUNTER — Ambulatory Visit: Admitting: Sports Medicine

## 2023-08-20 ENCOUNTER — Ambulatory Visit (INDEPENDENT_AMBULATORY_CARE_PROVIDER_SITE_OTHER): Admitting: Sports Medicine

## 2023-08-20 DIAGNOSIS — M7542 Impingement syndrome of left shoulder: Secondary | ICD-10-CM

## 2023-08-20 MED ORDER — TRIAZOLAM 0.25 MG PO TABS: ORAL_TABLET | ORAL | 0 refills | Status: DC

## 2023-08-20 MED ORDER — TRAMADOL HCL 50 MG PO TABS: 50.0000 mg | ORAL_TABLET | Freq: Three times a day (TID) | ORAL | 0 refills | Status: DC | PRN

## 2023-08-20 NOTE — Assessment & Plan Note (Signed)
 Pleasant 63 year old female returns, chronic left shoulder pain, impingement signs on exam, we injected her subacromial bursa and had her do some home PT, unfortunately she has had no relief not even temporary, she does have some weakness to abduction, question supraspinatus tearing, adding an MRI, tramadol , triazolam for preprocedural anxiolysis, I do suspect she will need surgical consultation.  Follow-up depends on MRI results though we will likely do a referral to Dr. Cristy.

## 2023-08-20 NOTE — Progress Notes (Signed)
    Procedures performed today:    None.  Independent interpretation of notes and tests performed by another provider:   None.  Brief History, Exam, Impression, and Recommendations:    Impingement syndrome, shoulder, left Pleasant 63 year old female returns, chronic left shoulder pain, impingement signs on exam, we injected her subacromial bursa and had her do some home PT, unfortunately she has had no relief not even temporary, she does have some weakness to abduction, question supraspinatus tearing, adding an MRI, tramadol , triazolam for preprocedural anxiolysis, I do suspect she will need surgical consultation.  Follow-up depends on MRI results though we will likely do a referral to Dr. Cristy.    ____________________________________________ Debby PARAS. Curtis, M.D., ABFM., CAQSM., AME. Primary Care and Sports Medicine [facility]  Adjunct Professor of [facility] Medicine  [employer] Medicine  Restaurant manager, fast food

## 2023-08-27 ENCOUNTER — Telehealth: Payer: Self-pay

## 2023-08-27 DIAGNOSIS — M7542 Impingement syndrome of left shoulder: Secondary | ICD-10-CM

## 2023-08-27 NOTE — Telephone Encounter (Signed)
 Copied from CRM [id number]. Topic: General - Other >> Aug 27, 2023 12:26 PM Kevelyn M wrote: Reason for CRM: MRI order needed without contrast of the left shoulder for [facility] on [address]. Fax #[phone].

## 2023-08-28 ENCOUNTER — Ambulatory Visit (INDEPENDENT_AMBULATORY_CARE_PROVIDER_SITE_OTHER)

## 2023-08-28 VITALS — BP 115/86 | HR 83 | Temp 97.4°F | Resp 18 | Ht 63.0 in | Wt 194.0 lb

## 2023-08-28 DIAGNOSIS — K50018 Crohn's disease of small intestine with other complication: Secondary | ICD-10-CM | POA: Diagnosis not present

## 2023-08-28 MED ORDER — VEDOLIZUMAB 300 MG IV SOLR
300.0000 mg | Freq: Once | INTRAVENOUS | Status: AC
  Administered 2023-08-28: 300 mg via INTRAVENOUS
  Filled 2023-08-28: qty 5

## 2023-08-28 NOTE — Progress Notes (Signed)
 Diagnosis: Crohn's Disease  Provider:  Praveen Mannam MD  Procedure: IV Infusion  IV Type: Peripheral, IV Location: L Wrist   Entyvio  (Vedolizumab ), Dose: 300 mg  Infusion Start Time: 0825  Infusion Stop Time: 0900  Post Infusion IV Care: Peripheral IV Discontinued  Discharge: Condition: Good, Destination: Home . AVS Declined  Performed by:  Leita FORBES Miles, LPN

## 2023-09-26 ENCOUNTER — Other Ambulatory Visit: Payer: Self-pay | Admitting: Gastroenterology

## 2023-09-26 DIAGNOSIS — K219 Gastro-esophageal reflux disease without esophagitis: Secondary | ICD-10-CM

## 2023-10-05 ENCOUNTER — Other Ambulatory Visit: Payer: Self-pay | Admitting: Medical-Surgical

## 2023-10-06 ENCOUNTER — Encounter: Payer: Self-pay | Admitting: Medical-Surgical

## 2023-10-07 ENCOUNTER — Other Ambulatory Visit: Payer: Self-pay

## 2023-10-07 MED ORDER — BUPROPION HCL ER (XL) 150 MG PO TB24: 150.0000 mg | ORAL_TABLET | Freq: Every day | ORAL | 0 refills | Status: DC

## 2023-10-07 MED ORDER — BUPROPION HCL ER (XL) 150 MG PO TB24: 150.0000 mg | ORAL_TABLET | Freq: Every day | ORAL | 1 refills | Status: DC

## 2023-10-07 NOTE — Telephone Encounter (Signed)
 Spoke with patient. She is scheduled for f/u appt with Megan [name] for 10/12/2023. Requesting to send 90 day supply  and will keep upcoming appt  .

## 2023-10-12 ENCOUNTER — Encounter: Payer: Self-pay | Admitting: Medical-Surgical

## 2023-10-12 ENCOUNTER — Ambulatory Visit (INDEPENDENT_AMBULATORY_CARE_PROVIDER_SITE_OTHER): Payer: Managed Care, Other (non HMO) | Admitting: Medical-Surgical

## 2023-10-12 ENCOUNTER — Ambulatory Visit (INDEPENDENT_AMBULATORY_CARE_PROVIDER_SITE_OTHER)

## 2023-10-12 VITALS — BP 118/77 | HR 90 | Resp 20 | Ht 63.0 in | Wt 196.0 lb

## 2023-10-12 DIAGNOSIS — F32A Depression, unspecified: Secondary | ICD-10-CM

## 2023-10-12 DIAGNOSIS — M79651 Pain in right thigh: Secondary | ICD-10-CM | POA: Diagnosis not present

## 2023-10-12 DIAGNOSIS — I10 Essential (primary) hypertension: Secondary | ICD-10-CM

## 2023-10-12 DIAGNOSIS — K219 Gastro-esophageal reflux disease without esophagitis: Secondary | ICD-10-CM

## 2023-10-12 DIAGNOSIS — K449 Diaphragmatic hernia without obstruction or gangrene: Secondary | ICD-10-CM | POA: Diagnosis not present

## 2023-10-12 DIAGNOSIS — F419 Anxiety disorder, unspecified: Secondary | ICD-10-CM | POA: Diagnosis not present

## 2023-10-12 DIAGNOSIS — R7303 Prediabetes: Secondary | ICD-10-CM

## 2023-10-12 DIAGNOSIS — M79652 Pain in left thigh: Secondary | ICD-10-CM

## 2023-10-12 MED ORDER — METHOCARBAMOL 500 MG PO TABS: 500.0000 mg | ORAL_TABLET | Freq: Three times a day (TID) | ORAL | 0 refills | Status: DC

## 2023-10-12 MED ORDER — PREDNISONE 50 MG PO TABS: 50.0000 mg | ORAL_TABLET | Freq: Every day | ORAL | 0 refills | Status: DC

## 2023-10-12 MED ORDER — BUPROPION HCL ER (XL) 150 MG PO TB24
150.0000 mg | ORAL_TABLET | Freq: Every day | ORAL | 1 refills | Status: AC
Start: 2023-10-12 — End: ?

## 2023-10-12 NOTE — Progress Notes (Signed)
        Established patient visit   History of Present Illness   Discussed the use of AI scribe software for clinical note transcription with the patient, who gave verbal consent to proceed.  History of Present Illness   Megan [name] is a 63 year old female with arthritis who presents with bilateral leg pain.  Bilateral lower extremity pain - Aching and throbbing pain in both legs, extending from the hip line down past the knees - Pain severity sufficient to disrupt sleep - Pain worsens with activity - Occasional tingling in the legs - Able to sit, but experiences difficulty walking upon standing - Thigh pain sometimes described as a groin pull, located on the inner thighs - No recent falls or injuries - History of arthritis in the knees, confirmed by previous x-rays - Burning pain in the back at times, but not in the thighs or legs - No imaging of the lower back to date  Analgesic and adjunctive medication response - Current medications include Wellbutrin  and sertraline , both remain effective - Previously trialed Celebrex  and Tylenol  for arthritis pain without adequate relief - Tramadol  previously trialed but not effective - Muscle relaxers previously trialed but caused drowsiness      Physical Exam   Physical Exam Vitals reviewed.  Constitutional:      General: She is not in acute distress.    Appearance: Normal appearance. She is obese. She is not ill-appearing.  HENT:     Head: Normocephalic and atraumatic.  Cardiovascular:     Rate and Rhythm: Normal rate and regular rhythm.     Pulses: Normal pulses.     Heart sounds: Normal heart sounds. No murmur heard.    No friction rub. No gallop.  Pulmonary:     Effort: Pulmonary effort is normal. No respiratory distress.     Breath sounds: Normal breath sounds. No wheezing.  Skin:    General: Skin is warm and dry.  Neurological:     Mental Status: She is alert and oriented to person, place, and time.   Psychiatric:        Mood and Affect: Mood normal.        Behavior: Behavior normal.        Thought Content: Thought content normal.        Judgment: Judgment normal.    Assessment & Plan   Assessment and Plan    Chronic bilateral thigh pain and weakness Chronic thigh pain and weakness, likely related to low back or nerve issue. - Order lumbar spine x-ray to evaluate for bone spurs or nerve impingement. - Prescribe methocarbamol  for pain relief. - Prescribe 5-day prednisone  course, discussed potential sleep disturbances.  Chronic low back pain Chronic low back pain with occasional burning sensation. - Order lumbar spine x-ray to evaluate for bone spurs or nerve impingement.  Major depressive disorder Managed with Wellbutrin  and sertraline , Wellbutrin  not aiding weight loss. - Continue Wellbutrin  and sertraline .      Follow up   Return in about 6 months (around 04/13/2024) for chronic disease follow up. __________________________________ Zada FREDRIK Palin, DNP, APRN, FNP-BC Primary Care and Sports Medicine [facility] [place]

## 2023-10-15 ENCOUNTER — Encounter: Payer: Self-pay | Admitting: Medical-Surgical

## 2023-10-19 ENCOUNTER — Encounter: Payer: Self-pay | Admitting: Medical-Surgical

## 2023-10-19 DIAGNOSIS — M1611 Unilateral primary osteoarthritis, right hip: Secondary | ICD-10-CM

## 2023-10-20 ENCOUNTER — Encounter: Payer: Self-pay | Admitting: Sports Medicine

## 2023-10-23 ENCOUNTER — Ambulatory Visit

## 2023-10-23 VITALS — BP 122/83 | HR 84 | Temp 98.0°F | Resp 14 | Ht 63.0 in | Wt 195.6 lb

## 2023-10-23 DIAGNOSIS — K50018 Crohn's disease of small intestine with other complication: Secondary | ICD-10-CM

## 2023-10-23 MED ORDER — VEDOLIZUMAB 300 MG IV SOLR
300.0000 mg | Freq: Once | INTRAVENOUS | Status: AC
  Administered 2023-10-23: 300 mg via INTRAVENOUS
  Filled 2023-10-23: qty 5

## 2023-10-23 NOTE — Progress Notes (Signed)
 Diagnosis: Crohn's Disease  Provider:  Praveen Mannam MD  Procedure: IV Infusion  IV Type: Peripheral, IV Location: L wrist  Entyvio  (Vedolizumab ), Dose: 300 mg  Infusion Start Time: 0827  Infusion Stop Time: 0902  Post Infusion IV Care: Peripheral IV Discontinued  Discharge: Condition: Good, Destination: Home . AVS Declined  Performed by:  Eleanor DELENA Bloch, RN

## 2023-10-25 ENCOUNTER — Other Ambulatory Visit: Payer: Self-pay | Admitting: Gastroenterology

## 2023-10-25 ENCOUNTER — Ambulatory Visit: Payer: Self-pay | Admitting: Medical-Surgical

## 2023-10-25 DIAGNOSIS — K219 Gastro-esophageal reflux disease without esophagitis: Secondary | ICD-10-CM

## 2023-10-26 ENCOUNTER — Ambulatory Visit

## 2023-10-26 ENCOUNTER — Telehealth: Payer: Self-pay | Admitting: Gastroenterology

## 2023-10-26 ENCOUNTER — Other Ambulatory Visit: Payer: Self-pay | Admitting: Gastroenterology

## 2023-10-26 ENCOUNTER — Encounter: Payer: Self-pay | Admitting: Sports Medicine

## 2023-10-26 ENCOUNTER — Ambulatory Visit (INDEPENDENT_AMBULATORY_CARE_PROVIDER_SITE_OTHER): Admitting: Sports Medicine

## 2023-10-26 VITALS — BP 120/84 | Ht 63.0 in | Wt 195.0 lb

## 2023-10-26 DIAGNOSIS — M25551 Pain in right hip: Secondary | ICD-10-CM

## 2023-10-26 DIAGNOSIS — K219 Gastro-esophageal reflux disease without esophagitis: Secondary | ICD-10-CM

## 2023-10-26 DIAGNOSIS — M25552 Pain in left hip: Secondary | ICD-10-CM

## 2023-10-26 MED ORDER — PANTOPRAZOLE SODIUM 40 MG PO TBEC: 40.0000 mg | DELAYED_RELEASE_TABLET | Freq: Every day | ORAL | 1 refills | Status: DC

## 2023-10-26 NOTE — Telephone Encounter (Signed)
 Done

## 2023-10-26 NOTE — Progress Notes (Signed)
   Subjective:    Patient ID: Megan [name], female    DOB: 04/15/1960, 63 y.o.   MRN: [id number]  HPI chief complaint: Right hip pain  Megan [name] is a very pleasant 63 year old female who presents today with bilateral hip pain, right greater than left.  She has a documented history of mild right hip osteoarthritis.  She has received several intra-articular cortisone injections.  X-rays of her lumbar spine done recently showed some lumbar degenerative disc disease.  She localizes her pain to the lateral aspect of both hips.  She denies groin pain.  She is starting to limp.  She takes Tylenol  as well as Celebrex  but she is unsure whether or not they help.  She has found heat to be helpful.    Review of Systems As above    Objective:   Physical Exam  Well-developed, well-nourished.  No acute distress  Right hip: Limited internal rotation passively.  This reproduces lateral hip pain but not groin pain.  She is tender to palpation diffusely along the lateral hip.  Moderate hip weakness noted.  Neurovascular intact distally.      Assessment & Plan:   Bilateral hip pain, right greater than left, secondary to greater trochanteric pain syndrome  Her location of pain and physical exam findings suggest greater trochanteric pain syndrome as the source of her pain more so than osteoarthritis.  I recommended a trial of over-the-counter lidocaine  patches coupled with formal physical [facility] at [facility].  Follow-up in 4 weeks for reevaluation.  This note was dictated using Dragon naturally speaking software and may contain errors in syntax, spelling, or content which have not been identified prior to signing this note.

## 2023-10-26 NOTE — Telephone Encounter (Signed)
 Inbound call from patient requesting a refill on her Pantoprazole . Patient was schedule for October the 29 th. With Alan Coombs. Please advise.

## 2023-10-27 MED ORDER — DICLOFENAC SODIUM 75 MG PO TBEC: 75.0000 mg | DELAYED_RELEASE_TABLET | Freq: Two times a day (BID) | ORAL | 2 refills | Status: DC

## 2023-10-27 NOTE — Addendum Note (Signed)
 Addended byBETHA WILLO MINI on: 10/27/2023 08:12 AM   Modules accepted: Orders

## 2023-11-23 ENCOUNTER — Ambulatory Visit

## 2023-12-15 NOTE — Progress Notes (Unsigned)
 12/16/2023 Megan [name] [id number] 03-Sep-1960  Referring provider: Willo Mini, NP Primary GI doctor: Dr. Charlanne  ASSESSMENT AND PLAN:   Ileal Crohn's disease with stricture Colonoscopy 2022 at time of diagnosis terminal ileal stricture  Developed antibodies on Humira  has been on Entyvio  since 03/2021 Last Entyvio  infusion 10/23/2023 patient scheduled 12/18/2023 3 months ago started with AB pain, constipation for several days with then diarrhea, no hematochezia, she is passing gas but will have nausea/vomiting if she does not have a BM, no distension.  No fever, chills, weight loss. She has been on wegovy  x 1-2 years She has had myalgias/joint pain, no rashes  Some concern for partial obstruction with constipation/diarrhea/nausea, IBD flare, constipation from wegovy  - add on miralax daily, consider trial off wegovy  -CTE AB/pelvis with contrast to evaluate for any complications -Check Cdiff and GI pathogen panel to rule out infection, if negative consider prednisone  taper -Check inflammatory labs, Fecal calprotectin, CBC, CMET, CRP.  -Repeat hepatitis B and TB Gold - Will get sed rate CRP and fecal calprotectin - With ileocolonic disease will get B12, iron ferritin - will check for entyvio  antibody/titer today, patient is due 10/31 - Will discuss possible colonoscopy in 6 to 12 months after achieving clinical remission because the therapeutic goal is mucosal healing but correlation between clinical symptoms and endoscopic mucosal appearance is limited.   GERD with large hiatal hernia 05/2020 EGD large hiatal hernia gastritis single gastric polyp -On protonix  40 mg, refilled -Consider EGD if anemia  General puritits/burning pain bilateral legs/burning sensation No yellow eyes skin -Check LFTS -Check iron/ferritin/B12, CPK -Possible MSK if negative, follow up PCP  Morbid obesity/PreDM Body mass index is 34.01 kg/m.  -Patient has been advised to make an attempt to improve  diet and exercise patterns to aid in weight loss. -Recommended diet heavy in fruits and veggies and low in animal meats, cheeses, and dairy products, appropriate calorie intake - on wegovy  Discussed GLP1 with the patient, mechanism of action. Gastroparesis diet given to the patient.  Patient should be instructed to hold this medications if dose falls within 7 days of endoscopic procedure, due to increased risk of retained gastric contents.  Patient Care Team: Willo Mini, NP as PCP - General (Nurse Practitioner)  HISTORY OF PRESENT ILLNESS: 63 y.o. female presents for evaluation of ileal Crohn's disease with stricture. Last seen in the office on 07/12/2021 by Dr. Charlanne.   IBD history: Diagnosed February 2022 after colonoscopy due to IDA. 07/2020 started Humira  but developed antibodies 03/2021 Entyvio  started initially and flulike symptoms but patient found to have Campylobacter treated with azithromycin  500 mg for 3 days  Last colonoscopy: 04/10/2020 colonoscopy showed 2 polyps 2 to 4 mm ascending mid ascending colon moderate sigmoid diverticulosis terminal ileitis with terminal ileal stricture highly suggestive of Crohn's, medication patient was on Humira . Recall 05/06/2025 Last small bowel imaging:   05/2020 CT enterography 1.  CT Enterography of the abdomen and pelvis shows 2 areas of involvement of the ileum.  2.  Distal segment of ileum shows mucosal enhancement consistent with active disease. Incomplete distention of this area may be related to spasm. Structures not excluded. No evidence of obstruction.  3.  Mucosal enhancement region of the terminal ileum consistent with active disease. Incomplete distention of this area may be related to spasm. Structures not excluded. No evidence of obstruction.  4.  Possible active disease proximal portion of the appendix. No periappendiceal inflammation or enlargement of the appendix.  5.  No  evidence of fistula formation or abscess.  6.  Large  sliding hiatal hernia.  Extraintestinal manifestations: The patient has not had any extraintestinal symptoms Surgical history: no surgery.  Other significant medical history: IDA GERD EGD 2022 large hiatal hernia Current History Discussed the use of AI scribe software for clinical note transcription with the patient, who gave verbal consent to proceed.  History of Present Illness   Megan [name] is a 63 year old female with Crohn's disease who presents with worsening abdominal pain and bowel irregularities.  She was diagnosed with Crohn's disease in February 2022 following a colonoscopy. Initially treated with Humira , she developed antibodies and switched to Entyvio  in February 2023. Her last infusion was on October 23, 2023, and she is due for her next infusion on December 18, 2023. Over the past three months, she has experienced worsening abdominal pain and bowel irregularities, characterized by severe stomach pain, alternating constipation and diarrhea, and urgency. Bowel movements are painful, with periods of no bowel movement followed by loose stools. No blood in the stool, but she experiences nausea and vomiting if she does not have a bowel movement for a couple of days.  She reports generalized joint and muscle pain, describing a burning sensation in her legs, particularly at night, which she associates with restless leg syndrome. She has a history of arthritis in her knees and hips and has experienced chronic lower back pain. No new joint pain or stiffness lasting longer than an hour.  She has a history of gastroesophageal reflux disease (GERD) and a large hiatal hernia, diagnosed via endoscopy in 2022. She experiences occasional heartburn and is on pantoprazole , which she takes once daily before meals. No recent weight loss, fever, chills, or rashes, although she reports generalized itching.  Her current medications include Entyvio , pantoprazole , and Celebrex  for arthritis. She  has previously tried tramadol  for pain but found it ineffective. She is also on Wegovy  for weight management, which she has been taking for over a year.  No fever, chills, weight loss, blood in stool, rectal pain, bloating, shortness of breath, chest pain, or rashes. Reports generalized itching, nausea, vomiting, and heartburn. Experiences burning sensation in legs, associated with restless leg syndrome.      Recent labs: 07/12/2021 CRP <1.0  02/20/2021 SED RATE 28 Fecal cal 07/12/2021 175 (249) 04/13/2023 WBC 4.4 HGB 12.1 MCV 84 Platelets 200 B12 340 04/13/2023 AST 22 ALT 16 Alkphos 93 TBili 0.6  TB GOLD 03/25/2021 NEGATIVE or TB skin if indeterminate.  HepBsAG 03/25/2021 NON-REACTIVE  TPMT Activity: No results   IBD Health Care Maintenance: Annual Flu Vaccine - will get this year Pneumococcal Vaccine if receiving immunosuppression: -  get with PCP TB testing if on anti-TNF, yearly - get today COVID vaccine UTD Shingrix  UTD  Immunization History  Administered Date(s) Administered   Influenza Split 01/28/2011   Influenza,inj,Quad PF,6+ Mos 12/08/2016, 12/26/2017, 12/11/2018   Influenza-Unspecified 12/08/2016, 12/26/2017, 11/17/2020, 12/21/2021, 12/11/2022   PFIZER(Purple Top)SARS-COV-2 Vaccination 04/27/2019, 05/18/2019, 12/21/2019   Td 12/30/2006   Td (Adult),5 Lf Tetanus Toxid, Preservative Free 12/30/2006   Tdap 09/16/2018   Zoster Recombinant(Shingrix ) 02/08/2021, 08/12/2021    RELEVANT GI HISTORY, LABS, IMAGING: Colonoscopy 04/10/2020 - Two 2 to 4 mm polyps in the proximal ascending colon and in the mid ascending colon, removed with a cold biopsy forceps. Resected and retrieved. - Moderate predominantly sigmoid diverticulosis. - Terminal ileitis with terminal ileal stricture-highly suggestive of Crohn's disease. - The examination was otherwise normal on direct and retroflexion  views.   EGD 05/2020 - Large hiatal hernia. - Gastritis. Biopsied. - A single gastric polyp.  Biopsied.   CTE 05/2020: 1.  CT Enterography of the abdomen and pelvis shows 2 areas of involvement of the ileum.  2.  Distal segment of ileum shows mucosal enhancement consistent with active disease. Incomplete distention of this area may be related to spasm. Structures not excluded. No evidence of obstruction.  3.  Mucosal enhancement region of the terminal ileum consistent with active disease. Incomplete distention of this area may be related to spasm. Structures not excluded. No evidence of obstruction.  4.  Possible active disease proximal portion of the appendix. No periappendiceal inflammation or enlargement of the appendix.  5.  No evidence of fistula formation or abscess.  6.  Large sliding hiatal hernia. CBC     Latest Ref Rng & Units 07/12/2021 03/25/2021   10:26 02/20/2021   09:39 12/31/2020   00:00 09/04/2020   09:01  Inflammatory Markers  Calprotectin, Fecal 0 - 120 ug/g 175       Concentration     Interpretation   Follow-Up < 5 - 50 ug/g     Normal           None >50 -120 ug/g     Borderline       Re-evaluate in 4-6 weeks     >120 ug/g     Abnormal         Repeat as clinically                                    indicated       Sed Rate 0 - 30 mm/h   28  58    CRP 0.5 - 20.0 mg/dL <8.9  <8.9  2.0  74.3  <1.0        Latest Ref Rng & Units 03/25/2021   10:26 05/25/2020   09:36 07/07/2017   07:34  Biologic Pre-Testing  Hepatitis B Surface Ag NON-REACTIVE NON-REACTIVE  NON-REACTIVE    Hepatitis C Ab NON-REACTI   NON-REACTIVE   QuantiFERON-TB Gold Plus NEGATIVE NEGATIVE       Negative test result. M. tuberculosis complex  infection unlikely.  NEGATIVE       Negative test result. M. tuberculosis complex  infection unlikely.    NIL IU/mL 0.03  0.06    Mitogen-NIL IU/mL >10.00  3.81    TB1-NIL IU/mL 0.00  <0.00    TB2-NIL IU/mL 0.00       . The Nil tube value reflects the background interferon gamma immune response of the patient's blood sample. This value has been  subtracted from the patient's displayed TB and Mitogen results. . Lower than expected results with the Mitogen tube prevent false-negative Quantiferon readings by detecting a patient with a potential immune suppressive condition and/or suboptimal pre-analytical specimen handling. . The TB1 Antigen tube is coated with the M. tuberculosis-specific antigens designed to elicit responses from TB antigen primed CD4+ helper T-lymphocytes. . The TB2 Antigen tube is coated with the M. tuberculosis-specific antigens designed to elicit responses from TB antigen primed CD4+ helper and CD8+ cytotoxic T-lymphocytes. . For additional information, please refer to https://education.[facility].com/faq/FAQ204 (This link is being provided for informational/ educational purposes only.) .  <0.00       . The Nil tube value reflects the background interferon gamma immune response of the patient's blood sample. This value has been subtracted  from the patient's displayed TB and Mitogen results. . Lower than expected results with the Mitogen tube prevent false-negative Quantiferon readings by detecting a patient with a potential immune suppressive condition and/or suboptimal pre-analytical specimen handling. . The TB1 Antigen tube is coated with the M. tuberculosis-specific antigens designed to elicit responses from TB antigen primed CD4+ helper T-lymphocytes. . The TB2 Antigen tube is coated with the M. tuberculosis-specific antigens designed to elicit responses from TB antigen primed CD4+ helper and CD8+ cytotoxic T-lymphocytes. . For additional information, please refer to https://education.[facility].com/faq/FAQ204 (This link is being provided for informational/ educational purposes only.) .          Component Value Date/Time   WBC 4.4 04/13/2023 0915   WBC 4.6 04/10/2022 0946   RBC 4.34 04/13/2023 0915   RBC 4.23 04/10/2022 0946   HGB 12.1 04/13/2023 0915   HCT  36.6 04/13/2023 0915   PLT 200 04/13/2023 0915   MCV 84 04/13/2023 0915   MCH 27.9 04/13/2023 0915   MCH 26.7 (L) 04/10/2022 0946   MCHC 33.1 04/13/2023 0915   MCHC 32.6 04/10/2022 0946   RDW 13.9 04/13/2023 0915   LYMPHSABS 1.0 04/13/2023 0915   MONOABS 0.3 07/12/2021 0903   EOSABS 0.1 04/13/2023 0915   BASOSABS 0.0 04/13/2023 0915   Recent Labs    04/13/23 0915  HGB 12.1    CMP     Component Value Date/Time   NA 143 04/13/2023 0915   K 5.2 04/13/2023 0915   CL 103 04/13/2023 0915   CO2 22 04/13/2023 0915   GLUCOSE 88 04/13/2023 0915   GLUCOSE 108 (H) 04/10/2022 0946   BUN 13 04/13/2023 0915   CREATININE 0.76 04/13/2023 0915   CREATININE 0.81 04/10/2022 0946   CALCIUM  9.5 04/13/2023 0915   PROT 6.5 04/13/2023 0915   ALBUMIN 4.3 04/13/2023 0915   AST 22 04/13/2023 0915   ALT 16 04/13/2023 0915   ALKPHOS 93 04/13/2023 0915   BILITOT 0.6 04/13/2023 0915   GFRNONAA 82 05/19/2019 1042   GFRAA 95 05/19/2019 1042      Latest Ref Rng & Units 04/13/2023    9:15 AM 04/10/2022    9:46 AM 07/12/2021    9:03 AM  Hepatic Function  Total Protein 6.0 - 8.5 g/dL 6.5  6.8  7.1   Albumin 3.9 - 4.9 g/dL 4.3   4.3   AST 0 - 40 IU/L 22  14  19    ALT 0 - 32 IU/L 16  9  14    Alk Phosphatase 44 - 121 IU/L 93   59   Total Bilirubin 0.0 - 1.2 mg/dL 0.6  0.7  0.6       Current Medications:   Current Outpatient Medications (Endocrine & Metabolic):    predniSONE  (DELTASONE ) 50 MG tablet, Take 1 tablet (50 mg total) by mouth daily with breakfast.  Current Outpatient Medications (Cardiovascular):    atorvastatin  (LIPITOR) 10 MG tablet, Take 1 tablet (10 mg total) by mouth daily.   hydrochlorothiazide  (HYDRODIURIL ) 25 MG tablet, Take 1 tablet (25 mg total) by mouth daily.   Current Outpatient Medications (Analgesics):    acetaminophen  (TYLENOL ) 650 MG CR tablet, 1 tablet by mouth twice daily with Celebrex    diclofenac  (VOLTAREN ) 75 MG EC tablet, Take 1 tablet (75 mg total) by mouth 2  (two) times daily.   traMADol  (ULTRAM ) 50 MG tablet, Take 1 tablet (50 mg total) by mouth every 8 (eight) hours as needed for moderate pain (pain score 4-6).  Current Outpatient Medications (Other):    buPROPion  (WELLBUTRIN  XL) 150 MG 24 hr tablet, Take 1 tablet (150 mg total) by mouth daily.   Semaglutide -Weight Management (WEGOVY ) 2.4 MG/0.75ML SOAJ, Inject 2.4 mg into the skin once a week.   sertraline  (ZOLOFT ) 50 MG tablet, TAKE 1 TABLET(50 MG) BY MOUTH AT BEDTIME   Vedolizumab  (ENTYVIO  IV), Inject into the vein.   pantoprazole  (PROTONIX ) 40 MG tablet, Take 1 tablet (40 mg total) by mouth daily.  Medical History:  Past Medical History:  Diagnosis Date   Acute respiratory failure (HCC)    Anxiety and depression 04/10/2022   Arthritis    Crohn's disease (HCC)    Fibromyalgia    GERD (gastroesophageal reflux disease)    Hypertension    Major depressive disorder with single episode, in full remission 06/15/2017   Menopausal vasomotor syndrome    Obesity    Pre-diabetes    RSV (respiratory syncytial virus pneumonia)    Allergies: No Known Allergies   Surgical History:  She  has a past surgical history that includes Ankle arthroscopy (Left, 1985); Dilation and curettage of uterus (N/A, 08/14/2017); Replacement total knee (Right, 2021); and Colonoscopy (2011). Family History:  Her family history includes Breast cancer in her maternal grandmother; Colon polyps in her father; Heart disease in her father and mother; Hypertension in her mother.  REVIEW OF SYSTEMS  : All other systems reviewed and negative except where noted in the History of Present Illness.  PHYSICAL EXAM: BP 100/70   Pulse (!) 104   Ht 5' 3 (1.6 m)   Wt 192 lb (87.1 kg)   BMI 34.01 kg/m  General Appearance: Well nourished, in no apparent distress. Head:   Normocephalic and atraumatic. Eyes:  sclerae anicteric,conjunctive pink  Respiratory: Respiratory effort normal, BS equal bilaterally without rales,  rhonchi, wheezing. Cardio: RRR with no MRGs. Peripheral pulses intact.  Abdomen: Soft,  Obese ,active bowel sounds. mild tenderness in the lower abdomen. Without guarding and Without rebound. No masses. Rectal: Normal external rectal exam, normal rectal tone, no internal hemorrhoids appreciated, no masses, non tender, brown stool, hemoccult Negative Musculoskeletal: Full ROM, Normal gait. Without edema. Skin:  Dry and intact without significant lesions or rashes Neuro: Alert and  oriented x4;  No focal deficits. Psych:  Cooperative. Normal mood and affect.    Alan JONELLE Coombs, PA-C 11:53 AM

## 2023-12-16 ENCOUNTER — Telehealth: Payer: Self-pay | Admitting: Physician Assistant

## 2023-12-16 ENCOUNTER — Other Ambulatory Visit (INDEPENDENT_AMBULATORY_CARE_PROVIDER_SITE_OTHER)

## 2023-12-16 ENCOUNTER — Ambulatory Visit (INDEPENDENT_AMBULATORY_CARE_PROVIDER_SITE_OTHER): Admitting: Physician Assistant

## 2023-12-16 ENCOUNTER — Ambulatory Visit: Payer: Self-pay | Admitting: Physician Assistant

## 2023-12-16 ENCOUNTER — Encounter: Payer: Self-pay | Admitting: Physician Assistant

## 2023-12-16 VITALS — BP 100/70 | HR 104 | Ht 63.0 in | Wt 192.0 lb

## 2023-12-16 DIAGNOSIS — K50018 Crohn's disease of small intestine with other complication: Secondary | ICD-10-CM

## 2023-12-16 DIAGNOSIS — M791 Myalgia, unspecified site: Secondary | ICD-10-CM

## 2023-12-16 DIAGNOSIS — D509 Iron deficiency anemia, unspecified: Secondary | ICD-10-CM

## 2023-12-16 DIAGNOSIS — E538 Deficiency of other specified B group vitamins: Secondary | ICD-10-CM | POA: Diagnosis not present

## 2023-12-16 DIAGNOSIS — Z111 Encounter for screening for respiratory tuberculosis: Secondary | ICD-10-CM

## 2023-12-16 DIAGNOSIS — R112 Nausea with vomiting, unspecified: Secondary | ICD-10-CM

## 2023-12-16 DIAGNOSIS — Z1159 Encounter for screening for other viral diseases: Secondary | ICD-10-CM

## 2023-12-16 DIAGNOSIS — R7612 Nonspecific reaction to cell mediated immunity measurement of gamma interferon antigen response without active tuberculosis: Secondary | ICD-10-CM

## 2023-12-16 DIAGNOSIS — K219 Gastro-esophageal reflux disease without esophagitis: Secondary | ICD-10-CM

## 2023-12-16 DIAGNOSIS — K449 Diaphragmatic hernia without obstruction or gangrene: Secondary | ICD-10-CM

## 2023-12-16 DIAGNOSIS — K5900 Constipation, unspecified: Secondary | ICD-10-CM | POA: Diagnosis not present

## 2023-12-16 LAB — IBC + FERRITIN
Ferritin: 11 ng/mL (ref 10.0–291.0)
Iron: 59 ug/dL (ref 42–145)
Saturation Ratios: 12.3 % — ABNORMAL LOW (ref 20.0–50.0)
TIBC: 478.8 ug/dL — ABNORMAL HIGH (ref 250.0–450.0)
Transferrin: 342 mg/dL (ref 212.0–360.0)

## 2023-12-16 LAB — CBC WITH DIFFERENTIAL/PLATELET
Basophils Absolute: 0 K/uL (ref 0.0–0.1)
Basophils Relative: 0.5 % (ref 0.0–3.0)
Eosinophils Absolute: 0.1 K/uL (ref 0.0–0.7)
Eosinophils Relative: 1.1 % (ref 0.0–5.0)
HCT: 36.5 % (ref 36.0–46.0)
Hemoglobin: 12.1 g/dL (ref 12.0–15.0)
Lymphocytes Relative: 15.2 % (ref 12.0–46.0)
Lymphs Abs: 1 K/uL (ref 0.7–4.0)
MCHC: 33.2 g/dL (ref 30.0–36.0)
MCV: 80.2 fl (ref 78.0–100.0)
Monocytes Absolute: 0.5 K/uL (ref 0.1–1.0)
Monocytes Relative: 7.5 % (ref 3.0–12.0)
Neutro Abs: 5 K/uL (ref 1.4–7.7)
Neutrophils Relative %: 75.7 % (ref 43.0–77.0)
Platelets: 272 K/uL (ref 150.0–400.0)
RBC: 4.55 Mil/uL (ref 3.87–5.11)
RDW: 13.9 % (ref 11.5–15.5)
WBC: 6.6 K/uL (ref 4.0–10.5)

## 2023-12-16 LAB — BASIC METABOLIC PANEL WITH GFR
BUN: 25 mg/dL — ABNORMAL HIGH (ref 6–23)
CO2: 28 meq/L (ref 19–32)
Calcium: 9.8 mg/dL (ref 8.4–10.5)
Chloride: 101 meq/L (ref 96–112)
Creatinine, Ser: 1.05 mg/dL (ref 0.40–1.20)
GFR: 56.36 mL/min — ABNORMAL LOW (ref >60.00)
Glucose, Bld: 99 mg/dL (ref 70–99)
Potassium: 4.4 meq/L (ref 3.5–5.1)
Sodium: 137 meq/L (ref 135–145)

## 2023-12-16 LAB — C-REACTIVE PROTEIN: CRP: <0.5 mg/dL (ref 0.5–20.0)

## 2023-12-16 LAB — HEPATIC FUNCTION PANEL
ALT: 11 U/L (ref 0–35)
AST: 14 U/L (ref 0–37)
Albumin: 4.7 g/dL (ref 3.5–5.2)
Alkaline Phosphatase: 75 U/L (ref 39–117)
Bilirubin, Direct: 0.1 mg/dL (ref 0.0–0.3)
Total Bilirubin: 0.6 mg/dL (ref 0.2–1.2)
Total Protein: 7.5 g/dL (ref 6.0–8.3)

## 2023-12-16 LAB — VITAMIN B12: Vitamin B-12: 249 pg/mL (ref 211–911)

## 2023-12-16 LAB — SEDIMENTATION RATE: Sed Rate: 10 mm/h (ref 0–30)

## 2023-12-16 LAB — CK: Total CK: 52 U/L (ref 17–177)

## 2023-12-16 MED ORDER — PANTOPRAZOLE SODIUM 40 MG PO TBEC: 40.0000 mg | DELAYED_RELEASE_TABLET | Freq: Every day | ORAL | 3 refills | Status: AC

## 2023-12-16 NOTE — Telephone Encounter (Addendum)
 Patient made aware to complete the stool study she gotten from the lab and why she needed to do it. Patient was made aware her insurance company would be calling her too regarding her CT auth per April Case is pending site approval-- [employer] will contact pt to make sure she wants it done at [facility]- Pending Case # [id number]

## 2023-12-16 NOTE — Patient Instructions (Addendum)
 _______________________________________________________  If your blood pressure at your visit was 140/90 or greater, please contact your primary care physician to follow up on this.  _______________________________________________________  If you are age 64 or older, your body mass index should be between 23-30. Your Body mass index is 34.01 kg/m. If this is out of the aforementioned range listed, please consider follow up with your Primary Care Provider.  If you are age 34 or younger, your body mass index should be between 19-25. Your Body mass index is 34.01 kg/m. If this is out of the aformentioned range listed, please consider follow up with your Primary Care Provider.   ________________________________________________________  The [facility] providers would like to encourage you to use MYCHART to communicate with providers for non-urgent requests or questions.  Due to long hold times on the telephone, sending your provider a message by [facility] may be a faster and more efficient way to get a response.  Please allow 48 business hours for a response.  Please remember that this is for non-urgent requests.  _______________________________________________________  [facility] is using a team-based approach to care.  Your team is made up of your doctor and two to three APPS. Our APPS (Nurse Practitioners and Physician Assistants) work with your physician to ensure care continuity for you. They are fully qualified to address your health concerns and develop a treatment plan. They communicate directly with your gastroenterologist to care for you. Seeing the Advanced Practice Practitioners on your physician's team can help you by facilitating care more promptly, often allowing for earlier appointments, access to diagnostic testing, procedures, and other specialty referrals.   Please call us  in 1 week regarding your CT auth for [facility]  imaging center  Your provider has  requested that you go to the basement level for lab work before leaving today. Press B on the elevator. The lab is located at the first door on the left as you exit the elevator.  Your provider has ordered Diatherix stool testing for you. You have received a kit from our office today containing all necessary supplies to complete this test. Please carefully read the stool collection instructions provided in the kit before opening the accompanying materials. In addition, be sure there is a label providing your full name and date of birth on the puritan opti-swab tube that is supplied in the kit (if you do not see a label with this information on your test tube, please make us  aware before test collection!). After completing the test, you should secure the purtian tube into the specimen biohazard bag. The [facility] E-Req sheet (including date and time of specimen collection) should be placed into the outside pocket of the specimen biohazard bag and returned to the [facility] lab (basement floor of [facility]) within 2 days of collection. Please make sure to give the specimen to a staff member at the lab. DO NOT leave the specimen on the counter.   If the specimen date and time (can be found in the upper right boxed portion of the sheet) are not filled out on the E-Req sheet, the test will NOT be performed.     Miralax is an osmotic laxative.  It only brings more water into the stool.  This is safe to take daily.  Can take up to 17 gram of miralax twice a day.  Mix with juice or coffee.  Start 1 capful  for 3-4 days and reassess your response in 3-4 days.  You can increase and decrease the dose based on your response.  Remember, it can take up to 3-4 days to take effect OR for the effects to wear off.    I often pair this with benefiber in the morning to help assure the stool is not too loose.   Please do the following: Purchase a bottle of Miralax over the  counter as well as a box of 5 mg dulcolax tablets. Take 4 dulcolax tablets. Wait 1 hour. You will then drink 6-8 capfuls of Miralax mixed in an adequate amount of water/juice/gatorade (you may choose which of these liquids to drink) over the next 2-3 hours. You should expect results within 1 to 6 hours after completing the bowel purge. Go to the er if you have severe AB pain, can not pass gas or stool in over 12 hours, can not hold down any food.   Understanding Your Weekly GLP-1 Injection  A helpful guide to managing common side effects  You are on a once-weekly injectable medication in the GLP-1 receptor agonist class. These medications can be very effective for blood sugar control, weight loss, and heart protection, fatty liver or OSA, but they can also come with some side effects that are important to understand. The good news is: most side effects can be managed with a few adjustments.  1. Gastroparesis-Like Symptoms These medications slow down your stomach to help you feel full longer -- great for weight loss and blood sugar control, but they can sometimes cause symptoms that feel like gastroparesis (slow stomach emptying). Symptoms may include: -Feeling full quickly when eating -Nausea or vomiting -Bloating or abdominal discomfort -Worsening heartburn or reflux -Acid regurgitation -Stomach spasms or tightness What you can do: ??? Eat small, frequent meals (4-6 per day) ?? Drink fluids between meals, not during ?? Avoid high-fiber foods (like raw veggies or whole grains); cook your veggies well ?? Spread protein throughout the day (try Greek yogurt, eggs, soft meats, Glucerna, milk) ?? Choose soft foods you can mash with a fork ?? Switch to pured foods or liquids during flare-ups ?? Consider reading: Living Well with Gastroparesis by Camelia Bone ?? Downloadable Diet Guide: [facility] Gastroparesis Diet PDF  ?? Tip: Try following a gastroparesis-friendly diet on the  day of your injection and the day after, when the medication's effect is strongest.  2. Constipation Since this medication slows down your digestive system, constipation is very common. Tips to help: ?? Drink plenty of water ???? Stay active with regular exercise ?? Add fiber-rich but gentle foods like kiwi ?? Try a low-dose magnesium supplement at night ?? Use MiraLAX (half to one capful daily) if constipation becomes more frequent, especially if your dose increases  If these strategies don't help, talk to your provider -- they may recommend or prescribe other treatments.  3. When to Call the Doctor or Go to the ER While rare, this medication can slightly increase your risk of serious conditions like: Pancreatitis (inflammation of the pancreas) Gallstones or gallbladder problems Watch for these signs and seek help if you experience: Severe abdominal pain (especially in the upper belly or that radiates to your back) Pain in the right upper side of your abdomen Nausea, vomiting, fever, or chills that don't go away ?? Call your provider or go to the ER if these occur.  4. Who Should NOT Take This Medication? This medication should be avoided if you have: A personal history of pancreatitis  A personal or family history of medullary  thyroid cancer A condition called Multiple Endocrine Neoplasia Syndrome Type 2 (MEN2)  Final Note If the side effects are too bothersome, remember: Most symptoms will go away if you stop the medication. But many people tolerate it well after the first few weeks, especially with the right strategies in place.   Please visit these websites below for more information: [email or web address].org/ [email or web address].com.cy http://www.ibdmedicationguide.org/   Health Maintenance in Inflammatory Bowel Disease  Vaccines Inflammatory Bowel Disease (IBD) is often treated with immunomodulatory medications (6-mercaptopurine, azathioprine, methotrexate),  biologic therapies (adalimumab , certulizomab, natalizumab, infliximab, vedolizumab ) or chronic steroids (at least 10mg  daily for 3 months), which suppress the immune system.  Patients receiving this type of therapy are at higher risk to develop infections.  Many infections can be prevented by vaccinations. Inflammatory bowel disease within itself can cause inflammation and weaken the immune system.  Discuss vaccinations with your physician, but in general, IBD patients SHOULD receive the following vaccines:  Shingles vaccination Patient is at increased risk for immune compromised such as those with inflammatory bowel disease are able to get the Shingrix  vaccine age 51 and up. 2 part shot, second shot between second and 92-month.  If you go over 6 months for the second shot have to restart series. Shingles is herpes virus dormant along the nerve root, can cause deafness, blindness and lasting pain.  Hepatitis A and B -We can test for this and able to get this in our office.   Human papilloma virus (HPV)  The HPV vaccine is recommended in females aged 60-45. It is a 2-dose vaccine.   The HPV vaccine is also now recommended in males aged 13-45  Influenza  All adults should get the influenza vaccine annually.  IBD patients on immunosuppressive therapy should NOT get the intranasal vaccine, as it is a live vaccine.  Pneumococcal  All adults 65 years or older should receive the Pneumococcal Conjugate Vaccine (PCV13), followed by the Pneumococcal Polysaccharide Vaccine (PPSV23) at least 1 year later.  In addition, patients over the age of 17 on immunosuppressive therapy should receive the PCV13, followed by PPSV23 no sooner than 8 weeks later.  A booster PPSV23 is also recommended 5 years later.  Finally, patients that smoke should get the PPSV23 if not otherwise vaccinated.  Tetanus, diphtheria, pertussis (Tdap/Td)  All adults should receive a 1-time Tdap.  A Td booster should be administered  every 10 years.   Bone Health Patients with IBD can have markedly lower bone mineral densities than patients without IBD. Low bone mineral density is associated with fractures. Therefore, screening for bone disease, such as osteoporosis and osteopenia, is important in certain populations.  The American Gastroenterology Association (AGA) and the American College of Gastroenterology [facility]) recommend dual-energy x-ray absorptiometry scanning (DEXA) in postmenopausal women, men over the age of 49, patients with prolonged corticosteroid use (greater than 3 consecutive months or recurrent courses), patients with a personal history of low trauma fracture and patients with hypogonadism.   Tobacco Cessation Smoking worsens Crohn's disease. In addition, smoking is associated with many known cardiac, pulmonary and oncologic risks.   If you need help with quitting smoking, please talk to your doctor and call 1-800-QUIT NOW.  Cancer Screening IBD patients on immunosuppressive therapy may be more susceptible to certain types of cancer. However, specific evidence is conflicting. Therefore, patients with IBD should undergo age- and sex-specific cancer screening. Please discuss this important issue with your primary care provider.  Colon Cancer  Patients with either Ulcerative Colitis (UC) or Crohn's  disease involving the colon should have colon cancer screening 8-10 years after initial diagnosis. After that time, the frequency of surveillance colonocoscopy will be determined by your gastroenterologist, but will usually occur every 1-2 years.   Patients with IBD and Primary Sclerosing Cholangitis (PSC) need a screening colonoscopy at the time of diagnosis of PSC and annually thereafter.

## 2023-12-16 NOTE — Telephone Encounter (Signed)
 Inbound call from patient stating she was just seen a little while ago. She states that amanda did some type of stool test while she was in the office and when she went to the lab they gave her a stool kit to take home. Patient is requesting a call back to discuss if it is necessary to complete. Please advise.

## 2023-12-18 ENCOUNTER — Other Ambulatory Visit

## 2023-12-18 ENCOUNTER — Ambulatory Visit (INDEPENDENT_AMBULATORY_CARE_PROVIDER_SITE_OTHER)

## 2023-12-18 VITALS — BP 117/77 | HR 70 | Temp 98.2°F | Resp 16 | Ht 63.0 in | Wt 189.8 lb

## 2023-12-18 DIAGNOSIS — K50018 Crohn's disease of small intestine with other complication: Secondary | ICD-10-CM | POA: Diagnosis not present

## 2023-12-18 LAB — QUANTIFERON-TB GOLD PLUS
Mitogen-NIL: 0.43 [IU]/mL
NIL: 0.01 [IU]/mL
QuantiFERON-TB Gold Plus: UNDETERMINED — AB
TB1-NIL: 0 [IU]/mL
TB2-NIL: 0 [IU]/mL

## 2023-12-18 LAB — HEPATITIS B SURFACE ANTIGEN

## 2023-12-18 MED ORDER — VEDOLIZUMAB 300 MG IV SOLR
300.0000 mg | Freq: Once | INTRAVENOUS | Status: AC
  Administered 2023-12-18: 300 mg via INTRAVENOUS
  Filled 2023-12-18: qty 5

## 2023-12-18 NOTE — Progress Notes (Signed)
 Diagnosis: Crohn's Disease  Provider:  Praveen Mannam MD  Procedure: IV Infusion  IV Type: Peripheral, IV Location: L Forearm  Entyvio  (Vedolizumab ), Dose: 300 mg  Infusion Start Time: 0847  Infusion Stop Time: 0922  Post Infusion IV Care: Peripheral IV Discontinued  Discharge: Condition: Good, Destination: Home . AVS Declined  Performed by:  Eleanor DELENA Bloch, RN

## 2023-12-21 NOTE — Telephone Encounter (Signed)
 Lab & chest x-ray orders in epic.

## 2023-12-22 ENCOUNTER — Telehealth: Payer: Self-pay | Admitting: Physician Assistant

## 2023-12-22 ENCOUNTER — Other Ambulatory Visit (INDEPENDENT_AMBULATORY_CARE_PROVIDER_SITE_OTHER)

## 2023-12-22 ENCOUNTER — Ambulatory Visit
Admission: RE | Admit: 2023-12-22 | Discharge: 2023-12-22 | Disposition: A | Source: Ambulatory Visit | Attending: Physician Assistant | Admitting: Physician Assistant

## 2023-12-22 DIAGNOSIS — K50018 Crohn's disease of small intestine with other complication: Secondary | ICD-10-CM

## 2023-12-22 DIAGNOSIS — Z1159 Encounter for screening for other viral diseases: Secondary | ICD-10-CM

## 2023-12-22 DIAGNOSIS — R7612 Nonspecific reaction to cell mediated immunity measurement of gamma interferon antigen response without active tuberculosis: Secondary | ICD-10-CM

## 2023-12-22 LAB — CALPROTECTIN, FECAL: Calprotectin, Fecal: 85 ug/g (ref 0–120)

## 2023-12-22 NOTE — Telephone Encounter (Signed)
 [facility] GI stool pathogen negative

## 2023-12-23 ENCOUNTER — Ambulatory Visit: Payer: Self-pay | Admitting: Physician Assistant

## 2023-12-23 ENCOUNTER — Encounter: Payer: Self-pay | Admitting: Medical-Surgical

## 2023-12-23 LAB — HEPATITIS B SURFACE ANTIGEN: Hepatitis B Surface Ag: NONREACTIVE

## 2023-12-23 NOTE — Telephone Encounter (Signed)
 Patient said she would schedule her Ct scan after the first of the year and she didn't want me to schedule it in 2026 told her that the Auth is good until 06-13-24 per April and she voiced understanding  Auth #[id number] CT SCAN Dates: 12/16/23 - 06/13/24

## 2023-12-24 ENCOUNTER — Ambulatory Visit: Payer: Self-pay | Admitting: Physician Assistant

## 2023-12-29 LAB — VEDOLIZUMAB AND ANTI-VEDO AB
Anti-Vedolizumab Antibody: <25 ng/mL
Vedolizumab: 12 ug/mL

## 2023-12-29 LAB — SERIAL MONITORING

## 2023-12-29 NOTE — Addendum Note (Signed)
 Addended by: Teddie Mehta N on: 12/29/2023 01:04 PM   Modules accepted: Orders

## 2024-01-05 ENCOUNTER — Encounter: Payer: Self-pay | Admitting: Physician Assistant

## 2024-01-06 ENCOUNTER — Other Ambulatory Visit: Payer: Self-pay | Admitting: Medical-Surgical

## 2024-01-08 ENCOUNTER — Other Ambulatory Visit: Payer: Self-pay | Admitting: Medical-Surgical

## 2024-01-08 DIAGNOSIS — I10 Essential (primary) hypertension: Secondary | ICD-10-CM

## 2024-01-17 ENCOUNTER — Other Ambulatory Visit: Payer: Self-pay | Admitting: Gastroenterology

## 2024-01-17 DIAGNOSIS — K219 Gastro-esophageal reflux disease without esophagitis: Secondary | ICD-10-CM

## 2024-01-19 ENCOUNTER — Ambulatory Visit (INDEPENDENT_AMBULATORY_CARE_PROVIDER_SITE_OTHER): Admitting: Medical-Surgical

## 2024-01-19 ENCOUNTER — Encounter: Payer: Self-pay | Admitting: Medical-Surgical

## 2024-01-19 VITALS — BP 107/72 | HR 83 | Resp 20 | Ht 63.0 in | Wt 190.0 lb

## 2024-01-19 DIAGNOSIS — Z111 Encounter for screening for respiratory tuberculosis: Secondary | ICD-10-CM

## 2024-01-19 DIAGNOSIS — E66811 Obesity, class 1: Secondary | ICD-10-CM

## 2024-01-19 DIAGNOSIS — Z6833 Body mass index (BMI) 33.0-33.9, adult: Secondary | ICD-10-CM

## 2024-01-19 DIAGNOSIS — E6609 Other obesity due to excess calories: Secondary | ICD-10-CM

## 2024-01-19 DIAGNOSIS — Z0000 Encounter for general adult medical examination without abnormal findings: Secondary | ICD-10-CM

## 2024-01-19 DIAGNOSIS — G2581 Restless legs syndrome: Secondary | ICD-10-CM | POA: Diagnosis not present

## 2024-01-19 DIAGNOSIS — M79604 Pain in right leg: Secondary | ICD-10-CM

## 2024-01-19 DIAGNOSIS — M79605 Pain in left leg: Secondary | ICD-10-CM

## 2024-01-19 DIAGNOSIS — R7303 Prediabetes: Secondary | ICD-10-CM

## 2024-01-19 MED ORDER — TRAMADOL HCL 50 MG PO TABS: 50.0000 mg | ORAL_TABLET | Freq: Three times a day (TID) | ORAL | 0 refills | Status: AC | PRN

## 2024-01-19 NOTE — Patient Instructions (Signed)
 Preventive Care 58-64 Years Old, Female  Preventive care refers to lifestyle choices and visits with your health care provider that can promote health and wellness. Preventive care visits are also called wellness exams.  What can I expect for my preventive care visit?  Counseling  Your health care provider may ask you questions about your:  Medical history, including:  Past medical problems.  Family medical history.  Pregnancy history.  Current health, including:  Menstrual cycle.  Method of birth control.  Emotional well-being.  Home life and relationship well-being.  Sexual activity and sexual health.  Lifestyle, including:  Alcohol, nicotine or tobacco, and drug use.  Access to firearms.  Diet, exercise, and sleep habits.  Work and work Astronomer.  Sunscreen use.  Safety issues such as seatbelt and bike helmet use.  Physical exam  Your health care provider will check your:  Height and weight. These may be used to calculate your BMI (body mass index). BMI is a measurement that tells if you are at a healthy weight.  Waist circumference. This measures the distance around your waistline. This measurement also tells if you are at a healthy weight and may help predict your risk of certain diseases, such as type 2 diabetes and high blood pressure.  Heart rate and blood pressure.  Body temperature.  Skin for abnormal spots.  What immunizations do I need?    Vaccines are usually given at various ages, according to a schedule. Your health care provider will recommend vaccines for you based on your age, medical history, and lifestyle or other factors, such as travel or where you work.  What tests do I need?  Screening  Your health care provider may recommend screening tests for certain conditions. This may include:  Lipid and cholesterol levels.  Diabetes screening. This is done by checking your blood sugar (glucose) after you have not eaten for a while (fasting).  Pelvic exam and Pap test.  Hepatitis B test.  Hepatitis C  test.  HIV (human immunodeficiency virus) test.  STI (sexually transmitted infection) testing, if you are at risk.  Lung cancer screening.  Colorectal cancer screening.  Mammogram. Talk with your health care provider about when you should start having regular mammograms. This may depend on whether you have a family history of breast cancer.  BRCA-related cancer screening. This may be done if you have a family history of breast, ovarian, tubal, or peritoneal cancers.  Bone density scan. This is done to screen for osteoporosis.  Talk with your health care provider about your test results, treatment options, and if necessary, the need for more tests.  Follow these instructions at home:  Eating and drinking    Eat a diet that includes fresh fruits and vegetables, whole grains, lean protein, and low-fat dairy products.  Take vitamin and mineral supplements as recommended by your health care provider.  Do not drink alcohol if:  Your health care provider tells you not to drink.  You are pregnant, may be pregnant, or are planning to become pregnant.  If you drink alcohol:  Limit how much you have to 0-1 drink a day.  Know how much alcohol is in your drink. In the [place] one drink equals one 12 oz bottle of beer (355 mL), one 5 oz glass of wine (148 mL), or one 1 oz glass of hard liquor (44 mL).  Lifestyle  Brush your teeth every morning and night with fluoride toothpaste. Floss one time each day.  Exercise for at least  30 minutes 5 or more days each week.  Do not use any products that contain nicotine or tobacco. These products include cigarettes, chewing tobacco, and vaping devices, such as e-cigarettes. If you need help quitting, ask your health care provider.  Do not use drugs.  If you are sexually active, practice safe sex. Use a condom or other form of protection to prevent STIs.  If you do not wish to become pregnant, use a form of birth control. If you plan to become pregnant, see your health care provider for a  prepregnancy visit.  Take aspirin only as told by your health care provider. Make sure that you understand how much to take and what form to take. Work with your health care provider to find out whether it is safe and beneficial for you to take aspirin daily.  Find healthy ways to manage stress, such as:  Meditation, yoga, or listening to music.  Journaling.  Talking to a trusted person.  Spending time with friends and family.  Minimize exposure to UV radiation to reduce your risk of skin cancer.  Safety  Always wear your seat belt while driving or riding in a vehicle.  Do not drive:  If you have been drinking alcohol. Do not ride with someone who has been drinking.  When you are tired or distracted.  While texting.  If you have been using any mind-altering substances or drugs.  Wear a helmet and other protective equipment during sports activities.  If you have firearms in your house, make sure you follow all gun safety procedures.  Seek help if you have been physically or sexually abused.  What's next?  Visit your health care provider once a year for an annual wellness visit.  Ask your health care provider how often you should have your eyes and teeth checked.  Stay up to date on all vaccines.  This information is not intended to replace advice given to you by your health care provider. Make sure you discuss any questions you have with your health care provider.  Document Revised: 08/01/2020 Document Reviewed: 08/01/2020  Elsevier Patient Education  2024 [employer].

## 2024-01-19 NOTE — Progress Notes (Signed)
 Complete physical exam  Patient: Megan [name]   DOB: [birth year]   63 y.o. Female  MRN: [id number]  Subjective:    Chief Complaint  Patient presents with   Annual Exam    Megan [name] is a 63 y.o. female who presents today for a complete physical exam. She reports consuming a general diet, quit drinking sodas. Walking twice a week for exercise. She generally feels poorly. She reports sleeping poorly. She does not have additional problems to discuss today.    Most recent fall risk assessment:    01/19/2024   10:31 AM  Fall Risk   Falls in the past year? 0  Number falls in past yr: 0  Injury with Fall? 0  Risk for fall due to : No Fall Risks  Follow up Falls evaluation completed     Most recent depression screenings:    01/19/2024   10:31 AM 10/12/2023    8:35 AM  PHQ 2/9 Scores  PHQ - 2 Score 0 0  PHQ- 9 Score 6 2      Data saved with a previous flowsheet row definition    Vision:Within last year and Dental: No current dental problems and Receives regular dental care    Patient Care Team: Willo Mini, NP as PCP - General (Nurse Practitioner)   Outpatient Medications Prior to Visit  Medication Sig   acetaminophen  (TYLENOL ) 650 MG CR tablet 1 tablet by mouth twice daily with Celebrex    atorvastatin  (LIPITOR) 10 MG tablet Take 1 tablet (10 mg total) by mouth daily.   buPROPion  (WELLBUTRIN  XL) 150 MG 24 hr tablet Take 1 tablet (150 mg total) by mouth daily.   diclofenac  (VOLTAREN ) 75 MG EC tablet TAKE 1 TABLET(75 MG) BY MOUTH TWICE DAILY   hydrochlorothiazide  (HYDRODIURIL ) 25 MG tablet Take 1 tablet (25 mg total) by mouth daily.   pantoprazole  (PROTONIX ) 40 MG tablet Take 1 tablet (40 mg total) by mouth daily.   Semaglutide -Weight Management (WEGOVY ) 2.4 MG/0.75ML SOAJ Inject 2.4 mg into the skin once a week.   sertraline  (ZOLOFT ) 50 MG tablet TAKE 1 TABLET(50 MG) BY MOUTH AT BEDTIME   Vedolizumab  (ENTYVIO  IV) Inject into the vein.   [DISCONTINUED] traMADol   (ULTRAM ) 50 MG tablet Take 1 tablet (50 mg total) by mouth every 8 (eight) hours as needed for moderate pain (pain score 4-6).   [DISCONTINUED] predniSONE  (DELTASONE ) 50 MG tablet Take 1 tablet (50 mg total) by mouth daily with breakfast.   No facility-administered medications prior to visit.    Review of Systems  Constitutional:  Positive for malaise/fatigue. Negative for chills, fever and weight loss.  HENT:  Negative for congestion, ear pain, hearing loss, sinus pain and sore throat.   Eyes:  Negative for blurred vision, photophobia and pain.  Respiratory:  Negative for cough, shortness of breath and wheezing.   Cardiovascular:  Negative for chest pain, palpitations and leg swelling.  Gastrointestinal:  Negative for abdominal pain, constipation, diarrhea, heartburn, nausea and vomiting.       Crohn's flares intermittently  Genitourinary:  Negative for dysuria, frequency and urgency.  Musculoskeletal:  Positive for joint pain and myalgias. Negative for falls and neck pain.  Skin:  Negative for itching and rash.  Neurological:  Negative for dizziness, weakness and headaches.  Endo/Heme/Allergies:  Negative for polydipsia. Does not bruise/bleed easily.  Psychiatric/Behavioral:  Negative for depression, substance abuse and suicidal ideas. The patient has insomnia. The patient is not nervous/anxious.      Objective:  BP 107/72 (BP Location: Right Arm, Cuff Size: Normal)   Pulse 83   Resp 20   Ht 5' 3 (1.6 m)   Wt 190 lb (86.2 kg)   SpO2 97%   BMI 33.66 kg/m    Physical Exam Constitutional:      General: She is not in acute distress.    Appearance: Normal appearance. She is not ill-appearing.  HENT:     Head: Normocephalic and atraumatic.     Right Ear: Tympanic membrane, ear canal and external ear normal. There is no impacted cerumen.     Left Ear: Tympanic membrane, ear canal and external ear normal. There is no impacted cerumen.     Nose: Nose normal. No congestion or  rhinorrhea.     Mouth/Throat:     Mouth: Mucous membranes are moist.     Pharynx: No oropharyngeal exudate or posterior oropharyngeal erythema.  Eyes:     General: No scleral icterus.       Right eye: No discharge.        Left eye: No discharge.     Extraocular Movements: Extraocular movements intact.     Conjunctiva/sclera: Conjunctivae normal.     Pupils: Pupils are equal, round, and reactive to light.  Neck:     Thyroid: No thyromegaly.     Vascular: No carotid bruit or JVD.     Trachea: Trachea normal.  Cardiovascular:     Rate and Rhythm: Normal rate and regular rhythm.     Pulses: Normal pulses.     Heart sounds: Normal heart sounds. No murmur heard.    No friction rub. No gallop.  Pulmonary:     Effort: Pulmonary effort is normal. No respiratory distress.     Breath sounds: Normal breath sounds. No wheezing.  Abdominal:     General: Bowel sounds are normal. There is no distension.     Palpations: Abdomen is soft.     Tenderness: There is no abdominal tenderness. There is no guarding.  Musculoskeletal:        General: Normal range of motion.     Cervical back: Normal range of motion and neck supple.  Lymphadenopathy:     Cervical: No cervical adenopathy.  Skin:    General: Skin is warm and dry.  Neurological:     Mental Status: She is alert and oriented to person, place, and time.     Cranial Nerves: No cranial nerve deficit.  Psychiatric:        Mood and Affect: Mood normal.        Behavior: Behavior normal.        Thought Content: Thought content normal.        Judgment: Judgment normal.      No results found for any visits on 01/19/24.     Assessment & Plan:    Routine Health Maintenance and Physical Exam  Immunization History  Administered Date(s) Administered   Influenza Split 01/28/2011   Influenza,inj,Quad PF,6+ Mos 12/08/2016, 12/26/2017, 12/11/2018   Influenza-Unspecified 12/08/2016, 12/26/2017, 11/17/2020, 12/21/2021, 12/11/2022, 12/28/2023    PFIZER(Purple Top)SARS-COV-2 Vaccination 04/27/2019, 05/18/2019, 12/21/2019   Td 12/30/2006   Td (Adult),5 Lf Tetanus Toxid, Preservative Free 12/30/2006   Tdap 09/16/2018   Zoster Recombinant(Shingrix ) 02/08/2021, 08/12/2021    Health Maintenance  Topic Date Due   Pneumococcal Vaccine: 50+ Years (1 of 1 - PCV) Never done   COVID-19 Vaccine (4 - 2025-26 season) 02/04/2024 (Originally 10/19/2023)   Colonoscopy  04/10/2025   Mammogram  06/23/2025  Cervical Cancer Screening (HPV/Pap Cotest)  10/14/2027   DTaP/Tdap/Td (4 - Td or Tdap) 09/15/2028   Influenza Vaccine  Completed   Hepatitis C Screening  Completed   Zoster Vaccines- Shingrix   Completed   Hepatitis B Vaccines 19-59 Average Risk  Aged Out   HPV VACCINES  Aged Out   Meningococcal B Vaccine  Aged Out   HIV Screening  Discontinued    Discussed health benefits of physical activity, and encouraged her to engage in regular exercise appropriate for her age and condition.  1. Annual physical exam (Primary) Checking labs as below. UTD on preventative care. Wellness information provided with AVS. - Basic metabolic panel with GFR  2. Restless leg syndrome Unclear if this is related to her lower extremity pain or if it's a true RLS. Discussed adding gabapentin  at HS to see if this would help. Took this before and was well tolerated. Will try a couple of weeks off of atorvastatin  and if no improvement, reevaluate options.   3. Bilateral lower extremity pain Unclear etiology. Lumbar spine x-rays show moderate spondylosis with disc space narrowing in several areas that could certainly be a contributor. Also have to consider fibromyalgia vs medication side effect. Continue diclofenac  75mg  BID. Adding tramadol  for moderate pain relief that is not addressed by other pain relief measures. Discussed possible lumbar MRI but will try other measures for now. Plan to hold atorvastatin  for 2-3 weeks to see if there is improvement in the lower  extremity symptoms. Patient to message me via MyChart if further intervention/evaluation is needed.  - traMADol  (ULTRAM ) 50 MG tablet; Take 1 tablet (50 mg total) by mouth every 8 (eight) hours as needed for moderate pain (pain score 4-6).  Dispense: 21 tablet; Refill: 0  4. Screening-pulmonary TB Checking quantiferon gold per GI recommendations.  - QuantiFERON-TB Gold Plus  5. Prediabetes Updating A1c today. Continue Wegovy  as prescribed.  - Hemoglobin A1c  6. Class 1 obesity due to excess calories with serious comorbidity and body mass index (BMI) of 33.0 to 33.9 in adult  Has been on Wegovy  for weight management since 2021 with a starting weight of 233lbs (BMI 40.03). She has lost a total of 43lbs (18.5% body weight loss) since starting the medication and continues to maintain her weight loss. Exercise is limited by her lower extremity pain but still tries to walk twice weekly. Following a reduced calorie diet and practicing healthy choices and portion control.   Return in about 6 months (around 07/19/2024) for chronic disease follow up.     Lanyla Costello, NP

## 2024-01-20 ENCOUNTER — Ambulatory Visit: Payer: Self-pay | Admitting: Medical-Surgical

## 2024-01-21 LAB — BASIC METABOLIC PANEL WITH GFR
BUN/Creatinine Ratio: 20 (ref 12–28)
BUN: 19 mg/dL (ref 8–27)
CO2: 26 mmol/L (ref 20–29)
Calcium: 9.8 mg/dL (ref 8.7–10.3)
Chloride: 101 mmol/L (ref 96–106)
Creatinine, Ser: 0.95 mg/dL (ref 0.57–1.00)
Glucose: 86 mg/dL (ref 70–99)
Potassium: 3.8 mmol/L (ref 3.5–5.2)
Sodium: 139 mmol/L (ref 134–144)
eGFR: 67 mL/min/1.73 (ref >59)

## 2024-01-21 LAB — QUANTIFERON-TB GOLD PLUS
QuantiFERON Mitogen Value: 8.74 [IU]/mL
QuantiFERON Nil Value: 0.01 [IU]/mL
QuantiFERON TB1 Ag Value: 0.02 [IU]/mL
QuantiFERON TB2 Ag Value: 0.02 [IU]/mL

## 2024-01-21 LAB — HEMOGLOBIN A1C
Est. average glucose Bld gHb Est-mCnc: 108 mg/dL
Hgb A1c MFr Bld: 5.4 % (ref 4.8–5.6)

## 2024-01-28 ENCOUNTER — Other Ambulatory Visit: Payer: Self-pay | Admitting: Medical-Surgical

## 2024-01-28 DIAGNOSIS — E785 Hyperlipidemia, unspecified: Secondary | ICD-10-CM

## 2024-02-04 MED ORDER — GABAPENTIN 300 MG PO CAPS: 300.0000 mg | ORAL_CAPSULE | Freq: Three times a day (TID) | ORAL | 3 refills | Status: DC

## 2024-02-12 ENCOUNTER — Ambulatory Visit (INDEPENDENT_AMBULATORY_CARE_PROVIDER_SITE_OTHER): Admitting: *Deleted

## 2024-02-12 VITALS — BP 118/85 | HR 75 | Temp 98.6°F | Resp 18 | Ht 63.0 in | Wt 191.0 lb

## 2024-02-12 DIAGNOSIS — K50018 Crohn's disease of small intestine with other complication: Secondary | ICD-10-CM | POA: Diagnosis not present

## 2024-02-12 MED ORDER — VEDOLIZUMAB 300 MG IV SOLR
300.0000 mg | Freq: Once | INTRAVENOUS | Status: AC
  Administered 2024-02-12: 300 mg via INTRAVENOUS
  Filled 2024-02-12: qty 5

## 2024-02-12 NOTE — Progress Notes (Signed)
 Diagnosis: Crohn's Disease  Provider:  Mannam, Praveen MD  Procedure: IV Infusion  IV Type: Peripheral, IV Location: L Forearm  Entyvio  (Vedolizumab ), Dose: 300 mg  Infusion Start Time: 0839  Infusion Stop Time: 0912  Post Infusion IV Care: Peripheral IV Discontinued  Discharge: Condition: Good, Destination: Home . AVS Declined  Performed by:  Mikah Rottinghaus E, RN

## 2024-03-11 ENCOUNTER — Encounter: Payer: Self-pay | Admitting: Medical-Surgical

## 2024-04-08 ENCOUNTER — Ambulatory Visit

## 2024-04-13 ENCOUNTER — Ambulatory Visit: Admitting: Medical-Surgical

## 2024-07-19 ENCOUNTER — Ambulatory Visit: Admitting: Medical-Surgical
